# Patient Record
Sex: Male | Born: 1955 | Race: White | Hispanic: No | Marital: Married | Smoking: Current every day smoker
Health system: Southern US, Community
[De-identification: ages and names within clinical notes are randomized; demographics above are authoritative.]

## PROBLEM LIST (undated history)

## (undated) DIAGNOSIS — E7800 Pure hypercholesterolemia, unspecified: Secondary | ICD-10-CM

## (undated) DIAGNOSIS — G4700 Insomnia, unspecified: Secondary | ICD-10-CM

## (undated) DIAGNOSIS — I10 Essential (primary) hypertension: Secondary | ICD-10-CM

## (undated) DIAGNOSIS — M549 Dorsalgia, unspecified: Secondary | ICD-10-CM

## (undated) DIAGNOSIS — F32A Depression, unspecified: Secondary | ICD-10-CM

## (undated) DIAGNOSIS — F329 Major depressive disorder, single episode, unspecified: Secondary | ICD-10-CM

## (undated) DIAGNOSIS — T8859XA Other complications of anesthesia, initial encounter: Secondary | ICD-10-CM

## (undated) DIAGNOSIS — K219 Gastro-esophageal reflux disease without esophagitis: Secondary | ICD-10-CM

## (undated) DIAGNOSIS — C61 Malignant neoplasm of prostate: Secondary | ICD-10-CM

## (undated) DIAGNOSIS — J189 Pneumonia, unspecified organism: Secondary | ICD-10-CM

## (undated) DIAGNOSIS — R51 Headache: Secondary | ICD-10-CM

## (undated) DIAGNOSIS — J449 Chronic obstructive pulmonary disease, unspecified: Secondary | ICD-10-CM

## (undated) DIAGNOSIS — M1990 Unspecified osteoarthritis, unspecified site: Secondary | ICD-10-CM

## (undated) DIAGNOSIS — R519 Headache, unspecified: Secondary | ICD-10-CM

## (undated) DIAGNOSIS — K589 Irritable bowel syndrome without diarrhea: Secondary | ICD-10-CM

## (undated) HISTORY — PX: CYSTECTOMY: SUR359

## (undated) HISTORY — DX: Pure hypercholesterolemia, unspecified: E78.00

## (undated) HISTORY — DX: Essential (primary) hypertension: I10

## (undated) HISTORY — PX: CARPAL TUNNEL RELEASE: SHX101

---

## 1999-01-30 ENCOUNTER — Other Ambulatory Visit: Admission: RE | Admit: 1999-01-30 | Discharge: 1999-01-30 | Payer: Self-pay | Admitting: Otolaryngology

## 2002-07-05 ENCOUNTER — Ambulatory Visit (HOSPITAL_COMMUNITY): Admission: RE | Admit: 2002-07-05 | Discharge: 2002-07-05 | Payer: Self-pay | Admitting: Family Medicine

## 2002-07-05 ENCOUNTER — Encounter: Payer: Self-pay | Admitting: Family Medicine

## 2003-02-22 ENCOUNTER — Ambulatory Visit (HOSPITAL_BASED_OUTPATIENT_CLINIC_OR_DEPARTMENT_OTHER): Admission: RE | Admit: 2003-02-22 | Discharge: 2003-02-22 | Payer: Self-pay | Admitting: Orthopedic Surgery

## 2004-10-15 ENCOUNTER — Ambulatory Visit (HOSPITAL_COMMUNITY): Admission: RE | Admit: 2004-10-15 | Discharge: 2004-10-15 | Payer: Self-pay | Admitting: Family Medicine

## 2004-11-20 ENCOUNTER — Ambulatory Visit (HOSPITAL_COMMUNITY): Admission: RE | Admit: 2004-11-20 | Discharge: 2004-11-20 | Payer: Self-pay | Admitting: Family Medicine

## 2009-02-09 ENCOUNTER — Ambulatory Visit (HOSPITAL_COMMUNITY): Admission: RE | Admit: 2009-02-09 | Discharge: 2009-02-09 | Payer: Self-pay | Admitting: Family Medicine

## 2010-03-21 ENCOUNTER — Ambulatory Visit (HOSPITAL_COMMUNITY): Admission: RE | Admit: 2010-03-21 | Discharge: 2010-03-21 | Payer: Self-pay | Admitting: Family Medicine

## 2010-04-03 ENCOUNTER — Ambulatory Visit (HOSPITAL_COMMUNITY): Admission: RE | Admit: 2010-04-03 | Discharge: 2010-04-03 | Payer: Self-pay | Admitting: Family Medicine

## 2010-08-02 ENCOUNTER — Ambulatory Visit (HOSPITAL_COMMUNITY): Admission: RE | Admit: 2010-08-02 | Discharge: 2010-08-02 | Payer: Self-pay | Admitting: Family Medicine

## 2011-01-28 ENCOUNTER — Institutional Professional Consult (permissible substitution): Payer: Self-pay | Admitting: Pulmonary Disease

## 2011-02-21 NOTE — Op Note (Signed)
   NAME:  [name], Roger Phillips                         ACCOUNT NO.:  [id number]   MEDICAL RECORD NO.:  [id number]                   PATIENT TYPE:  AMB   LOCATION:  DSC                                  FACILITY:  [facility]   PHYSICIAN:  Artist Pais. Mina Marble, M.D.           DATE OF BIRTH:  07/28/1956   DATE OF PROCEDURE:  02/22/2003  DATE OF DISCHARGE:                                 OPERATIVE REPORT   PREOPERATIVE DIAGNOSIS:  Right carpal tunnel syndrome.   POSTOPERATIVE DIAGNOSIS:  Right carpal tunnel syndrome.   PROCEDURE:  Right carpal tunnel release.   SURGEON:  Artist Pais. Mina Marble, M.D.   ASSISTANT:  Aura Fey. Bobbe Medico.   ANESTHESIA:  General anesthesia.   TOURNIQUET TIME:  11 minutes.   COMPLICATIONS:  None.   DRAINS:  None.   DESCRIPTION OF PROCEDURE:  The patient was taken to the operating room after  the induction of adequate general anesthesia, the right upper extremity was  prepped and draped in the usual sterile fashion.  Esmarch was used to  exsanguinate the limb.  Tourniquet was then inflated to 250 mmHg.  At this  point in time, a 2 cm incision was made in the palmar aspect of the right  hand in line with the long finger metacarpal, starting at Captain's cardinal  line.  Incision was taken down through the skin and subcutaneous tissues.  The palmar fascia was identified and split.  The distal edge of the  transverse carpal ligament as well as superficial palmar arch was  identified.  The superficial palmar arch was retracted distally and the  distal edge of the transverse carpal ligament was divided with the 15 blade,  thus exposing the underlying the median nerve and __________ of the carpal  canal.  A freer elevator was then used to __________ dorsal and volar  through the remaining aspect of the transverse carpal ligament which was  divided under direct vision.  The canal was inspected.  There were no osseus  lesions or ganglions present.  The wound was then loosely  closed with a  running 3-0 Prolene subcuticular stitch.  Steri-Strips, 4x4s and fluffs and  compressive hand dressing were applied.  The patient tolerated the procedure  well and went to the recovery room in stable condition.                                               Artist Pais Mina Marble, M.D.    MAW/MEDQ  D:  02/22/2003  T:  02/22/2003  Job:  [id number]

## 2011-05-05 ENCOUNTER — Other Ambulatory Visit: Payer: Self-pay | Admitting: Family Medicine

## 2011-05-05 ENCOUNTER — Ambulatory Visit (HOSPITAL_COMMUNITY)
Admission: RE | Admit: 2011-05-05 | Discharge: 2011-05-05 | Disposition: A | Discharge status: Home / Self Care | Payer: PRIVATE HEALTH INSURANCE | Source: Ambulatory Visit | Attending: Family Medicine | Admitting: Family Medicine

## 2011-05-05 DIAGNOSIS — J4489 Other specified chronic obstructive pulmonary disease: Secondary | ICD-10-CM | POA: Insufficient documentation

## 2011-05-05 DIAGNOSIS — J449 Chronic obstructive pulmonary disease, unspecified: Secondary | ICD-10-CM

## 2011-05-05 DIAGNOSIS — R0602 Shortness of breath: Secondary | ICD-10-CM | POA: Insufficient documentation

## 2011-05-08 ENCOUNTER — Encounter: Payer: Self-pay | Admitting: Pulmonary Disease

## 2011-05-08 ENCOUNTER — Ambulatory Visit (INDEPENDENT_AMBULATORY_CARE_PROVIDER_SITE_OTHER): Payer: PRIVATE HEALTH INSURANCE | Admitting: Pulmonary Disease

## 2011-05-08 VITALS — BP 130/80 | HR 73 | Temp 98.5°F | Ht 72.0 in | Wt 232.2 lb

## 2011-05-08 DIAGNOSIS — R042 Hemoptysis: Secondary | ICD-10-CM

## 2011-05-08 DIAGNOSIS — Z720 Tobacco use: Secondary | ICD-10-CM

## 2011-05-08 DIAGNOSIS — J984 Other disorders of lung: Secondary | ICD-10-CM

## 2011-05-08 DIAGNOSIS — R911 Solitary pulmonary nodule: Secondary | ICD-10-CM

## 2011-05-08 DIAGNOSIS — R059 Cough, unspecified: Secondary | ICD-10-CM

## 2011-05-08 DIAGNOSIS — R05 Cough: Secondary | ICD-10-CM

## 2011-05-08 DIAGNOSIS — F17200 Nicotine dependence, unspecified, uncomplicated: Secondary | ICD-10-CM

## 2011-05-08 NOTE — Progress Notes (Signed)
  Subjective:    Patient ID: Roger Phillips, male    DOB: 01-25-1956, 55 y.o.   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 Accomapnied by wife, [name] who is CNA at [employer]. Pulm nodules & 1.2 cm subcarinal LN were first noted on a CT scan in 6/11. ON a FU CT in oct'11, the LN had decresaed in size & the LUL nodule had resolved, others were stable. He reports blood tinged sputum on occasion over the past few months, last such episode 2 wks ago. His brother had throat cancer, he denies change in voice, hoarseness or sensation of a lump. He has been on adviar & ventolin, spiriva was recetly added, completed course of cipro & prednisone. With chantix, he smokes 1 PPd, down from 1.5 ppD Reports breathing heavy, worse in the heat, walking in the parking lot. Coughs all the time , mostly yellow, occ brown He binge drinks on the weekends & hemoptysis is worse after this. Spirometry showed FEv1 53% (2.1) & FVC of 55%, ratio 75. CXR /30/12 was wnl CBC showed polycythemia with Hb 17   Review of Systems  Constitutional: Positive for unexpected weight change. Negative for fever.  HENT: Positive for congestion and dental problem. Negative for ear pain, nosebleeds, sore throat, rhinorrhea, sneezing, trouble swallowing, postnasal drip and sinus pressure.   Eyes: Negative for redness and itching.  Respiratory: Positive for cough and shortness of breath. Negative for chest tightness and wheezing.   Cardiovascular: Positive for chest pain and leg swelling. Negative for palpitations.  Gastrointestinal: Negative for nausea and vomiting.  Genitourinary: Negative for dysuria.  Musculoskeletal: Negative for joint swelling.  Skin: Negative for rash.  Neurological: Negative for headaches.  Hematological: Does not bruise/bleed easily.  Psychiatric/Behavioral: Negative for dysphoric mood. The patient is not nervous/anxious.        Objective:    Physical Exam Gen. Pleasant, ruddy face, in no distress, normal affect, deep voice ENT - no lesions, no post nasal drip, no oral cause of bleeding, edentulous Neck: No JVD, no thyromegaly, no carotid bruits Lungs: no use of accessory muscles, no dullness to percussion, decreased without rales or rhonchi  Cardiovascular: Rhythm regular, heart sounds  normal, no murmurs or gallops, no peripheral edema Abdomen: soft and non-tender, no hepatosplenomegaly, BS normal. Musculoskeletal: No deformities, no cyanosis or clubbing Neuro:  alert, non focal        Assessment & Plan:

## 2011-05-08 NOTE — Patient Instructions (Signed)
Breathing test Ct scan of chest at [facility] Bronchoscopy next week - nothing to eat after MN the night before

## 2011-05-09 ENCOUNTER — Ambulatory Visit (HOSPITAL_COMMUNITY)
Admission: RE | Admit: 2011-05-09 | Discharge: 2011-05-09 | Disposition: A | Discharge status: Home / Self Care | Payer: PRIVATE HEALTH INSURANCE | Source: Ambulatory Visit | Attending: Pulmonary Disease | Admitting: Pulmonary Disease

## 2011-05-09 DIAGNOSIS — Z720 Tobacco use: Secondary | ICD-10-CM | POA: Insufficient documentation

## 2011-05-09 DIAGNOSIS — R911 Solitary pulmonary nodule: Secondary | ICD-10-CM | POA: Insufficient documentation

## 2011-05-09 DIAGNOSIS — J984 Other disorders of lung: Secondary | ICD-10-CM | POA: Insufficient documentation

## 2011-05-09 DIAGNOSIS — R042 Hemoptysis: Secondary | ICD-10-CM | POA: Insufficient documentation

## 2011-05-09 DIAGNOSIS — R05 Cough: Secondary | ICD-10-CM

## 2011-05-09 NOTE — Assessment & Plan Note (Signed)
Needless to say, smoking cessation paramount intervention here.

## 2011-05-09 NOTE — Assessment & Plan Note (Signed)
Rpt CT chest for stability May need FU x 2 yrs before concluding benign. Sub carinal Ln from 6/11 decreased on FU in 10/11- reassuring.

## 2011-05-09 NOTE — Assessment & Plan Note (Signed)
No obvious upper airway source. Will proceed with inspection bscopy after CT, planned for next week.

## 2011-05-13 ENCOUNTER — Ambulatory Visit (HOSPITAL_COMMUNITY)
Admission: RE | Admit: 2011-05-13 | Discharge: 2011-05-13 | Disposition: A | Discharge status: Home / Self Care | Payer: PRIVATE HEALTH INSURANCE | Source: Ambulatory Visit | Attending: Pulmonary Disease | Admitting: Pulmonary Disease

## 2011-05-13 DIAGNOSIS — R042 Hemoptysis: Secondary | ICD-10-CM | POA: Insufficient documentation

## 2011-05-13 DIAGNOSIS — J984 Other disorders of lung: Secondary | ICD-10-CM | POA: Insufficient documentation

## 2011-05-13 DIAGNOSIS — F17200 Nicotine dependence, unspecified, uncomplicated: Secondary | ICD-10-CM | POA: Insufficient documentation

## 2011-05-18 NOTE — Op Note (Signed)
  NAMEBUCK, MCAFFEE NO.:  [id number]  MEDICAL RECORD NO.:  [id number]  LOCATION:  RESP                         FACILITY:  [facility]  PHYSICIAN:  Oretha Milch, MD      DATE OF BIRTH:  03/03/1956  DATE OF PROCEDURE:  05/13/2011 DATE OF DISCHARGE:                              OPERATIVE REPORT   PROCEDURE PERFORMED:  Bronchoscopy.  INDICATION FOR THE PROCEDURE:  Hemoptysis.  PROCEDURE NOTE:  Written informed consent was obtained from the patient prior to procedure.  Risks of procedure including coughing, bleeding, and a small chance of lung puncture requiring a chest tube were discussed with the patient in detail and he evidenced understanding.  Versed 5 mg and 125 mcg of fentanyl were used in divided doses.  During the procedure, bronchoscope was inserted from the right naris.  Upper airway appeared normal.  Vocal cords showed normal appearance and motion.  Tracheobronchial tree was examined to the subsegmental level. The mucosa appeared mildly inflamed.  No source of hemoptysis was identified.  No endobronchial lesions were noted.  The bronchoscope was then withdrawn and reinserted through the left naris.  No source of hemoptysis was noted.  Nasal bleeding was noted here to.  The patient tolerated procedure well.  10 cc of 2% lidocaine and 8 cc of 1% lidocaine were used during the procedure.     Oretha Milch, MD     RVA/MEDQ  D:  05/13/2011  T:  05/13/2011  Job:  [id number]  Electronically Signed by Cyril Mourning MD on 05/18/2011 09:33:22 PM

## 2011-05-23 ENCOUNTER — Ambulatory Visit: Payer: PRIVATE HEALTH INSURANCE | Admitting: Pulmonary Disease

## 2011-06-16 ENCOUNTER — Ambulatory Visit: Payer: PRIVATE HEALTH INSURANCE | Admitting: Pulmonary Disease

## 2011-07-04 ENCOUNTER — Ambulatory Visit (INDEPENDENT_AMBULATORY_CARE_PROVIDER_SITE_OTHER): Payer: PRIVATE HEALTH INSURANCE | Admitting: Pulmonary Disease

## 2011-07-04 ENCOUNTER — Encounter: Payer: Self-pay | Admitting: Pulmonary Disease

## 2011-07-04 VITALS — BP 132/90 | HR 65 | Temp 98.1°F | Ht 72.0 in | Wt 235.0 lb

## 2011-07-04 DIAGNOSIS — Z23 Encounter for immunization: Secondary | ICD-10-CM

## 2011-07-04 DIAGNOSIS — J984 Other disorders of lung: Secondary | ICD-10-CM

## 2011-07-04 DIAGNOSIS — J449 Chronic obstructive pulmonary disease, unspecified: Secondary | ICD-10-CM

## 2011-07-04 DIAGNOSIS — R911 Solitary pulmonary nodule: Secondary | ICD-10-CM

## 2011-07-04 DIAGNOSIS — R042 Hemoptysis: Secondary | ICD-10-CM

## 2011-07-04 NOTE — Progress Notes (Signed)
  Subjective:    Patient ID: Roger Phillips, male    DOB: March 06, 1956, 55 y.o.   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  Accompanied by wife, Roger Phillips who is CNA at [employer].    He reported blood tinged sputum on occasion over the past few months, last such episode 2 wks ago. His brother had throat cancer, he denies change in voice, hoarseness or sensation of a lump.  He has been on adviar & ventolin, spiriva was recetly added, completed course of cipro & prednisone.  With chantix, he smokes 1 PPd, down from 1.5 ppD  He binge drinks on the weekends & hemoptysis is worse after this.  Spirometry showed FEv1 53% (2.1) & FVC of 55%, ratio 75.  CXR /30/12 was wnl  CBC showed polycythemia with Hb 17 Bscopy - no endobronchial lesions Pulm nodules & 1.2 cm subcarinal LN were first noted on a CT scan in 6/11. ON a FU CT in oct'11, the LN had decresaed in size & the LUL nodule had resolved, others were stable. CT 8/12 >>Previously seen right upper lobe pulmonary nodule is slightly less conspicuous, 4 mm current versus 5 mm previous. Additional tiny questionable nodular foci in both lungs are also  unchanged, with two additional tiny questionable nodules at right upper right lower lobes not definitely seen on previous study  07/04/2011 Continues to smoke 1 PPD, now off chantix. Discussed nicotine supplements. Discussed bscopy  & CT scan results   Review of Systems Pt denies any significant  nasal congestion or excess secretions, fever, chills, sweats, unintended wt loss, pleuritic or exertional cp, orthopnea pnd or leg swelling.  Pt also denies any obvious fluctuation in symptoms with weather or environmental change or other alleviating or aggravating factors.    Pt denies any increase in rescue therapy over baseline, denies waking up needing it or having early am exacerbations or coughing/wheezing/ or dyspnea      Objective:   Physical  Exam  Gen. Pleasant, well-nourished, in no distress, normal affect ENT - no lesions, no post nasal drip Neck: No JVD, no thyromegaly, no carotid bruits Lungs: no use of accessory muscles, no dullness to percussion,decreased without rales or rhonchi  Cardiovascular: Rhythm regular, heart sounds  normal, no murmurs or gallops, no peripheral edema Abdomen: soft and non-tender, no hepatosplenomegaly, BS normal. Musculoskeletal: No deformities, no cyanosis or clubbing Neuro:  alert, non focal       Assessment & Plan:

## 2011-07-04 NOTE — Assessment & Plan Note (Signed)
Ct advair,spiriva Albuterol prn only

## 2011-07-04 NOTE — Patient Instructions (Signed)
You HAVE to quit smoking !! Try nicotine patches 21 mg daily CT scan in 6 months to follow up on spot in your lung Flu shot

## 2011-07-04 NOTE — Assessment & Plan Note (Signed)
FU CT in 6mnths 

## 2011-10-30 ENCOUNTER — Other Ambulatory Visit: Payer: Self-pay | Admitting: Family Medicine

## 2011-10-30 DIAGNOSIS — R29898 Other symptoms and signs involving the musculoskeletal system: Secondary | ICD-10-CM

## 2011-11-06 ENCOUNTER — Ambulatory Visit (HOSPITAL_COMMUNITY)
Admission: RE | Admit: 2011-11-06 | Discharge: 2011-11-06 | Disposition: A | Discharge status: Home / Self Care | Payer: PRIVATE HEALTH INSURANCE | Source: Ambulatory Visit | Attending: Family Medicine | Admitting: Family Medicine

## 2011-11-06 DIAGNOSIS — M542 Cervicalgia: Secondary | ICD-10-CM | POA: Insufficient documentation

## 2011-11-06 DIAGNOSIS — R29898 Other symptoms and signs involving the musculoskeletal system: Secondary | ICD-10-CM

## 2011-11-06 DIAGNOSIS — M79609 Pain in unspecified limb: Secondary | ICD-10-CM | POA: Insufficient documentation

## 2011-11-06 DIAGNOSIS — M5020 Other cervical disc displacement, unspecified cervical region: Secondary | ICD-10-CM | POA: Insufficient documentation

## 2011-11-06 DIAGNOSIS — M5380 Other specified dorsopathies, site unspecified: Secondary | ICD-10-CM | POA: Insufficient documentation

## 2011-11-27 ENCOUNTER — Telehealth: Payer: Self-pay | Admitting: Pulmonary Disease

## 2011-11-27 ENCOUNTER — Other Ambulatory Visit: Payer: Self-pay | Admitting: Neurosurgery

## 2011-11-27 NOTE — Telephone Encounter (Signed)
lmomtcb x1 

## 2011-11-27 NOTE — Telephone Encounter (Signed)
Called and spoke with Darl Pikes from with Dr. Antony Contras office.  She states pt is needing surgical clearance.  Pt is scheduled next week 2/28 for two level cervical fusion on general anesthesia.  Pt last saw RA 07/04/11.  RA, please advise if ok for surgery or does pt need to be seen in our office first prior to surgery?  Please advise.  Thanks!

## 2011-11-27 NOTE — Telephone Encounter (Signed)
Pl arrange OV with me next week for pre-op clearance - ok to double book

## 2011-11-28 NOTE — Telephone Encounter (Signed)
Dr. Vassie Loll has an opening on 12/02/11 at 2:30 at [facility] office -- Spoke with Darl Pikes with Dr. Antony Contras office.  She is ok with this appt date and time and will inform pt.  Nothing further needed.

## 2011-12-01 ENCOUNTER — Encounter (HOSPITAL_COMMUNITY): Payer: Self-pay | Admitting: Pharmacy Technician

## 2011-12-02 ENCOUNTER — Encounter: Payer: Self-pay | Admitting: Pulmonary Disease

## 2011-12-02 ENCOUNTER — Ambulatory Visit (INDEPENDENT_AMBULATORY_CARE_PROVIDER_SITE_OTHER): Payer: PRIVATE HEALTH INSURANCE | Admitting: Pulmonary Disease

## 2011-12-02 ENCOUNTER — Ambulatory Visit (INDEPENDENT_AMBULATORY_CARE_PROVIDER_SITE_OTHER)
Admission: RE | Admit: 2011-12-02 | Discharge: 2011-12-02 | Disposition: A | Discharge status: Home / Self Care | Payer: PRIVATE HEALTH INSURANCE | Source: Ambulatory Visit | Attending: Pulmonary Disease | Admitting: Pulmonary Disease

## 2011-12-02 VITALS — BP 142/90 | HR 81 | Temp 98.8°F | Ht 72.0 in | Wt 244.2 lb

## 2011-12-02 DIAGNOSIS — R911 Solitary pulmonary nodule: Secondary | ICD-10-CM

## 2011-12-02 DIAGNOSIS — R05 Cough: Secondary | ICD-10-CM

## 2011-12-02 DIAGNOSIS — J449 Chronic obstructive pulmonary disease, unspecified: Secondary | ICD-10-CM

## 2011-12-02 NOTE — Progress Notes (Signed)
  Subjective:    Patient ID: Roger Phillips, male    DOB: [birth year], 56 y.o.   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  Accompanied by wife, Roger Phillips who is CNA at [employer].  He reported blood tinged sputum on occasion over the past few months, last such episode 2 wks ago. His brother had throat cancer, he denies change in voice, hoarseness or sensation of a lump.  He has been on adviar & ventolin, spiriva was recetly added, completed course of cipro & prednisone.  With chantix, he smokes 1 PPd, down from 1.5 ppD  He binge drinks on the weekends & hemoptysis is worse after this.  Spirometry showed FEv1 53% (2.1) & FVC of 55%, ratio 75.  CXR /30/12 was wnl  CBC showed polycythemia with Hb 17  Bscopy - no endobronchial lesions  Pulm nodules & 1.2 cm subcarinal LN were first noted on a CT scan in 6/11. ON a FU CT in oct'11, the LN had decresaed in size & the LUL nodule had resolved, others were stable.  CT 8/12 >>Previously seen right upper lobe pulmonary nodule is slightly less conspicuous, 4 mm current versus 5 mm previous. Additional tiny questionable nodular foci in both lungs are also  unchanged, with two additional tiny questionable nodules at right upper right lower lobes not definitely seen on previous study   12/02/2011 Pre-op clearance for cervical spine decompression surgery 2/28 - Dr Newell Coral He repors sinus drainage - was given z-pak & hydromet cough syrup by PCP. He has self dc'd spiriva Continues to smoke 1/2 PPD, has cut down on alcohol - no h/s/o withdrawal No nocturnal wheeze, chest pain, orthopnea or PND Reports clear phlegm CXR - no new changes    Review of Systems Patient denies significant dyspnea,cough, hemoptysis,  chest pain, palpitations, pedal edema, orthopnea, paroxysmal nocturnal dyspnea, lightheadedness, nausea, vomiting, abdominal or  leg pains      Objective:   Physical Exam  Gen. Pleasant,  well-nourished, in no distress, normal affect ENT - no lesions, no post nasal drip, clear nasal discharge Neck: No JVD, no thyromegaly, no carotid bruits Lungs: no use of accessory muscles, no dullness to percussion, decreased without rales or rhonchi  Cardiovascular: Rhythm regular, heart sounds  normal, no murmurs or gallops, no peripheral edema Abdomen: soft and non-tender, no hepatosplenomegaly, BS normal. Musculoskeletal: No deformities, no cyanosis or clubbing Neuro:  alert, non focal       Assessment & Plan:

## 2011-12-02 NOTE — Assessment & Plan Note (Signed)
Rpt CT planned for 3/13

## 2011-12-02 NOTE — Assessment & Plan Note (Signed)
He is cleared for surgery - remains at moderate risk for post op pulmonary complications such as atelectasis, hypoxia & pneumonia Nasonex sample - 1 spray each nare  At bedtime Stay on advair until day of surgery Hold lisinopril 1 day prior & until after surgery Place on albuterol/ atrovent nebs q 6h & q 4h prn post -op Early mobilisation & DVT prophylaxis as would be routine for such surgery. He should be watched for alcohol withdrawal post op although he denies ever having such an episode.

## 2011-12-02 NOTE — Patient Instructions (Signed)
Pre-op CXR today Nasonex sample - 1 spray each nare  At bedtime Stay on advair until day of surgery Will place on nebuliser medications post -op

## 2011-12-03 ENCOUNTER — Telehealth: Payer: Self-pay | Admitting: Pulmonary Disease

## 2011-12-03 ENCOUNTER — Encounter (HOSPITAL_COMMUNITY)
Admission: RE | Admit: 2011-12-03 | Discharge: 2011-12-03 | Disposition: A | Discharge status: Home / Self Care | Payer: PRIVATE HEALTH INSURANCE | Source: Ambulatory Visit | Attending: Neurosurgery | Admitting: Neurosurgery

## 2011-12-03 ENCOUNTER — Other Ambulatory Visit: Payer: Self-pay | Admitting: *Deleted

## 2011-12-03 ENCOUNTER — Other Ambulatory Visit: Payer: Self-pay

## 2011-12-03 ENCOUNTER — Encounter (HOSPITAL_COMMUNITY): Payer: Self-pay

## 2011-12-03 DIAGNOSIS — J449 Chronic obstructive pulmonary disease, unspecified: Secondary | ICD-10-CM

## 2011-12-03 HISTORY — DX: Pneumonia, unspecified organism: J18.9

## 2011-12-03 HISTORY — DX: Insomnia, unspecified: G47.00

## 2011-12-03 HISTORY — DX: Irritable bowel syndrome, unspecified: K58.9

## 2011-12-03 HISTORY — DX: Gastro-esophageal reflux disease without esophagitis: K21.9

## 2011-12-03 HISTORY — DX: Unspecified osteoarthritis, unspecified site: M19.90

## 2011-12-03 HISTORY — DX: Depression, unspecified: F32.A

## 2011-12-03 HISTORY — DX: Major depressive disorder, single episode, unspecified: F32.9

## 2011-12-03 HISTORY — DX: Dorsalgia, unspecified: M54.9

## 2011-12-03 HISTORY — DX: Headache: R51

## 2011-12-03 HISTORY — DX: Headache, unspecified: R51.9

## 2011-12-03 HISTORY — DX: Chronic obstructive pulmonary disease, unspecified: J44.9

## 2011-12-03 LAB — CBC
Hemoglobin: 16.7 g/dL (ref 13.0–17.0)
MCH: 31 pg (ref 26.0–34.0)
MCV: 86.6 fL (ref 78.0–100.0)
RBC: 5.38 MIL/uL (ref 4.22–5.81)

## 2011-12-03 LAB — BASIC METABOLIC PANEL
CO2: 28 mEq/L (ref 19–32)
Calcium: 9.7 mg/dL (ref 8.4–10.5)
Glucose, Bld: 133 mg/dL — ABNORMAL HIGH (ref 70–99)
Potassium: 3.8 mEq/L (ref 3.5–5.1)
Sodium: 136 mEq/L (ref 135–145)

## 2011-12-03 LAB — SURGICAL PCR SCREEN
MRSA, PCR: NEGATIVE
Staphylococcus aureus: NEGATIVE

## 2011-12-03 MED ORDER — ALBUTEROL SULFATE (2.5 MG/3ML) 0.083% IN NEBU: 2.5000 mg | INHALATION_SOLUTION | RESPIRATORY_TRACT | Status: DC | PRN

## 2011-12-03 MED ORDER — CEFAZOLIN SODIUM-DEXTROSE 2-3 GM-% IV SOLR
2.0000 g | INTRAVENOUS | Status: AC
  Administered 2011-12-04: 2 g via INTRAVENOUS
  Filled 2011-12-03: qty 50

## 2011-12-03 MED ORDER — IPRATROPIUM-ALBUTEROL 0.5-2.5 (3) MG/3ML IN SOLN: 3.0000 mL | Freq: Four times a day (QID) | RESPIRATORY_TRACT | Status: DC

## 2011-12-03 NOTE — Telephone Encounter (Signed)
Asked MR to clarify RA's neb medication from 12/02/11 note and he advised neb medication is for DuoNeb every 6 hours on schedule and then albuterol every 4 hour prn. Rx sent to pharmacy.  Pt aware, pharmacist aware. Pt has neb machine and note faxed to Dr. Mearl Latin office attn Darl Pikes [phone], [phone].

## 2011-12-03 NOTE — Telephone Encounter (Signed)
Medication has been sent in, pt and pharmacist aware.

## 2011-12-03 NOTE — Pre-Procedure Instructions (Signed)
20 Roger Phillips  12/03/2011   Your procedure is scheduled on:  Thurs, Feb 28 @ 0730  Report to [facility] at 0530 AM.  Call this number if you have problems the morning of surgery: [phone]   Remember:   Do not eat food:After Midnight.  May have clear liquids: up to 4 Hours before arrival.(until 1:30 am)  Clear liquids include soda, tea, black coffee, apple or grape juice, broth.  Take these medicines the morning of surgery with A SIP OF WATER: Amlodipine,Wellbutrin,Prilosec,Pain Pill(if needed),Albuterol and Spiriva<Bring your inhalers with you>   Do not wear jewelry, make-up or nail polish.  Do not wear lotions, powders, or perfumes. You may wear deodorant.  Do not shave 48 hours prior to surgery.  Do not bring valuables to the hospital.  Contacts, dentures or bridgework may not be worn into surgery.  Leave suitcase in the car. After surgery it may be brought to your room.  For patients admitted to the hospital, checkout time is 11:00 AM the day of discharge.   Patients discharged the day of surgery will not be allowed to drive home.  Name and phone number of your driver:   Special Instructions: CHG Shower Use Special Wash: 1/2 bottle night before surgery and 1/2 bottle morning of surgery.   Please read over the following fact sheets that you were given: Pain Booklet, Coughing and Deep Breathing, MRSA Information and Surgical Site Infection Prevention

## 2011-12-03 NOTE — Progress Notes (Signed)
Pt doesn't have a cardiologist;medical MD-Dr.Scott Luking manages HTN and HYperlipidemia  Denies having a stress test/echo/heart cath

## 2011-12-04 ENCOUNTER — Ambulatory Visit (HOSPITAL_COMMUNITY)
Admission: RE | Admit: 2011-12-04 | Discharge: 2011-12-05 | Disposition: A | Discharge status: Home / Self Care | Payer: PRIVATE HEALTH INSURANCE | Source: Ambulatory Visit | Attending: Neurosurgery | Admitting: Neurosurgery

## 2011-12-04 ENCOUNTER — Ambulatory Visit (HOSPITAL_COMMUNITY): Payer: PRIVATE HEALTH INSURANCE | Admitting: Anesthesiology

## 2011-12-04 ENCOUNTER — Encounter (HOSPITAL_COMMUNITY): Payer: Self-pay | Admitting: Anesthesiology

## 2011-12-04 ENCOUNTER — Encounter (HOSPITAL_COMMUNITY)
Admission: RE | Disposition: A | Discharge status: Home / Self Care | Payer: Self-pay | Source: Ambulatory Visit | Attending: Neurosurgery

## 2011-12-04 ENCOUNTER — Ambulatory Visit (HOSPITAL_COMMUNITY): Payer: PRIVATE HEALTH INSURANCE

## 2011-12-04 DIAGNOSIS — K219 Gastro-esophageal reflux disease without esophagitis: Secondary | ICD-10-CM | POA: Insufficient documentation

## 2011-12-04 DIAGNOSIS — Z01810 Encounter for preprocedural cardiovascular examination: Secondary | ICD-10-CM | POA: Insufficient documentation

## 2011-12-04 DIAGNOSIS — M4712 Other spondylosis with myelopathy, cervical region: Secondary | ICD-10-CM | POA: Insufficient documentation

## 2011-12-04 DIAGNOSIS — F17200 Nicotine dependence, unspecified, uncomplicated: Secondary | ICD-10-CM | POA: Insufficient documentation

## 2011-12-04 DIAGNOSIS — Z79899 Other long term (current) drug therapy: Secondary | ICD-10-CM | POA: Insufficient documentation

## 2011-12-04 DIAGNOSIS — Z01812 Encounter for preprocedural laboratory examination: Secondary | ICD-10-CM | POA: Insufficient documentation

## 2011-12-04 DIAGNOSIS — M5000 Cervical disc disorder with myelopathy, unspecified cervical region: Secondary | ICD-10-CM | POA: Insufficient documentation

## 2011-12-04 DIAGNOSIS — E7800 Pure hypercholesterolemia, unspecified: Secondary | ICD-10-CM | POA: Insufficient documentation

## 2011-12-04 DIAGNOSIS — J4489 Other specified chronic obstructive pulmonary disease: Secondary | ICD-10-CM | POA: Insufficient documentation

## 2011-12-04 DIAGNOSIS — I10 Essential (primary) hypertension: Secondary | ICD-10-CM | POA: Insufficient documentation

## 2011-12-04 DIAGNOSIS — J449 Chronic obstructive pulmonary disease, unspecified: Secondary | ICD-10-CM | POA: Insufficient documentation

## 2011-12-04 DIAGNOSIS — E119 Type 2 diabetes mellitus without complications: Secondary | ICD-10-CM | POA: Insufficient documentation

## 2011-12-04 HISTORY — PX: ANTERIOR CERVICAL DECOMP/DISCECTOMY FUSION: SHX1161

## 2011-12-04 LAB — GLUCOSE, CAPILLARY
Glucose-Capillary: 140 mg/dL — ABNORMAL HIGH (ref 70–99)
Glucose-Capillary: 171 mg/dL — ABNORMAL HIGH (ref 70–99)
Glucose-Capillary: 182 mg/dL — ABNORMAL HIGH (ref 70–99)
Glucose-Capillary: 189 mg/dL — ABNORMAL HIGH (ref 70–99)
Glucose-Capillary: 202 mg/dL — ABNORMAL HIGH (ref 70–99)

## 2011-12-04 SURGERY — ANTERIOR CERVICAL DECOMPRESSION/DISCECTOMY FUSION 2 LEVELS
Anesthesia: General | Site: Neck | Wound class: Clean

## 2011-12-04 MED ORDER — ZOLPIDEM TARTRATE 5 MG PO TABS: 10.0000 mg | ORAL_TABLET | Freq: Every evening | ORAL | Status: DC | PRN

## 2011-12-04 MED ORDER — OXYCODONE-ACETAMINOPHEN 5-325 MG PO TABS: 1.0000 | ORAL_TABLET | ORAL | Status: DC | PRN

## 2011-12-04 MED ORDER — DIPHENHYDRAMINE HCL 25 MG PO TABS
25.0000 mg | ORAL_TABLET | Freq: Four times a day (QID) | ORAL | Status: DC
  Filled 2011-12-04 (×8): qty 1

## 2011-12-04 MED ORDER — INSULIN ASPART 100 UNIT/ML ~~LOC~~ SOLN
0.0000 [IU] | SUBCUTANEOUS | Status: DC
  Administered 2011-12-04 – 2011-12-05 (×5): 3 [IU] via SUBCUTANEOUS
  Administered 2011-12-05: 5 [IU] via SUBCUTANEOUS
  Filled 2011-12-04: qty 3

## 2011-12-04 MED ORDER — ALBUTEROL SULFATE HFA 108 (90 BASE) MCG/ACT IN AERS
2.0000 | INHALATION_SPRAY | RESPIRATORY_TRACT | Status: DC | PRN
  Administered 2011-12-04: 5 via RESPIRATORY_TRACT
  Filled 2011-12-04: qty 6.7

## 2011-12-04 MED ORDER — HYDROXYZINE HCL 50 MG/ML IM SOLN: 50.0000 mg | INTRAMUSCULAR | Status: DC | PRN

## 2011-12-04 MED ORDER — SODIUM CHLORIDE 0.9 % IJ SOLN: 3.0000 mL | INTRAMUSCULAR | Status: DC | PRN

## 2011-12-04 MED ORDER — MORPHINE SULFATE 4 MG/ML IJ SOLN: 0.0500 mg/kg | INTRAMUSCULAR | Status: DC | PRN

## 2011-12-04 MED ORDER — ALBUTEROL SULFATE (5 MG/ML) 0.5% IN NEBU
2.5000 mg | INHALATION_SOLUTION | Freq: Four times a day (QID) | RESPIRATORY_TRACT | Status: DC
  Administered 2011-12-05 (×2): 2.5 mg via RESPIRATORY_TRACT
  Filled 2011-12-04 (×2): qty 0.5

## 2011-12-04 MED ORDER — SODIUM CHLORIDE 0.9 % IJ SOLN
3.0000 mL | Freq: Two times a day (BID) | INTRAMUSCULAR | Status: DC
  Administered 2011-12-04: 3 mL via INTRAVENOUS

## 2011-12-04 MED ORDER — SIMVASTATIN 10 MG PO TABS
10.0000 mg | ORAL_TABLET | Freq: Every day | ORAL | Status: DC
  Filled 2011-12-04 (×3): qty 1

## 2011-12-04 MED ORDER — AMLODIPINE BESYLATE 5 MG PO TABS
5.0000 mg | ORAL_TABLET | Freq: Every day | ORAL | Status: DC
  Filled 2011-12-04 (×2): qty 1

## 2011-12-04 MED ORDER — TIOTROPIUM BROMIDE MONOHYDRATE 18 MCG IN CAPS
18.0000 ug | ORAL_CAPSULE | Freq: Every day | RESPIRATORY_TRACT | Status: DC
  Filled 2011-12-04: qty 5

## 2011-12-04 MED ORDER — POTASSIUM CHLORIDE IN NACL 20-0.9 MEQ/L-% IV SOLN
INTRAVENOUS | Status: DC
  Administered 2011-12-04 – 2011-12-05 (×2): via INTRAVENOUS
  Filled 2011-12-04 (×4): qty 1000

## 2011-12-04 MED ORDER — 0.9 % SODIUM CHLORIDE (POUR BTL) OPTIME
TOPICAL | Status: DC | PRN
  Administered 2011-12-04: 1000 mL

## 2011-12-04 MED ORDER — FAMOTIDINE IN NACL 20-0.9 MG/50ML-% IV SOLN
20.0000 mg | Freq: Two times a day (BID) | INTRAVENOUS | Status: DC
  Administered 2011-12-04 – 2011-12-05 (×3): 20 mg via INTRAVENOUS
  Filled 2011-12-04 (×4): qty 50

## 2011-12-04 MED ORDER — NEOSTIGMINE METHYLSULFATE 1 MG/ML IJ SOLN
INTRAMUSCULAR | Status: DC | PRN
  Administered 2011-12-04: 4 mg via INTRAVENOUS

## 2011-12-04 MED ORDER — HYDROMORPHONE HCL PF 1 MG/ML IJ SOLN: 0.2500 mg | INTRAMUSCULAR | Status: DC | PRN

## 2011-12-04 MED ORDER — ONDANSETRON HCL 4 MG/2ML IJ SOLN
INTRAMUSCULAR | Status: DC | PRN
  Administered 2011-12-04: 4 mg via INTRAVENOUS

## 2011-12-04 MED ORDER — ACETAMINOPHEN 650 MG RE SUPP: 650.0000 mg | RECTAL | Status: DC | PRN

## 2011-12-04 MED ORDER — MAGNESIUM HYDROXIDE 400 MG/5ML PO SUSP: 30.0000 mL | Freq: Every day | ORAL | Status: DC | PRN

## 2011-12-04 MED ORDER — MORPHINE SULFATE 4 MG/ML IJ SOLN
4.0000 mg | INTRAMUSCULAR | Status: DC | PRN
  Administered 2011-12-04: 4 mg via INTRAMUSCULAR
  Filled 2011-12-04: qty 1

## 2011-12-04 MED ORDER — HYDROXYZINE HCL 25 MG PO TABS: 50.0000 mg | ORAL_TABLET | ORAL | Status: DC | PRN

## 2011-12-04 MED ORDER — HYDROCODONE-ACETAMINOPHEN 5-325 MG PO TABS: 1.0000 | ORAL_TABLET | ORAL | Status: DC | PRN

## 2011-12-04 MED ORDER — ALUM & MAG HYDROXIDE-SIMETH 200-200-20 MG/5ML PO SUSP: 30.0000 mL | Freq: Four times a day (QID) | ORAL | Status: DC | PRN

## 2011-12-04 MED ORDER — PANTOPRAZOLE SODIUM 40 MG PO TBEC: 40.0000 mg | DELAYED_RELEASE_TABLET | Freq: Every day | ORAL | Status: DC

## 2011-12-04 MED ORDER — BUPIVACAINE HCL (PF) 0.5 % IJ SOLN
INTRAMUSCULAR | Status: DC | PRN
  Administered 2011-12-04: 30 mL

## 2011-12-04 MED ORDER — LIDOCAINE-EPINEPHRINE 1 %-1:100000 IJ SOLN
INTRAMUSCULAR | Status: DC | PRN
  Administered 2011-12-04: 20 mL

## 2011-12-04 MED ORDER — HYDROCHLOROTHIAZIDE 25 MG PO TABS
25.0000 mg | ORAL_TABLET | Freq: Every day | ORAL | Status: DC
  Administered 2011-12-05: 25 mg via ORAL
  Filled 2011-12-04 (×3): qty 1

## 2011-12-04 MED ORDER — DEXAMETHASONE SODIUM PHOSPHATE 4 MG/ML IJ SOLN
INTRAMUSCULAR | Status: DC | PRN
  Administered 2011-12-04: 8 mg via INTRAVENOUS

## 2011-12-04 MED ORDER — ALBUTEROL SULFATE (5 MG/ML) 0.5% IN NEBU
INHALATION_SOLUTION | RESPIRATORY_TRACT | Status: AC
  Filled 2011-12-04: qty 0.5

## 2011-12-04 MED ORDER — BACITRACIN 50000 UNITS IM SOLR
INTRAMUSCULAR | Status: AC
  Filled 2011-12-04: qty 1

## 2011-12-04 MED ORDER — PANTOPRAZOLE SODIUM 40 MG IV SOLR: 40.0000 mg | INTRAVENOUS | Status: DC

## 2011-12-04 MED ORDER — MENTHOL 3 MG MT LOZG: 1.0000 | LOZENGE | OROMUCOSAL | Status: DC | PRN

## 2011-12-04 MED ORDER — IPRATROPIUM BROMIDE 0.02 % IN SOLN
0.5000 mg | Freq: Four times a day (QID) | RESPIRATORY_TRACT | Status: DC
  Administered 2011-12-05 (×2): 0.5 mg via RESPIRATORY_TRACT
  Filled 2011-12-04 (×3): qty 2.5

## 2011-12-04 MED ORDER — MIDAZOLAM HCL 5 MG/5ML IJ SOLN
INTRAMUSCULAR | Status: DC | PRN
  Administered 2011-12-04: 2 mg via INTRAVENOUS

## 2011-12-04 MED ORDER — KETOROLAC TROMETHAMINE 30 MG/ML IJ SOLN: 30.0000 mg | Freq: Once | INTRAMUSCULAR | Status: DC

## 2011-12-04 MED ORDER — LISINOPRIL 5 MG PO TABS
5.0000 mg | ORAL_TABLET | Freq: Every day | ORAL | Status: DC
  Filled 2011-12-04 (×3): qty 1

## 2011-12-04 MED ORDER — ONDANSETRON HCL 4 MG/2ML IJ SOLN: 4.0000 mg | Freq: Once | INTRAMUSCULAR | Status: DC | PRN

## 2011-12-04 MED ORDER — FENTANYL CITRATE 0.05 MG/ML IJ SOLN
INTRAMUSCULAR | Status: DC | PRN
  Administered 2011-12-04 (×2): 100 ug via INTRAVENOUS
  Administered 2011-12-04 (×5): 50 ug via INTRAVENOUS
  Administered 2011-12-04: 100 ug via INTRAVENOUS

## 2011-12-04 MED ORDER — ROCURONIUM BROMIDE 100 MG/10ML IV SOLN
INTRAVENOUS | Status: DC | PRN
  Administered 2011-12-04: 20 mg via INTRAVENOUS
  Administered 2011-12-04 (×2): 10 mg via INTRAVENOUS
  Administered 2011-12-04: 50 mg via INTRAVENOUS
  Administered 2011-12-04 (×2): 10 mg via INTRAVENOUS

## 2011-12-04 MED ORDER — THROMBIN 20000 UNITS EX KIT
PACK | CUTANEOUS | Status: DC | PRN
  Administered 2011-12-04: 10:00:00 via TOPICAL

## 2011-12-04 MED ORDER — FLUTICASONE-SALMETEROL 250-50 MCG/DOSE IN AEPB
1.0000 | INHALATION_SPRAY | Freq: Two times a day (BID) | RESPIRATORY_TRACT | Status: DC
  Administered 2011-12-05: 1 via RESPIRATORY_TRACT
  Filled 2011-12-04: qty 14

## 2011-12-04 MED ORDER — MORPHINE SULFATE 2 MG/ML IJ SOLN: 0.0500 mg/kg | INTRAMUSCULAR | Status: DC | PRN

## 2011-12-04 MED ORDER — ACETAMINOPHEN 325 MG PO TABS: 650.0000 mg | ORAL_TABLET | ORAL | Status: DC | PRN

## 2011-12-04 MED ORDER — SODIUM CHLORIDE 0.9 % IR SOLN
Status: DC | PRN
  Administered 2011-12-04: 09:00:00

## 2011-12-04 MED ORDER — KETOROLAC TROMETHAMINE 30 MG/ML IJ SOLN
30.0000 mg | Freq: Four times a day (QID) | INTRAMUSCULAR | Status: DC
  Administered 2011-12-04 – 2011-12-05 (×5): 30 mg via INTRAVENOUS
  Filled 2011-12-04 (×7): qty 1

## 2011-12-04 MED ORDER — GLYCOPYRROLATE 0.2 MG/ML IJ SOLN
INTRAMUSCULAR | Status: DC | PRN
  Administered 2011-12-04: 6 mg via INTRAVENOUS

## 2011-12-04 MED ORDER — CYCLOBENZAPRINE HCL 10 MG PO TABS: 10.0000 mg | ORAL_TABLET | Freq: Three times a day (TID) | ORAL | Status: DC | PRN

## 2011-12-04 MED ORDER — SODIUM CHLORIDE 0.9 % IV SOLN
INTRAVENOUS | Status: AC
  Filled 2011-12-04: qty 500

## 2011-12-04 MED ORDER — HEMOSTATIC AGENTS (NO CHARGE) OPTIME
TOPICAL | Status: DC | PRN
  Administered 2011-12-04: 1 via TOPICAL

## 2011-12-04 MED ORDER — BISACODYL 10 MG RE SUPP: 10.0000 mg | Freq: Every day | RECTAL | Status: DC | PRN

## 2011-12-04 MED ORDER — LACTATED RINGERS IV SOLN
INTRAVENOUS | Status: DC | PRN
  Administered 2011-12-04 (×3): via INTRAVENOUS

## 2011-12-04 MED ORDER — DIPHENOXYLATE-ATROPINE 2.5-0.025 MG PO TABS: 1.0000 | ORAL_TABLET | Freq: Every day | ORAL | Status: DC

## 2011-12-04 MED ORDER — ALBUTEROL SULFATE (5 MG/ML) 0.5% IN NEBU
2.5000 mg | INHALATION_SOLUTION | RESPIRATORY_TRACT | Status: DC | PRN
  Filled 2011-12-04: qty 0.5

## 2011-12-04 MED ORDER — THROMBIN 5000 UNITS EX KIT
PACK | CUTANEOUS | Status: DC | PRN
  Administered 2011-12-04 (×2): 5000 [IU] via TOPICAL

## 2011-12-04 MED ORDER — PHENOL 1.4 % MT LIQD
1.0000 | OROMUCOSAL | Status: DC | PRN
  Filled 2011-12-04: qty 177

## 2011-12-04 MED ORDER — MEPERIDINE HCL 25 MG/ML IJ SOLN: 6.2500 mg | INTRAMUSCULAR | Status: DC | PRN

## 2011-12-04 MED ORDER — FLUTICASONE PROPIONATE 50 MCG/ACT NA SUSP
1.0000 | Freq: Every day | NASAL | Status: DC
  Administered 2011-12-04: 1 via NASAL
  Filled 2011-12-04: qty 16

## 2011-12-04 MED ORDER — METFORMIN HCL 500 MG PO TABS
500.0000 mg | ORAL_TABLET | Freq: Two times a day (BID) | ORAL | Status: DC
  Filled 2011-12-04 (×6): qty 1

## 2011-12-04 MED ORDER — PROPOFOL 10 MG/ML IV EMUL
INTRAVENOUS | Status: DC | PRN
  Administered 2011-12-04: 20 mg via INTRAVENOUS
  Administered 2011-12-04: 130 mg via INTRAVENOUS
  Administered 2011-12-04: 50 mg via INTRAVENOUS

## 2011-12-04 MED ORDER — HYDROCODONE-HOMATROPINE 5-1.5 MG/5ML PO SYRP: 5.0000 mL | ORAL_SOLUTION | ORAL | Status: DC | PRN

## 2011-12-04 MED ORDER — DEXAMETHASONE SODIUM PHOSPHATE 4 MG/ML IJ SOLN
4.0000 mg | Freq: Four times a day (QID) | INTRAMUSCULAR | Status: AC
  Administered 2011-12-04 – 2011-12-05 (×4): 4 mg via INTRAVENOUS
  Filled 2011-12-04 (×4): qty 1

## 2011-12-04 MED ORDER — BUPROPION HCL ER (SR) 150 MG PO TB12
150.0000 mg | ORAL_TABLET | Freq: Two times a day (BID) | ORAL | Status: DC
  Administered 2011-12-05: 150 mg via ORAL
  Filled 2011-12-04 (×6): qty 1

## 2011-12-04 MED ORDER — KCL IN DEXTROSE-NACL 20-5-0.45 MEQ/L-%-% IV SOLN
INTRAVENOUS | Status: DC
  Administered 2011-12-04: 16:00:00 via INTRAVENOUS
  Filled 2011-12-04 (×3): qty 1000

## 2011-12-04 MED ORDER — IPRATROPIUM-ALBUTEROL 0.5-2.5 (3) MG/3ML IN SOLN: 3.0000 mL | Freq: Four times a day (QID) | RESPIRATORY_TRACT | Status: DC

## 2011-12-04 SURGICAL SUPPLY — 66 items
1.3MM WIRE PASS DRILL ×2 IMPLANT
ALLOGRAFT CA 6X14X11 (Bone Implant) ×4 IMPLANT
BAG DECANTER FOR FLEXI CONT (MISCELLANEOUS) ×2 IMPLANT
BIT DRILL NEURO 2X3.1 SFT TUCH (MISCELLANEOUS) ×1 IMPLANT
BIT DRILL WIRE PASS 1.3MM (BIT) ×1 IMPLANT
BLADE ULTRA TIP 2M (BLADE) ×2 IMPLANT
BRUSH SCRUB EZ PLAIN DRY (MISCELLANEOUS) ×2 IMPLANT
BUR MATCHSTICK NEURO 3.0X3.8 (BURR) ×4 IMPLANT
CANISTER SUCTION 2500CC (MISCELLANEOUS) ×2 IMPLANT
CLAW UNIV SL SPETZLER MIC (INSTRUMENTS) IMPLANT
CLOTH BEACON ORANGE TIMEOUT ST (SAFETY) ×2 IMPLANT
CONT SPEC 4OZ CLIKSEAL STRL BL (MISCELLANEOUS) ×2 IMPLANT
COVER MAYO STAND STRL (DRAPES) ×2 IMPLANT
DECANTER SPIKE VIAL GLASS SM (MISCELLANEOUS) ×2 IMPLANT
DERMABOND ADHESIVE PROPEN (GAUZE/BANDAGES/DRESSINGS) ×1
DERMABOND ADVANCED (GAUZE/BANDAGES/DRESSINGS) ×1
DERMABOND ADVANCED .7 DNX12 (GAUZE/BANDAGES/DRESSINGS) ×1 IMPLANT
DERMABOND ADVANCED .7 DNX6 (GAUZE/BANDAGES/DRESSINGS) ×1 IMPLANT
DRAPE C-ARM 42X72 X-RAY (DRAPES) ×6 IMPLANT
DRAPE LAPAROTOMY 100X72 PEDS (DRAPES) ×2 IMPLANT
DRAPE MICROSCOPE LEICA (MISCELLANEOUS) ×2 IMPLANT
DRAPE POUCH INSTRU U-SHP 10X18 (DRAPES) ×2 IMPLANT
DRAPE PROXIMA HALF (DRAPES) IMPLANT
DRILL NEURO 2X3.1 SOFT TOUCH (MISCELLANEOUS) ×2
DRILL WIRE PASS 1.3MM (BIT) ×2
ELECT COATED BLADE 2.86 ST (ELECTRODE) IMPLANT
ELECT REM PT RETURN 9FT ADLT (ELECTROSURGICAL) ×2
ELECTRODE REM PT RTRN 9FT ADLT (ELECTROSURGICAL) ×1 IMPLANT
GAUZE SPONGE 4X4 16PLY XRAY LF (GAUZE/BANDAGES/DRESSINGS) ×2 IMPLANT
GLOVE BIOGEL PI IND STRL 7.5 (GLOVE) ×1 IMPLANT
GLOVE BIOGEL PI IND STRL 8 (GLOVE) ×1 IMPLANT
GLOVE BIOGEL PI INDICATOR 7.5 (GLOVE) ×1
GLOVE BIOGEL PI INDICATOR 8 (GLOVE) ×1
GLOVE ECLIPSE 7.5 STRL STRAW (GLOVE) ×12 IMPLANT
GLOVE EXAM NITRILE LRG STRL (GLOVE) IMPLANT
GLOVE EXAM NITRILE MD LF STRL (GLOVE) ×4 IMPLANT
GLOVE EXAM NITRILE XL STR (GLOVE) IMPLANT
GLOVE EXAM NITRILE XS STR PU (GLOVE) IMPLANT
GLOVE SS BIOGEL STRL SZ 7 (GLOVE) ×1 IMPLANT
GLOVE SUPERSENSE BIOGEL SZ 7 (GLOVE) ×1
GOWN BRE IMP SLV AUR LG STRL (GOWN DISPOSABLE) ×4 IMPLANT
GOWN BRE IMP SLV AUR XL STRL (GOWN DISPOSABLE) ×2 IMPLANT
GOWN STRL REIN 2XL LVL4 (GOWN DISPOSABLE) ×4 IMPLANT
HEAD HALTER (SOFTGOODS) ×2 IMPLANT
KIT BASIN OR (CUSTOM PROCEDURE TRAY) ×2 IMPLANT
KIT ROOM TURNOVER OR (KITS) ×2 IMPLANT
NEEDLE HYPO 25X1 1.5 SAFETY (NEEDLE) ×2 IMPLANT
NEEDLE SPNL 22GX3.5 QUINCKE BK (NEEDLE) ×2 IMPLANT
NS IRRIG 1000ML POUR BTL (IV SOLUTION) ×2 IMPLANT
PACK LAMINECTOMY NEURO (CUSTOM PROCEDURE TRAY) ×2 IMPLANT
PAD ARMBOARD 7.5X6 YLW CONV (MISCELLANEOUS) ×6 IMPLANT
RUBBERBAND STERILE (MISCELLANEOUS) ×4 IMPLANT
SCREW VAR 4.0X14MM (Screw) ×2 IMPLANT
SET TUBING W/EXT DISP (INSTRUMENTS) ×2 IMPLANT
SPONGE INTESTINAL PEANUT (DISPOSABLE) ×4 IMPLANT
SPONGE SURGIFOAM ABS GEL SZ50 (HEMOSTASIS) ×2 IMPLANT
STAPLER SKIN PROX WIDE 3.9 (STAPLE) ×2 IMPLANT
STRIP VITOSS 25X50X4MM (Neuro Prosthesis/Implant) ×2 IMPLANT
SUT VIC AB 2-0 CP2 18 (SUTURE) ×2 IMPLANT
SUT VIC AB 3-0 SH 8-18 (SUTURE) ×2 IMPLANT
SYR 20ML ECCENTRIC (SYRINGE) ×2 IMPLANT
TIP BSPETZLER BARACUDA 25KHZ (INSTRUMENTS) IMPLANT
TOWEL OR 17X24 6PK STRL BLUE (TOWEL DISPOSABLE) ×2 IMPLANT
TOWEL OR 17X26 10 PK STRL BLUE (TOWEL DISPOSABLE) ×2 IMPLANT
TUBE CONNECTING 12X1/4 (SUCTIONS) ×2 IMPLANT
WATER STERILE IRR 1000ML POUR (IV SOLUTION) ×2 IMPLANT

## 2011-12-04 NOTE — Progress Notes (Signed)
Filed Vitals:   12/04/11 1330 12/04/11 1345 12/04/11 1420 12/04/11 1425  BP:   134/81 147/95  Pulse:   81 75  Temp: 98 F (36.7 C) 98.1 F (36.7 C) 98.3 F (36.8 C) 98.4 F (36.9 C)  TempSrc:   Oral Oral  Resp:   18 18  Weight:      SpO2:    97%    Patient sitting up in chair, complaining of dysphagia, he has had moderate cough, and is bringing up some sputum. Wound is clean dry but there is mild swelling in the neck and ecchymosis below the incision. Moving all extremities well. Patient is voiding.  Plan: We'll continue IV fluids, will start Decadron 4 mg IV every 6 hours and Pepcid 20 mg IV every 24 hours. To ambulate as feasible this evening.  Hewitt Shorts, MD 12/04/2011, 6:16 PM

## 2011-12-04 NOTE — Op Note (Signed)
12/04/2011  1:20 PM  PATIENT:  Roger Phillips  56 y.o. 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  ANESTHESIA:   general  EBL:  Total I/O In: 3000 [I.V.:3000] Out: 650 [Urine:525; Blood:125]  BLOOD ADMINISTERED:none  COUNT: Correct per nursing staff  DICTATION: Patient was brought to the operating room placed under general endotracheal anesthesia. Patient was placed in 10 pounds of halter traction. The neck was prepped with Betadine soap and solution and draped in a sterile fashion. A horizontal incision was made on the left side of the neck. The line of the incision was infiltrated with local anesthetic with epinephrine. Dissection was carried down thru the subcutaneous tissue and platysma, bipolar cautery was used to maintain hemostasis. Dissection was then carried out thru an avascular plane leaving the sternocleidomastoid carotid artery and jugular vein laterally and the trachea and esophagus medially. The ventral aspect of the vertebral column was identified and a localizing was done with the C-arm fluoroscope. The patient had a sheet of bridging osteophytes across the ventral aspect of the vertebral column, but with the C-arm we were able to identify the C3-4 and C4-5 levels. To enter the disc space at each level, we had to use the high-speed drill and Kerrison punches to remove the anterior bridging osteophytes. Discectomy was performed with micro-curettes and pituitary rongeurs. The operating microscope was draped and  brought into the field provided additional magnification illumination and visualization. Discectomy was continued posteriorly thru the disc space and then the cartilaginous endplate was removed using micro-curettes along with the high-speed drill. Posterior osteophytic overgrowth was removed each level using the high-speed drill along with a 2 mm thin footplated Kerrison punch. Posterior longitudinal ligament along with disc herniation was carefully removed, decompressing the spinal canal and thecal sac. We then continued to remove osteophytic overgrowth and disc material decompressing the neural foramina and exiting nerve roots bilaterally. Once the decompression was completed hemostasis was established at each level with the use of Gelfoam with thrombin and bipolar cautery. The Gelfoam was removed the wound irrigated and hemostasis confirmed. We then measured the height of the intravertebral disc space level and selected a 6 millimeter in height structural allograft for the C3-4 level and a 6 millimeter in height structural allograft for the C4-5 level . Each was hydrated and saline solution and then gently positioned in the intravertebral disc space and countersunk. We then selected a 32 millimeter in height Tether cervical plate. It was positioned over the fusion construct and secured to the vertebra with a variety of screws. Each screw hole was started with the high-speed drill and then the screws placed, once all the screws were placed final tightening was performed. We used a 4 x 16 mm variable screw at C3 in the left, a 4.5 x 13 mm variable screw at C3 on the right, a 4 x 16 mm fixed screw at C4, and a pair of 4 x 16 mm variable screws at C5. The wound was irrigated with bacitracin solution checked for hemostasis which was established and confirmed. C-arm fluoroscopy was used throughout the procedure for localization to assist with our dissection and  to confirm good placement of the interbody implants (bone  graft) and plate and screws. All this was confirmed with the C-arm. We then proceeded with closure. The platysma was closed with interrupted inverted 2-0 undyed Vicryl suture, the subcutaneous and subcuticular closed with interrupted inverted 3-0 undyed Vicryl suture. The skin edges were approximated with Dermabond. Following surgery the patient was taken out of cervical traction. To be reversed and the anesthetic and taken to the recovery room for further care.   PLAN OF CARE: Admit for overnight observation  PATIENT DISPOSITION:  PACU - hemodynamically stable.   Delay start of Pharmacological VTE agent (>24hrs) due to surgical blood loss or risk of bleeding:  yes

## 2011-12-04 NOTE — Anesthesia Preprocedure Evaluation (Addendum)
Anesthesia Evaluation  Patient identified by MRN, date of birth, ID band Patient awake    Reviewed: Allergy & Precautions, H&P , NPO status , Patient's Chart, lab work & pertinent test results  Airway Mallampati: I TM Distance: >3 FB Neck ROM: Full    Dental  (+) Edentulous Upper and Edentulous Lower   Pulmonary shortness of breath and with exertion, asthma , pneumonia , COPD COPD inhaler,          Cardiovascular Exercise Tolerance: Good hypertension, Pt. on medications Regular     Neuro/Psych  Headaches, Anxiety Depression    GI/Hepatic GERD-  ,  Endo/Other  Diabetes mellitus-, Type 2, Oral Hypoglycemic Agents  Renal/GU      Musculoskeletal   Abdominal   Peds  Hematology   Anesthesia Other Findings   Reproductive/Obstetrics                          Anesthesia Physical Anesthesia Plan  ASA: II  Anesthesia Plan: General   Post-op Pain Management:    Induction: Intravenous  Airway Management Planned: Oral ETT  Additional Equipment:   Intra-op Plan:   Post-operative Plan: Extubation in OR  Informed Consent: I have reviewed the patients History and Physical, chart, labs and discussed the procedure including the risks, benefits and alternatives for the proposed anesthesia with the patient or authorized representative who has indicated his/her understanding and acceptance.     Plan Discussed with: CRNA and Surgeon  Anesthesia Plan Comments:         Anesthesia Quick Evaluation

## 2011-12-04 NOTE — Transfer of Care (Signed)
Immediate Anesthesia Transfer of Care Note  Patient: Roger Phillips  Procedure(s) Performed: Procedure(s) (LRB): ANTERIOR CERVICAL DECOMPRESSION/DISCECTOMY FUSION 2 LEVELS (N/A)  Patient Location: PACU  Anesthesia Type: General  Level of Consciousness: awake, alert , oriented and sedated  Airway & Oxygen Therapy: Patient Spontanous Breathing and Patient connected to nasal cannula oxygen  Post-op Assessment: Report given to PACU RN, Post -op Vital signs reviewed and stable and Patient moving all extremities  Post vital signs: Reviewed and stable  Complications: No apparent anesthesia complications

## 2011-12-04 NOTE — H&P (Signed)
Subjective: Patient is a 56 y.o. male who is admitted for treatment of cervical myelopathy. Patient developed weakness in his left hand over the past year and for the past 8 months has had shaking in the left hand. He does describe some discomfort in the bases neck pain into the left arm but no numbness or paresthesias.  Examination shows weakness in the distal left upper extremity and proximal left lower extremity as well as asymmetric hyperreflexia worse and left than the right.  X-rays and MRI scan show advanced severe changes at C3-4 and C4-5. At C3-4 there is a large calcified spur central to left with significant spinal cord compression and increased signal within the spinal cord. At C4-5 is a large central spondylitic disc herniation with spinal cord compression and increased signal within the spinal cord.  Patient is admitted now for 2 level CIII-4 and C4-5 ACDF.   Patient Active Problem List  Diagnoses Date Noted  . COPD (chronic obstructive pulmonary disease) 07/04/2011  . Pulmonary nodule 05/09/2011  . Hemoptysis 05/09/2011  . Tobacco abuse 05/09/2011   Past Medical History  Diagnosis Date  . High cholesterol     takes Pravastatin daily  . COPD (chronic obstructive pulmonary disease)   . HTN (hypertension)     takes Lisinopril,Norvasc,and HCTZ daily  . Asthma   . Emphysema   . Shortness of breath     with exertion  . Pneumonia     hx of in 80's and 90's  . Sinus headache     sinus drainage  . Arthritis     neck and left shoulder  . Back pain     buldging disc  . Bruises easily   . GERD (gastroesophageal reflux disease)     takes Omeprazole daily  . Hemorrhoids   . IBS (irritable bowel syndrome)   . Diabetes mellitus     takes Metformin bid  . Depression     takes Wellbutrin  . Insomnia     doesn't take any meds     Past Surgical History  Procedure Date  . Carpal tunnel release 10+yrs    right side  . Cystectomy 12-35yrs ago    from left side of neck    Prescriptions prior to admission  Medication Sig Dispense Refill  . albuterol (PROVENTIL) (2.5 MG/3ML) 0.083% nebulizer solution Take 3 mLs (2.5 mg total) by nebulization every 4 (four) hours as needed.  75 mL  0  . albuterol (VENTOLIN HFA) 108 (90 BASE) MCG/ACT inhaler Inhale 2 puffs into the lungs every 4 (four) hours as needed. For shortness of breath      . amLODipine (NORVASC) 5 MG tablet Take 5 mg by mouth daily.        Marland Kitchen buPROPion (WELLBUTRIN SR) 150 MG 12 hr tablet Take 150 mg by mouth 2 (two) times daily.      . chlorpheniramine (CHLOR-TRIMETON) 4 MG tablet Take 4 mg by mouth as needed.      . diphenoxylate-atropine (LOMOTIL) 2.5-0.025 MG per tablet Take 1 tablet by mouth daily.       . Fluticasone-Salmeterol (ADVAIR DISKUS) 250-50 MCG/DOSE AEPB Inhale 1 puff into the lungs every 12 (twelve) hours.        . hydrochlorothiazide 25 MG tablet Take 25 mg by mouth daily.        Marland Kitchen HYDROcodone-homatropine (HYDROMET) 5-1.5 MG/5ML syrup Take 5 mLs by mouth every 4 (four) hours as needed. For cough      . lisinopril (PRINIVIL,ZESTRIL) 5  MG tablet Take 5 mg by mouth daily.      . metFORMIN (GLUCOPHAGE) 500 MG tablet Take 500 mg by mouth 2 (two) times daily with a meal.      . mometasone (NASONEX) 50 MCG/ACT nasal spray Place 2 sprays into the nose daily.      Marland Kitchen omeprazole (PRILOSEC) 20 MG capsule Take 20 mg by mouth daily.      . pravastatin (PRAVACHOL) 20 MG tablet Take 20 mg by mouth daily.        . sildenafil (VIAGRA) 100 MG tablet Take 100 mg by mouth daily as needed. For erectile dysfunction      . tiotropium (SPIRIVA) 18 MCG inhalation capsule Place 18 mcg into inhaler and inhale daily as needed. For shortness of breath      . ipratropium-albuterol (DUONEB) 0.5-2.5 (3) MG/3ML SOLN Take 3 mLs by nebulization every 6 (six) hours.  360 mL  0   Allergies  Allergen Reactions  . Sulfa Antibiotics Rash    History  Substance Use Topics  . Smoking status: Current Everyday Smoker -- 1.0  packs/day for 40 years    Types: Cigarettes  . Smokeless tobacco: Not on file  . Alcohol Use: 0.0 oz/week    Family History  Problem Relation Age of Onset  . Heart disease Father   . Heart disease Brother   . Anesthesia problems Neg Hx   . Hypotension Neg Hx   . Malignant hyperthermia Neg Hx   . Pseudochol deficiency Neg Hx      Review of Systems A comprehensive review of systems was negative.  Objective: Vital signs in last 24 hours: Temp:  [97.1 F (36.2 C)-98 F (36.7 C)] 98 F (36.7 C) (02/28 0620) Pulse Rate:  [65-71] 71  (02/28 0620) Resp:  [18-20] 18  (02/28 0620) BP: (121-147)/(76-87) 121/76 mmHg (02/28 0620) SpO2:  [96 %-99 %] 99 % (02/28 0620) Weight:  [108.5 kg (239 lb 3.2 oz)] 108.5 kg (239 lb 3.2 oz) (02/28 0700)  EXAM: Patient is a well-developed well-nourished white male in no acute distress. Lungs are clear to auscultation , the patient has symmetrical respiratory excursion. Heart has a regular rate and rhythm normal S1 and S2 no murmur.   Abdomen is soft nontender nondistended bowel sounds are present. Extremity examination shows no clubbing cyanosis or edema. Musculoskeletal examination shows no tenderness to palpation over the cervical spinous processes or paracervical musculature. He has a good range of motion neck flexion, extension, and lateral flexion to either side. Motor exam shows 5 over 5 strength in the deltoid biceps and triceps bilaterally. The right grip and intrinsics are 5, however the left grip and intrinsics are 4 minus over 5 and there is notable atrophy in the intrinsic musculature of the left hand. In the lower extremities left iliopsoas is 4+ to 5 and the right iliopsoas is 5. Quadriceps are 5 by sensation is intact to pinprick in the digits the upper extremity. Reflexes left biceps brachii also 2-3, right biceps and brachialis are 1. Left triceps is 3-4, right triceps is to. Left quadriceps is 4+ with clonus right quadriceps is 1-2. Left  yesterday was 4 with sustained clonus and the right gastrocnemius is 3-4 with 23 beats of clonus. Left toe is upgoing and the right toe is downgoing. He has a normal gait and stance.  Data Review:CBC    Component Value Date/Time   WBC 7.7 12/03/2011 0918   RBC 5.38 12/03/2011 0918   HGB 16.7 12/03/2011  0918   HCT 46.6 12/03/2011 0918   PLT 247 12/03/2011 0918   MCV 86.6 12/03/2011 0918   MCH 31.0 12/03/2011 0918   MCHC 35.8 12/03/2011 0918   RDW 13.3 12/03/2011 0918                          BMET    Component Value Date/Time   NA 136 12/03/2011 0918   K 3.8 12/03/2011 0918   CL 98 12/03/2011 0918   CO2 28 12/03/2011 0918   GLUCOSE 133* 12/03/2011 0918   BUN 18 12/03/2011 0918   CREATININE 1.00 12/03/2011 0918   CALCIUM 9.7 12/03/2011 0918   GFRNONAA 82* 12/03/2011 0918   GFRAA >90 12/03/2011 1610     Assessment/Plan: Patient with cervical myelopathy worse on the left than the right with weakness of the left intrinsics and grip and iliopsoas. He has significant spinal cord compression at both the C3-4 and C4-5 levels with increased signal within the spinal cord at each level. He is admitted now for 2 level C3-4 and C4-5 ACDF. I've discussed with the patient the nature of his condition, the nature the surgical procedure, the typical length of surgery, hospital stay, and overall recuperation. We discussed limitations postoperatively. I discussed risks of surgery including risks of infection, bleeding, possibly need for transfusion, the risk of nerve root dysfunction with pain, weakness, numbness, or paresthesias, the risk of spinal cord dysfunction with paralysis of all 4 limbs and quadriplegia, and the risk of dural tear and CSF leakage and possible need for further surgery, the risk of esophageal dysfunction causing dysphagia and the risk of laryngeal dysfunction causing hoarseness of the voice, the risk of failure of the arthrodesis and the possible need for further surgery, and the risk of anesthetic  complications including myocardial infarction, stroke, pneumonia, and death. We discussed the nature of the patient's neurologic condition and the uncertainty of neurologic recovery with decompression of the hope to reduce the progression of neurologic deficit or dysfunction. We also discussed the need for postoperative immobilization in a cervical collar. Understanding all this the patient does wish to proceed with surgery and is admitted for such.    Hewitt Shorts, MD 12/04/2011 7:34 AM

## 2011-12-04 NOTE — Preoperative (Signed)
Beta Blockers   Reason not to administer Beta Blockers:Not Applicable 

## 2011-12-04 NOTE — Anesthesia Postprocedure Evaluation (Signed)
Anesthesia Post Note  Patient: Roger Phillips  Procedure(s) Performed: Procedure(s) (LRB): ANTERIOR CERVICAL DECOMPRESSION/DISCECTOMY FUSION 2 LEVELS (N/A)  Anesthesia type: general  Patient location: PACU  Post pain: Pain level controlled  Post assessment: Patient's Cardiovascular Status Stable  Last Vitals:  Filed Vitals:   12/04/11 1330  BP:   Pulse:   Temp: 36.7 C  Resp:     Post vital signs: Reviewed and stable  Level of consciousness: sedated  Complications: No apparent anesthesia complications

## 2011-12-05 LAB — GLUCOSE, CAPILLARY
Glucose-Capillary: 186 mg/dL — ABNORMAL HIGH (ref 70–99)
Glucose-Capillary: 173 mg/dL — ABNORMAL HIGH (ref 70–99)
Glucose-Capillary: 174 mg/dL — ABNORMAL HIGH (ref 70–99)
Glucose-Capillary: 222 mg/dL — ABNORMAL HIGH (ref 70–99)

## 2011-12-05 NOTE — Progress Notes (Signed)
Filed Vitals:   12/04/11 1425 12/04/11 2000 12/04/11 2333 12/05/11 0400  BP: 147/95 135/78 130/86 120/67  Pulse: 75 72 70 64  Temp: 98.4 F (36.9 C) 97.3 F (36.3 C) 98 F (36.7 C) 97.9 F (36.6 C)  TempSrc: Oral     Resp: 18 18 18 18   Weight:      SpO2: 97% 97% 96% 96%   Patient sitting up in chair, spitting into kidney basin because of dysphagia. Patient notes that he has mild difficulty with ice chips, but just given some note by the nurse and he actually drank that more easily. However his by mouth intake has been very limited and he continues an IVF. We see moderate swelling beneath his incision, and he continues on Decadron IV and Pepcid IV.  Patient is asking to be discharged, however his by mouth intake is insufficient and I emphasized to him the safety of continue to monitor his swallowing, breathing, and swelling in his neck.  Plan: Continue IVF, continue Decadron IV, continue Pepcid IV. Encouraged to ambulate.  Hewitt Shorts, MD 12/05/2011, 8:12 AM

## 2011-12-05 NOTE — Discharge Summary (Signed)
Physician Discharge Summary  Patient ID: Roger Phillips MRN: [id number] DOB/AGE: 1956/06/17 56 y.o.  Admit date: 12/04/2011 Discharge date: 12/05/2011  Admission Diagnoses: Cervical HNP with myelopathy, cervical spondylosis with myelopathy, cervical degenerative disc disease, quadriparesis.  Discharge Diagnoses:Cervical HNP with myelopathy, cervical spondylosis with myelopathy, cervical degenerative disc disease, quadriparesis.   Discharged Condition: fair  Hospital Course: Patient was admitted and underwent a 2 level C3-4 and C4-5 anterior cervical decompression and arthrodesis with allograft and tether cervical plating. Postoperatively he has done well from a neurologic perspective. He notes that following surgery the pain that he had in his left arm is gone and he feels the strength is better in his hands particularly the left hand. However he's had a moderate hematoma form in the soft tissues of his neck, there is ecchymosis below the level of the incision extending down into the upper anterior chest. He denies dyspnea, but does have moderate dysphagia. However he has been able to drink milk and has had several cups of ice cream. On the night of surgery he was started on Decadron IV and Pepcid IV. I have recommended the patient that he remain in the hospital for continued observation and care, however he has insisted on being discharged to home. I. therefore have given him prescriptions for Decadron, Pepcid, and OxyIR. Specifically Decadron 4 mg tablets, he is to take 1 tablet twice a day for 2 days, then half a tablet twice a day for 2 days, then half a tablet daily for 2 days, then half a tablet q. oh day for 2 doses, then discontinue, of procedures for 8 tablets no refills was given. Also prescription for Pepcid 20 mg twice a day 30 tablets no refills, and OxyIR 5 mg tablets, one or 2 tablets by mouth 4 times a day when necessary for pain 40 tablets prescribed no refills. Patient has been given  instructions regarding wound care and activities following discharge I've asked him to return for followup with me in the middle of next week. He is also along with his wife given instructions that he if he should develop difficulties with dyspnea or increased difficulties with dysphagia patient return to the emergency room for evaluation, on an emergent basis to [facility] or on an urgent basis to [facility].  Discharge Exam: Blood pressure 154/82, pulse 73, temperature 97.5 F (36.4 C), temperature source Oral, resp. rate 20, weight 108.5 kg (239 lb 3.2 oz), SpO2 98.00%.   Disposition: Home  Discharge Orders    Future Appointments: Provider: Department: Dept Phone: Center:   01/01/2012 8:30 AM [facility] [phone] [facility]   01/12/2012 2:00 PM Comer Locket. Vassie Loll, MD [facility] [phone] None     Medication List  As of 12/05/2011  6:31 PM   TAKE these medications         ADVAIR DISKUS 250-50 MCG/DOSE Aepb   Generic drug: Fluticasone-Salmeterol   Inhale 1 puff into the lungs every 12 (twelve) hours.      amLODipine 5 MG tablet   Commonly known as: NORVASC   Take 5 mg by mouth daily.      buPROPion 150 MG 12 hr tablet   Commonly known as: WELLBUTRIN SR   Take 150 mg by mouth 2 (two) times daily.      chlorpheniramine 4 MG tablet   Commonly known as: CHLOR-TRIMETON   Take 4 mg by mouth as needed.      diphenoxylate-atropine 2.5-0.025 MG per tablet   Commonly  known as: LOMOTIL   Take 1 tablet by mouth daily.      hydrochlorothiazide 25 MG tablet   Commonly known as: HYDRODIURIL   Take 25 mg by mouth daily.      HYDROMET 5-1.5 MG/5ML syrup   Generic drug: HYDROcodone-homatropine   Take 5 mLs by mouth every 4 (four) hours as needed. For cough      ipratropium-albuterol 0.5-2.5 (3) MG/3ML Soln   Commonly known as: DUONEB   Take 3 mLs by nebulization every 6 (six) hours.      lisinopril 5 MG tablet   Commonly known as: PRINIVIL,ZESTRIL   Take 5 mg by  mouth daily.      metFORMIN 500 MG tablet   Commonly known as: GLUCOPHAGE   Take 500 mg by mouth 2 (two) times daily with a meal.      mometasone 50 MCG/ACT nasal spray   Commonly known as: NASONEX   Place 2 sprays into the nose daily.      omeprazole 20 MG capsule   Commonly known as: PRILOSEC   Take 20 mg by mouth daily.      pravastatin 20 MG tablet   Commonly known as: PRAVACHOL   Take 20 mg by mouth daily.      sildenafil 100 MG tablet   Commonly known as: VIAGRA   Take 100 mg by mouth daily as needed. For erectile dysfunction      tiotropium 18 MCG inhalation capsule   Commonly known as: SPIRIVA   Place 18 mcg into inhaler and inhale daily as needed. For shortness of breath      VENTOLIN HFA 108 (90 BASE) MCG/ACT inhaler   Generic drug: albuterol   Inhale 2 puffs into the lungs every 4 (four) hours as needed. For shortness of breath      albuterol (2.5 MG/3ML) 0.083% nebulizer solution   Commonly known as: PROVENTIL   Take 3 mLs (2.5 mg total) by nebulization every 4 (four) hours as needed.             Signed: Hewitt Shorts, MD 12/05/2011, 6:31 PM

## 2011-12-05 NOTE — Discharge Instructions (Signed)

## 2011-12-10 ENCOUNTER — Encounter (HOSPITAL_COMMUNITY): Payer: Self-pay | Admitting: Neurosurgery

## 2012-01-01 ENCOUNTER — Ambulatory Visit (INDEPENDENT_AMBULATORY_CARE_PROVIDER_SITE_OTHER)
Admission: RE | Admit: 2012-01-01 | Discharge: 2012-01-01 | Disposition: A | Discharge status: Home / Self Care | Payer: PRIVATE HEALTH INSURANCE | Source: Ambulatory Visit | Attending: Pulmonary Disease | Admitting: Pulmonary Disease

## 2012-01-01 DIAGNOSIS — R911 Solitary pulmonary nodule: Secondary | ICD-10-CM

## 2012-01-07 ENCOUNTER — Telehealth: Payer: Self-pay | Admitting: Pulmonary Disease

## 2012-01-07 NOTE — Telephone Encounter (Signed)
Dup message °

## 2012-01-07 NOTE — Telephone Encounter (Signed)
LM for Rose at [facility] to call back.  I see encounter listed on 01-01-12 for Ct but no results are in EMR.

## 2012-01-07 NOTE — Telephone Encounter (Signed)
Spoke with pt's wife and given results of Ct per Dr Vassie Loll.  Pt has appt to discuss Ct on 01-12-12 with Dr Vassie Loll.

## 2012-01-07 NOTE — Telephone Encounter (Signed)
Lm for pt tcb. 

## 2012-01-07 NOTE — Telephone Encounter (Signed)
Patient requesting Ct results.

## 2012-01-12 ENCOUNTER — Ambulatory Visit (INDEPENDENT_AMBULATORY_CARE_PROVIDER_SITE_OTHER): Payer: PRIVATE HEALTH INSURANCE | Admitting: Pulmonary Disease

## 2012-01-12 ENCOUNTER — Encounter: Payer: Self-pay | Admitting: Pulmonary Disease

## 2012-01-12 VITALS — BP 124/76 | HR 83 | Temp 99.0°F | Ht 72.0 in | Wt 238.4 lb

## 2012-01-12 DIAGNOSIS — R911 Solitary pulmonary nodule: Secondary | ICD-10-CM

## 2012-01-12 DIAGNOSIS — J449 Chronic obstructive pulmonary disease, unspecified: Secondary | ICD-10-CM

## 2012-01-12 NOTE — Patient Instructions (Signed)
OK to take ZYRTEC-D for allergies - this may raise your BP by a few points If coughing persists, may have to stop or change  lisinopril to other medication Discuss calcium in coronary arteries with Dr Gerda Diss You have to stop smoking Repeat CT in 1 year

## 2012-01-12 NOTE — Progress Notes (Signed)
  Subjective:    Patient ID: Roger Phillips, male    DOB: [birth year], 56 y.o.   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  Accompanied by wife, [name] who is CNA at [employer].  He reported blood tinged sputum on occasion over the past few months, last such episode 2 wks ago. His brother had throat cancer, he denies change in voice, hoarseness or sensation of a lump.  He has been on adviar & ventolin, spiriva was recetly added, completed course of cipro & prednisone.  With chantix, he smokes 1 PPd, down from 1.5 ppD  He binge drinks on the weekends & hemoptysis is worse after this.  Spirometry showed FEv1 53% (2.1) & FVC of 55%, ratio 75.  CXR /30/12 was wnl  CBC showed polycythemia with Hb 17  Bscopy - no endobronchial lesions  Pulm nodules & 1.2 cm subcarinal LN were first noted on a CT scan in 6/11. ON a FU CT in oct'11, the LN had decresaed in size & the LUL nodule had resolved, others were stable.  CT 8/12 >>Previously seen right upper lobe pulmonary nodule is slightly less conspicuous, 4 mm current versus 5 mm previous. Additional tiny questionable nodular foci in both lungs are also  unchanged, with two additional tiny questionable nodules at right upper right lower lobes not definitely seen on previous study      01/12/2012 2 mth FU Underwent  cervical spine decompression surgery  - Dr Newell Coral  He reports sinus drainage Continues to smoke 1/2 PPD, has cut down on alcohol - no h/s/o withdrawal  No nocturnal wheeze, chest pain, orthopnea or PND  Pt states his breathing has been doing good. C/o cough w/ clear phlem, PND, very little wheezing Ct chest - unchanged nodules, coronary calcifications  Review of Systems Patient denies significant dyspnea,cough, hemoptysis,  chest pain, palpitations, pedal edema, orthopnea, paroxysmal nocturnal dyspnea, lightheadedness, nausea, vomiting, abdominal or  leg pains     Objective:   Physical Exam  Gen. Pleasant, well-nourished, in no distress ENT - no lesions, no post nasal drip Neck: No JVD, no thyromegaly, no carotid bruits Lungs: no use of accessory muscles, no dullness to percussion, clear without rales or rhonchi  Cardiovascular: Rhythm regular, heart sounds  normal, no murmurs or gallops, no peripheral edema Musculoskeletal: No deformities, no cyanosis or clubbing        Assessment & Plan:

## 2012-01-13 NOTE — Assessment & Plan Note (Signed)
Repeat CT in 1 year, likely benign nodules

## 2012-01-13 NOTE — Assessment & Plan Note (Signed)
OK to take ZYRTEC-D for allergies - this may raise your BP by a few points If coughing persists, may have to stop or change  lisinopril to other medication Discuss calcium in coronary arteries with Dr Gerda Diss You have to stop smoking

## 2012-04-12 ENCOUNTER — Ambulatory Visit (INDEPENDENT_AMBULATORY_CARE_PROVIDER_SITE_OTHER): Payer: PRIVATE HEALTH INSURANCE | Admitting: Cardiology

## 2012-04-12 ENCOUNTER — Encounter: Payer: Self-pay | Admitting: Cardiology

## 2012-04-12 ENCOUNTER — Encounter (INDEPENDENT_AMBULATORY_CARE_PROVIDER_SITE_OTHER): Payer: PRIVATE HEALTH INSURANCE

## 2012-04-12 VITALS — BP 130/86 | HR 72 | Wt 232.0 lb

## 2012-04-12 DIAGNOSIS — E785 Hyperlipidemia, unspecified: Secondary | ICD-10-CM | POA: Insufficient documentation

## 2012-04-12 DIAGNOSIS — M7989 Other specified soft tissue disorders: Secondary | ICD-10-CM

## 2012-04-12 DIAGNOSIS — F17200 Nicotine dependence, unspecified, uncomplicated: Secondary | ICD-10-CM

## 2012-04-12 DIAGNOSIS — I10 Essential (primary) hypertension: Secondary | ICD-10-CM

## 2012-04-12 DIAGNOSIS — I2510 Atherosclerotic heart disease of native coronary artery without angina pectoris: Secondary | ICD-10-CM

## 2012-04-12 DIAGNOSIS — M79609 Pain in unspecified limb: Secondary | ICD-10-CM

## 2012-04-12 DIAGNOSIS — R609 Edema, unspecified: Secondary | ICD-10-CM

## 2012-04-12 DIAGNOSIS — Z720 Tobacco use: Secondary | ICD-10-CM

## 2012-04-12 NOTE — Assessment & Plan Note (Signed)
Continue statin. 

## 2012-04-12 NOTE — Assessment & Plan Note (Signed)
Blood pressure controlled. Continue present medications. 

## 2012-04-12 NOTE — Patient Instructions (Addendum)
Your physician recommends that you schedule a follow-up appointment in: AS NEEDED PENDING TEST RESULTS  Your physician has requested that you have a lexiscan myoview. For further information please visit [email or web address] Please follow instruction sheet, as given.   Your physician has requested that you have a lower LEFT extremity venous duplex. This test is an ultrasound of the veins in the legs or arms. It looks at venous blood flow that carries blood from the heart to the legs or arms. Allow one hour for a Lower Venous exam. Allow thirty minutes for an Upper Venous exam. There are no restrictions or special instructions. R/O DVT

## 2012-04-12 NOTE — Assessment & Plan Note (Signed)
Patient counseled on discontinuing. 

## 2012-04-12 NOTE — Progress Notes (Signed)
HPI: 56 year old male with no prior cardiac history for evaluation of coronary artery disease noted on CT scan. Chest CT in September of 2012 showed pulmonary nodules which are being followed by pulmonary. There was emphysema. There was also atherosclerosis of the thoracic aorta, great vessels and coronary arteries including the LAD. Cardiology is therefore asked to evaluate. The patient has dyspnea with moderate activities but not routine activities. No orthopnea, PND, syncope or chest pain. He has had occasional syncopal episodes in the remote past with coughing. In the last 2 weeks he has noticed some edema in his left lower extremity that began in his knee.  Current Outpatient Prescriptions  Medication Sig Dispense Refill  . albuterol (PROVENTIL) (2.5 MG/3ML) 0.083% nebulizer solution Take 3 mLs (2.5 mg total) by nebulization every 4 (four) hours as needed.  75 mL  0  . albuterol (VENTOLIN HFA) 108 (90 BASE) MCG/ACT inhaler Inhale 2 puffs into the lungs every 4 (four) hours as needed. For shortness of breath      . amLODipine (NORVASC) 5 MG tablet Take 5 mg by mouth daily.        . cetirizine (ZYRTEC) 10 MG tablet Take 10 mg by mouth daily.      . diphenoxylate-atropine (LOMOTIL) 2.5-0.025 MG per tablet Take 1 tablet by mouth 4 (four) times daily as needed.      . Fluticasone-Salmeterol (ADVAIR DISKUS) 250-50 MCG/DOSE AEPB Inhale 1 puff into the lungs every 12 (twelve) hours.        Marland Kitchen glipiZIDE (GLUCOTROL) 5 MG tablet Take 2.5 mg by mouth daily.      . hydrochlorothiazide 25 MG tablet Take 25 mg by mouth daily.        Marland Kitchen ipratropium-albuterol (DUONEB) 0.5-2.5 (3) MG/3ML SOLN Take 3 mLs by nebulization every 6 (six) hours.  360 mL  0  . mometasone (NASONEX) 50 MCG/ACT nasal spray Place 2 sprays into the nose daily.      Marland Kitchen omeprazole (PRILOSEC) 20 MG capsule Take 20 mg by mouth daily.      . pravastatin (PRAVACHOL) 20 MG tablet Take 20 mg by mouth daily.        . sildenafil (VIAGRA) 100 MG tablet  Take 100 mg by mouth daily as needed. For erectile dysfunction        Allergies  Allergen Reactions  . Sulfa Antibiotics Rash    Past Medical History  Diagnosis Date  . High cholesterol     takes Pravastatin daily  . COPD (chronic obstructive pulmonary disease)   . HTN (hypertension)     takes Lisinopril,Norvasc,and HCTZ daily  . Asthma   . Emphysema   . Pneumonia     hx of in 80's and 90's  . Sinus headache     sinus drainage  . Arthritis     neck and left shoulder  . Back pain     buldging disc  . GERD (gastroesophageal reflux disease)     takes Omeprazole daily  . Hemorrhoids   . IBS (irritable bowel syndrome)   . Diabetes mellitus     takes Metformin bid  . Depression     takes Wellbutrin  . Insomnia     doesn't take any meds     Past Surgical History  Procedure Date  . Carpal tunnel release 10+yrs    right side  . Cystectomy 12-87yrs ago    from left side of neck  . Anterior cervical decomp/discectomy fusion 12/04/2011    Procedure: ANTERIOR CERVICAL DECOMPRESSION/DISCECTOMY  FUSION 2 LEVELS;  Surgeon: Hewitt Shorts, MD;  Location: [facility];  Service: Neurosurgery;  Laterality: N/A;  Cervical Three-Four, Cervical Four-Five anterior cervial decompression with fusion plating and bonegraft    History   Social History  . Marital Status: Married    Spouse Name: N/A    Number of Children: 2  . Years of Education: N/A   Occupational History  . retired    Social History Main Topics  . Smoking status: Current Everyday Smoker -- 1.0 packs/day for 40 years    Types: Cigarettes  . Smokeless tobacco: Not on file  . Alcohol Use: 0.0 oz/week  . Drug Use: No  . Sexually Active: Yes   Other Topics Concern  . Not on file   Social History Narrative  . No narrative on file    Family History  Problem Relation Age of Onset  . Heart disease Father   . Heart disease Brother   . Anesthesia problems Neg Hx   . Hypotension Neg Hx   . Malignant  hyperthermia Neg Hx   . Pseudochol deficiency Neg Hx     ROS: left knee arthralgias butno fevers or chills, productive cough, hemoptysis, dysphasia, odynophagia, melena, hematochezia, dysuria, hematuria, rash, seizure activity, orthopnea, PND,  claudication. Remaining systems are negative.  Physical Exam:   Blood pressure 130/86, pulse 72, weight 105.235 kg (232 lb).  General:  Well developed/well nourished in NAD Skin warm/dry Patient not depressed No peripheral clubbing Back-normal HEENT-normal/normal eyelids Neck supple/normal carotid upstroke bilaterally; no bruits; no JVD; no thyromegaly chest - CTA/ normal expansion CV - RRR/normal S1 and S2; no murmurs, rubs or gallops;  PMI nondisplaced Abdomen -NT/ND, no HSM, no mass, + bowel sounds, no bruit 2+ femoral pulses, no bruits Ext-no chords, 2+ DP, 1+ edema in the calf of the left lower extremity. Neuro-grossly nonfocal  ECG sinus rhythm at a rate of 72. No ST changes noted.

## 2012-04-12 NOTE — Assessment & Plan Note (Signed)
Patient with isolated left lower extremity edema. Schedule ultrasound to exclude DVT.

## 2012-04-12 NOTE — Assessment & Plan Note (Signed)
Patient is noted to have calcium in his coronary arteries on CT scan. His electrocardiogram is normal. He does have dyspnea but also has significant pulmonary disease. I will plan a Myoview for risk stratification. I will add enteric-coated aspirin 81 mg daily. Continue statin.

## 2012-04-13 ENCOUNTER — Ambulatory Visit (HOSPITAL_COMMUNITY): Payer: PRIVATE HEALTH INSURANCE | Attending: Cardiology | Admitting: Radiology

## 2012-04-13 VITALS — BP 115/78 | Ht 72.0 in | Wt 233.0 lb

## 2012-04-13 DIAGNOSIS — R55 Syncope and collapse: Secondary | ICD-10-CM | POA: Insufficient documentation

## 2012-04-13 DIAGNOSIS — Z8249 Family history of ischemic heart disease and other diseases of the circulatory system: Secondary | ICD-10-CM | POA: Insufficient documentation

## 2012-04-13 DIAGNOSIS — F17200 Nicotine dependence, unspecified, uncomplicated: Secondary | ICD-10-CM | POA: Insufficient documentation

## 2012-04-13 DIAGNOSIS — J449 Chronic obstructive pulmonary disease, unspecified: Secondary | ICD-10-CM | POA: Insufficient documentation

## 2012-04-13 DIAGNOSIS — I10 Essential (primary) hypertension: Secondary | ICD-10-CM | POA: Insufficient documentation

## 2012-04-13 DIAGNOSIS — R0609 Other forms of dyspnea: Secondary | ICD-10-CM | POA: Insufficient documentation

## 2012-04-13 DIAGNOSIS — R0602 Shortness of breath: Secondary | ICD-10-CM

## 2012-04-13 DIAGNOSIS — R079 Chest pain, unspecified: Secondary | ICD-10-CM | POA: Insufficient documentation

## 2012-04-13 DIAGNOSIS — J438 Other emphysema: Secondary | ICD-10-CM | POA: Insufficient documentation

## 2012-04-13 DIAGNOSIS — E119 Type 2 diabetes mellitus without complications: Secondary | ICD-10-CM | POA: Insufficient documentation

## 2012-04-13 DIAGNOSIS — R0989 Other specified symptoms and signs involving the circulatory and respiratory systems: Secondary | ICD-10-CM | POA: Insufficient documentation

## 2012-04-13 DIAGNOSIS — E785 Hyperlipidemia, unspecified: Secondary | ICD-10-CM | POA: Insufficient documentation

## 2012-04-13 DIAGNOSIS — J4489 Other specified chronic obstructive pulmonary disease: Secondary | ICD-10-CM | POA: Insufficient documentation

## 2012-04-13 DIAGNOSIS — I2510 Atherosclerotic heart disease of native coronary artery without angina pectoris: Secondary | ICD-10-CM

## 2012-04-13 MED ORDER — TECHNETIUM TC 99M TETROFOSMIN IV KIT
11.0000 | PACK | Freq: Once | INTRAVENOUS | Status: AC | PRN
  Administered 2012-04-13: 11 via INTRAVENOUS

## 2012-04-13 MED ORDER — TECHNETIUM TC 99M TETROFOSMIN IV KIT
33.0000 | PACK | Freq: Once | INTRAVENOUS | Status: AC | PRN
  Administered 2012-04-13: 33 via INTRAVENOUS

## 2012-04-13 MED ORDER — REGADENOSON 0.4 MG/5ML IV SOLN
0.4000 mg | Freq: Once | INTRAVENOUS | Status: AC
  Administered 2012-04-13: 0.4 mg via INTRAVENOUS

## 2012-04-13 NOTE — Progress Notes (Signed)
[facility] SITE 3 NUCLEAR MED [address] [place] [address] [phone]  Cardiology Nuclear Med Study  Roger Phillips is a 56 y.o. male     MRN : [id number]     DOB: 1956/06/08  Procedure Date: 04/13/2012  Nuclear Med Background Indication for Stress Test:  Evaluation for Ischemia, and 01-01-12 CT Chest: atherosclerosis Coronary arteries including LAD History:  Asthma, COPD and Emphysema Cardiac Risk Factors: Family History - CAD, Hypertension, Lipids, NIDDM and Smoker  Symptoms:  Chest Pain, DOE and Syncope   Nuclear Pre-Procedure Caffeine/Decaff Intake:  None > 12 hrs NPO After: 8:30pm   Lungs:  clear O2 Sat: 97% on room air. IV 0.9% NS with Angio Cath:  22g  IV Site: R Forearm x 1, tolerated well IV Started by:  Irean Hong, RN  Chest Size (in):  46 Cup Size: n/a  Height: 6' (1.829 m)  Weight:  233 lb (105.688 kg)  BMI:  Body mass index is 31.60 kg/(m^2). Tech Comments:  Held glipizide this am    Nuclear Med Study 1 or 2 day study: 1 day  Stress Test Type:  Lexiscan  Reading MD: Marca Ancona, MD  Order Authorizing Provider:  Olga Millers, MD  Resting Radionuclide: Technetium 75m Tetrofosmin  Resting Radionuclide Dose: 11.0 mCi   Stress Radionuclide:  Technetium 80m Tetrofosmin  Stress Radionuclide Dose: 33.0 mCi           Stress Protocol Rest HR: 61 Stress HR: 82  Rest BP: 115/78 Stress BP: 137/95  Exercise Time (min): n/a METS: n/a   Predicted Max HR: 163 bpm % Max HR: 50.31 bpm Rate Pressure Product: 28413   Dose of Adenosine (mg):  n/a Dose of Lexiscan: 0.4 mg  Dose of Atropine (mg): n/a Dose of Dobutamine: n/a mcg/kg/min (at max HR)  Stress Test Technologist: Milana Na, EMT-P  Nuclear Technologist:  Domenic Polite, CNMT     Rest Procedure:  Myocardial perfusion imaging was performed at rest 45 minutes following the intravenous administration of Technetium 92m Tetrofosmin. Rest ECG: NSR - Normal EKG  Stress Procedure:   The patient received IV Lexiscan 0.4 mg over 15-seconds.  Technetium 65m Tetrofosmin injected at 30-seconds.  There were no significant changes with Lexiscan.  Quantitative spect images were obtained after a 45 minute delay. Stress ECG: No significant change from baseline ECG  QPS Raw Data Images:  Normal; no motion artifact; normal heart/lung ratio. Stress Images:  Normal homogeneous uptake in all areas of the myocardium. Rest Images:  Normal homogeneous uptake in all areas of the myocardium. Subtraction (SDS):  There is no evidence of scar or ischemia. Transient Ischemic Dilatation (Normal <1.22):  1.12 Lung/Heart Ratio (Normal <0.45):  0.41  Quantitative Gated Spect Images QGS EDV:  98 ml QGS ESV:  40 ml  Impression Exercise Capacity:  Lexiscan with no exercise. BP Response:  Normal blood pressure response. Clinical Symptoms:  Chest pressure, lightheaded.  ECG Impression:  No significant ST segment change suggestive of ischemia. Comparison with Prior Nuclear Study: No images to compare  Overall Impression:  Normal stress nuclear study.  LV Ejection Fraction: 59%.  LV Wall Motion:  NL LV Function; NL Wall Motion  Marca Ancona 04/13/2012

## 2012-04-14 ENCOUNTER — Telehealth: Payer: Self-pay | Admitting: Cardiology

## 2012-04-14 NOTE — Telephone Encounter (Signed)
New problem:  Patient wife calling regarding husband test results.

## 2012-04-14 NOTE — Telephone Encounter (Signed)
Spoke with Roger Phillips, aware of nuclear results

## 2012-04-15 NOTE — Addendum Note (Signed)
Addended by: Lisabeth Devoid F on: 04/15/2012 09:45 AM   Modules accepted: Orders

## 2012-07-15 ENCOUNTER — Ambulatory Visit (INDEPENDENT_AMBULATORY_CARE_PROVIDER_SITE_OTHER): Payer: PRIVATE HEALTH INSURANCE | Admitting: Adult Health

## 2012-07-15 VITALS — BP 130/82 | HR 70 | Temp 97.3°F | Ht 72.0 in | Wt 245.2 lb

## 2012-07-15 DIAGNOSIS — Z23 Encounter for immunization: Secondary | ICD-10-CM

## 2012-07-15 DIAGNOSIS — R911 Solitary pulmonary nodule: Secondary | ICD-10-CM

## 2012-07-15 DIAGNOSIS — J449 Chronic obstructive pulmonary disease, unspecified: Secondary | ICD-10-CM

## 2012-07-15 NOTE — Patient Instructions (Addendum)
Continue on current regimen .  Must quit smoking .  Flu shot today  Follow up with Dr. Vassie Loll in 6 months and As needed

## 2012-07-20 NOTE — Assessment & Plan Note (Signed)
Compensated on present regimen   Plan Continue on current regimen .  Must quit smoking .  Flu shot today  Follow up with Dr. Vassie Loll in 6 months and As needed

## 2012-07-21 NOTE — Progress Notes (Signed)
Subjective:    Patient ID: Roger Phillips, male    DOB: Apr 27, 1956, 56 y.o.   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  Accompanied by wife, [name] who is CNA at [employer].  He reported blood tinged sputum on occasion over the past few months, last such episode 2 wks ago. His brother had throat cancer, he denies change in voice, hoarseness or sensation of a lump.  He has been on adviar & ventolin, spiriva was recetly added, completed course of cipro & prednisone.  With chantix, he smokes 1 PPd, down from 1.5 ppD  He binge drinks on the weekends & hemoptysis is worse after this.  Spirometry showed FEv1 53% (2.1) & FVC of 55%, ratio 75.  CXR /30/12 was wnl  CBC showed polycythemia with Hb 17  Bscopy - no endobronchial lesions  Pulm nodules & 1.2 cm subcarinal LN were first noted on a CT scan in 6/11. ON a FU CT in oct'11, the LN had decresaed in size & the LUL nodule had resolved, others were stable.  CT 8/12 >>Previously seen right upper lobe pulmonary nodule is slightly less conspicuous, 4 mm current versus 5 mm previous. Additional tiny questionable nodular foci in both lungs are also  unchanged, with two additional tiny questionable nodules at right upper right lower lobes not definitely seen on previous study      01/12/2012 2 mth FU Underwent  cervical spine decompression surgery  - Dr Newell Coral  He reports sinus drainage Continues to smoke 1/2 PPD, has cut down on alcohol - no h/s/o withdrawal  No nocturnal wheeze, chest pain, orthopnea or PND  Pt states his breathing has been doing good. C/o cough w/ clear phlem, PND, very little wheezing Ct chest 4/13  - unchanged nodules, coronary calcifications >>no changes   07/21/2012 Follow up 6 mth f/u - Wants flu shot - SOB about the same -  No flare in cough or wheezing  We discussed smoking cessation in detail.  Doing well on Advair Twice daily   No  hemoptysis , chest pain , edema or weight loss.   Review of Systems Constitutional:   No  weight loss, night sweats,  Fevers, chills,  +fatigue, or  lassitude.  HEENT:   No headaches,  Difficulty swallowing,  Tooth/dental problems, or  Sore throat,                No sneezing, itching, ear ache, nasal congestion, post nasal drip,   CV:  No chest pain,  Orthopnea, PND, swelling in lower extremities, anasarca, dizziness, palpitations, syncope.   GI  No heartburn, indigestion, abdominal pain, nausea, vomiting, diarrhea, change in bowel habits, loss of appetite, bloody stools.   Resp:   No coughing up of blood.  No change in color of mucus.  No wheezing.  No chest wall deformity  Skin: no rash or lesions.  GU: no dysuria, change in color of urine, no urgency or frequency.  No flank pain, no hematuria   MS:  No joint pain or swelling.  No decreased range of motion.  No back pain.  Psych:  No change in mood or affect. No depression or anxiety.  No memory loss.         Objective:   Physical Exam  Gen. Pleasant, well-nourished, in no distress ENT - no lesions, no post nasal drip Neck: No JVD, no thyromegaly, no carotid  bruits Lungs: no use of accessory muscles, no dullness to percussion, clear without rales or rhonchi  Cardiovascular: Rhythm regular, heart sounds  normal, no murmurs or gallops, no peripheral edema Musculoskeletal: No deformities, no cyanosis or clubbing        Assessment & Plan:

## 2012-07-21 NOTE — Assessment & Plan Note (Signed)
Stable on last CT chest 4/13 Repeat 01/2013

## 2013-01-27 ENCOUNTER — Encounter: Payer: Self-pay | Admitting: Pulmonary Disease

## 2013-01-27 ENCOUNTER — Ambulatory Visit (INDEPENDENT_AMBULATORY_CARE_PROVIDER_SITE_OTHER): Payer: PRIVATE HEALTH INSURANCE | Admitting: Pulmonary Disease

## 2013-01-27 VITALS — BP 126/78 | HR 71 | Temp 97.5°F | Ht 72.0 in | Wt 251.2 lb

## 2013-01-27 DIAGNOSIS — R911 Solitary pulmonary nodule: Secondary | ICD-10-CM

## 2013-01-27 DIAGNOSIS — J449 Chronic obstructive pulmonary disease, unspecified: Secondary | ICD-10-CM

## 2013-01-27 DIAGNOSIS — F17200 Nicotine dependence, unspecified, uncomplicated: Secondary | ICD-10-CM

## 2013-01-27 DIAGNOSIS — Z720 Tobacco use: Secondary | ICD-10-CM

## 2013-01-27 NOTE — Assessment & Plan Note (Signed)
Smoking cessation remains most important intervention

## 2013-01-27 NOTE — Patient Instructions (Addendum)
Schedule Ct scan in [place] You HAVE to quit smoking- lung function is down ot 50%

## 2013-01-27 NOTE — Progress Notes (Signed)
  Subjective:    Patient ID: Roger Phillips, male    DOB: December 04, 1955, 57 y.o.   MRN: [id number]  HPI   PCP- Lilyan Punt   56/M, smoker , for FU of gold stg 2 COPD & pulm nodules.  Accompanied by wife, Roger Phillips who is CNA at [employer].   Spirometry showed FEv1 53% (2.1) & FVC of 55%, ratio 75.  CXR /30/12 was wnl  CBC showed polycythemia with Hb 17  Presented with blood tinged hemoptysis in 2012 -Bscopy - no endobronchial lesions  Pulm nodules & 1.2 cm subcarinal LN were first noted on a CT scan in 6/11. ON a FU CT in oct'11, the LN had decresaed in size & the LUL nodule had resolved, others were stable.  Underwent cervical spine decompression surgery in 2013 - Dr Newell Coral   Ct chest 4/13 - unchanged nodules, coronary calcifications    01/27/2013  Pt reports breathing has been doing "pretty good". C/o slight cough d/t pollen, occasional wheezing depending on certain activities and at rest but rescue inhaler helps.  Continues ot smoke 1 PPD - chantix caused him to feel 'moody' Dyspnea is improved -c an walk a mile when he goes [place] hunting Drinks on weekends  Review of Systems neg for any significant sore throat, dysphagia, itching, sneezing, nasal congestion or excess/ purulent secretions, fever, chills, sweats, unintended wt loss, pleuritic or exertional cp, hempoptysis, orthopnea pnd or change in chronic leg swelling. Also denies presyncope, palpitations, heartburn, abdominal pain, nausea, vomiting, diarrhea or change in bowel or urinary habits, dysuria,hematuria, rash, arthralgias, visual complaints, headache, numbness weakness or ataxia.     Objective:   Physical Exam  Gen. Pleasant, obese, in no distress ENT - no lesions, no post nasal drip Neck: No JVD, no thyromegaly, no carotid bruits Lungs: no use of accessory muscles, no dullness to percussion, decreased without rales or rhonchi  Cardiovascular: Rhythm regular, heart sounds  normal, no murmurs or gallops, no peripheral  edema Musculoskeletal: No deformities, no cyanosis or clubbing , no tremors        Assessment & Plan:

## 2013-01-27 NOTE — Assessment & Plan Note (Signed)
Ct advair Albuterol prn Smoking cessation

## 2013-01-27 NOTE — Assessment & Plan Note (Signed)
FU CT scan for 1 yr stability

## 2013-01-28 ENCOUNTER — Ambulatory Visit (HOSPITAL_COMMUNITY)
Admission: RE | Admit: 2013-01-28 | Discharge: 2013-01-28 | Disposition: A | Discharge status: Home / Self Care | Payer: PRIVATE HEALTH INSURANCE | Source: Ambulatory Visit | Attending: Pulmonary Disease | Admitting: Pulmonary Disease

## 2013-01-28 DIAGNOSIS — R911 Solitary pulmonary nodule: Secondary | ICD-10-CM

## 2013-01-28 DIAGNOSIS — R918 Other nonspecific abnormal finding of lung field: Secondary | ICD-10-CM | POA: Insufficient documentation

## 2013-01-28 DIAGNOSIS — Z09 Encounter for follow-up examination after completed treatment for conditions other than malignant neoplasm: Secondary | ICD-10-CM | POA: Insufficient documentation

## 2013-02-01 ENCOUNTER — Encounter: Payer: Self-pay | Admitting: Pulmonary Disease

## 2013-02-01 ENCOUNTER — Telehealth: Payer: Self-pay | Admitting: Pulmonary Disease

## 2013-02-01 NOTE — Telephone Encounter (Signed)
I spoke with patient about results and he verbalized understanding and had no questions 

## 2013-02-01 NOTE — Telephone Encounter (Deleted)
lmtcb x1 for pt. 

## 2013-02-21 ENCOUNTER — Other Ambulatory Visit (HOSPITAL_COMMUNITY): Payer: Self-pay | Admitting: Family Medicine

## 2013-02-22 ENCOUNTER — Other Ambulatory Visit: Payer: Self-pay | Admitting: *Deleted

## 2013-02-22 MED ORDER — FLUTICASONE PROPIONATE 50 MCG/ACT NA SUSP: 2.0000 | Freq: Every day | NASAL | Status: DC

## 2013-03-26 ENCOUNTER — Other Ambulatory Visit: Payer: Self-pay | Admitting: Family Medicine

## 2013-03-28 ENCOUNTER — Encounter: Payer: Self-pay | Admitting: Family Medicine

## 2013-03-28 ENCOUNTER — Ambulatory Visit (INDEPENDENT_AMBULATORY_CARE_PROVIDER_SITE_OTHER): Payer: PRIVATE HEALTH INSURANCE | Admitting: Family Medicine

## 2013-03-28 VITALS — BP 124/82 | HR 76 | Temp 98.0°F | Resp 16 | Wt 251.0 lb

## 2013-03-28 DIAGNOSIS — R5381 Other malaise: Secondary | ICD-10-CM

## 2013-03-28 DIAGNOSIS — E1165 Type 2 diabetes mellitus with hyperglycemia: Secondary | ICD-10-CM | POA: Insufficient documentation

## 2013-03-28 DIAGNOSIS — Z79899 Other long term (current) drug therapy: Secondary | ICD-10-CM

## 2013-03-28 DIAGNOSIS — I10 Essential (primary) hypertension: Secondary | ICD-10-CM

## 2013-03-28 DIAGNOSIS — E785 Hyperlipidemia, unspecified: Secondary | ICD-10-CM

## 2013-03-28 DIAGNOSIS — E119 Type 2 diabetes mellitus without complications: Secondary | ICD-10-CM

## 2013-03-28 DIAGNOSIS — K8020 Calculus of gallbladder without cholecystitis without obstruction: Secondary | ICD-10-CM

## 2013-03-28 DIAGNOSIS — J449 Chronic obstructive pulmonary disease, unspecified: Secondary | ICD-10-CM

## 2013-03-28 LAB — POCT GLYCOSYLATED HEMOGLOBIN (HGB A1C): Hemoglobin A1C: 9

## 2013-03-28 MED ORDER — GLIPIZIDE 5 MG PO TABS: ORAL_TABLET | ORAL | Status: DC

## 2013-03-28 MED ORDER — FLUTICASONE-SALMETEROL 500-50 MCG/DOSE IN AEPB: 1.0000 | INHALATION_SPRAY | Freq: Two times a day (BID) | RESPIRATORY_TRACT | Status: DC

## 2013-03-28 NOTE — Progress Notes (Signed)
Subjective:    Patient ID: Roger Phillips, male    DOB: [birth year], 57 y.o.   MRN: [id number]  Diabetes He presents for his follow-up diabetic visit. He has type 2 diabetes mellitus. His disease course has been stable. There are no hypoglycemic associated symptoms. Pertinent negatives for hypoglycemia include no headaches. There are no diabetic associated symptoms. Pertinent negatives for diabetes include no chest pain and no weakness. There are no hypoglycemic complications. Symptoms are stable. There are no diabetic complications. There are no known risk factors for coronary artery disease. Current diabetic treatment includes oral agent (monotherapy). He is compliant with treatment all of the time. He is following a generally healthy diet. When asked about meal planning, he reported none.  Cough This is a recurrent problem. The current episode started in the past 7 days. The problem has been gradually worsening. The problem occurs constantly. The cough is productive of sputum. Associated symptoms include shortness of breath. Pertinent negatives include no chest pain, fever, headaches, rash, rhinorrhea or wheezing. The symptoms are aggravated by fumes. He has tried steroid inhaler for the symptoms. The treatment provided no relief. His past medical history is significant for COPD. There is no history of environmental allergies.   this patient has COPD. He denies any fever he does relate coughing the coughing is worse over the past week he wonders if there is a stronger Advair. He also relates that he still smokes has not cut back. He knows he needs to quit. This is been told them more than once.  Diabetes-patient does not check his sugars on a regular basis he does not follow a low starch approach he's been counseled to do so. Patient also relates high cost of all his medications is difficult  Blood pressure initially elevated when he came in but now is doing better. He relates he does take his  medicines. Unfortunately he eats more salt than what he needs to. Patient relates he is compliant with his statins. He is also compliant with his reflux medicine. He states that's under good control. He states he does not do some walking on a regular basis he relates that his heels bother him at times Past medical history reflux, diabetes, COPD, allergies, hypertension, elevated risk of cardiovascular disease, smoker Family history reviewed. Social history reviewed.   Review of Systems  Constitutional: Negative for fever, activity change and appetite change.  HENT: Negative for congestion, rhinorrhea and neck pain.   Eyes: Negative for discharge.  Respiratory: Positive for cough and shortness of breath. Negative for wheezing.   Cardiovascular: Negative for chest pain.  Gastrointestinal: Negative for vomiting, abdominal pain and blood in stool.  Genitourinary: Negative for frequency and difficulty urinating.  Skin: Negative for rash.  Allergic/Immunologic: Negative for environmental allergies and food allergies.  Neurological: Negative for weakness and headaches.  Psychiatric/Behavioral: Negative for agitation.       Objective:   Physical Exam  Constitutional: He appears well-developed and well-nourished.  HENT:  Head: Normocephalic and atraumatic.  Nose: Nose normal.  Mouth/Throat: Oropharynx is clear and moist.  Eyes: EOM are normal. Pupils are equal, round, and reactive to light.  Neck: Normal range of motion. Neck supple. No thyromegaly present.  Cardiovascular: Normal rate, regular rhythm and normal heart sounds.   No murmur heard. Pulmonary/Chest: Effort normal. No respiratory distress. He has wheezes (moderate wheezing no crackles heard).  Abdominal: Soft. Bowel sounds are normal. He exhibits no distension and no mass. There is no tenderness.  Musculoskeletal: Normal  range of motion. He exhibits no edema.  Lymphadenopathy:    He has no cervical adenopathy.  Neurological: He  is alert. He exhibits normal muscle tone.  Skin: Skin is warm and dry. No erythema.  Psychiatric: He has a normal mood and affect. His behavior is normal. Judgment normal.          Assessment & Plan:  #1 COPD-increase Advair. 500/50 one inhalation twice daily. Albuterol when necessary. Also albuterol nebulizer machine every 4 hours when necessary wheezing. Call us if progressive troubles. #2 poorly controlled diabetes-I. encourage patient to make some changes on his part he stated he would reduce some of the starches in his diet try to be more active and do a better job of taking medicine checking his sugars periodically. We will see him back in 3 months check hemoglobin A1c at that point #3 hypertension decent control #4 no cardio symptoms currently #5 counseled him to quit smoking. 25-30 minutes was spent with this patient. [id number]

## 2013-03-28 NOTE — Patient Instructions (Signed)
Use Glipizide 1/2 tablet in am and 1/2 tablet at supper Check some sugars over the next 2 weeks Goals- am 100-130   Evening goal <160 If not meeting goals in 2 weeks go to 1 tab twice a day Give [place] update on meds and glucose in 3 to 4 weeks Recheck here in 3months to check HgA1C     Diabetes Meal Planning Guide The diabetes meal planning guide is a tool to help you plan your meals and snacks. It is important for people with diabetes to manage their blood glucose (sugar) levels. Choosing the right foods and the right amounts throughout your day will help control your blood glucose. Eating right can even help you improve your blood pressure and reach or maintain a healthy weight. CARBOHYDRATE COUNTING MADE EASY When you eat carbohydrates, they turn to sugar. This raises your blood glucose level. Counting carbohydrates can help you control this level so you feel better. When you plan your meals by counting carbohydrates, you can have more flexibility in what you eat and balance your medicine with your food intake. Carbohydrate counting simply means adding up the total amount of carbohydrate grams in your meals and snacks. Try to eat about the same amount at each meal. Foods with carbohydrates are listed below. Each portion below is 1 carbohydrate serving or 15 grams of carbohydrates. Ask your dietician how many grams of carbohydrates you should eat at each meal or snack. Grains and Starches  1 slice bread.   English muffin or hotdog/hamburger bun.   cup cold cereal (unsweetened).   cup cooked pasta or rice.   cup starchy vegetables (corn, potatoes, peas, beans, winter squash).  1 tortilla (6 inches).   bagel.  1 waffle or pancake (size of a CD).   cup cooked cereal.  4 to 6 small crackers. *Whole grain is recommended. Fruit  1 cup fresh unsweetened berries, melon, papaya, pineapple.  1 small fresh fruit.   banana or mango.   cup fruit juice (4 oz unsweetened).    cup canned fruit in natural juice or water.  2 tbs dried fruit.  12 to 15 grapes or cherries. Milk and Yogurt  1 cup fat-free or 1% milk.  1 cup soy milk.  6 oz light yogurt with sugar-free sweetener.  6 oz low-fat soy yogurt.  6 oz plain yogurt. Vegetables  1 cup raw or  cup cooked is counted as 0 carbohydrates or a "free" food.  If you eat 3 or more servings at 1 meal, count them as 1 carbohydrate serving. Other Carbohydrates   oz chips or pretzels.   cup ice cream or frozen yogurt.   cup sherbet or sorbet.  2 inch square cake, no frosting.  1 tbs honey, sugar, jam, jelly, or syrup.  2 small cookies.  3 squares of graham crackers.  3 cups popcorn.  6 crackers.  1 cup broth-based soup.  Count 1 cup casserole or other mixed foods as 2 carbohydrate servings.  Foods with less than 20 calories in a serving may be counted as 0 carbohydrates or a "free" food. You may want to purchase a book or computer software that lists the carbohydrate gram counts of different foods. In addition, the nutrition facts panel on the labels of the foods you eat are a good source of this information. The label will tell you how big the serving size is and the total number of carbohydrate grams you will be eating per serving. Divide this number by 15  to obtain the number of carbohydrate servings in a portion. Remember, 1 carbohydrate serving equals 15 grams of carbohydrate. SERVING SIZES Measuring foods and serving sizes helps you make sure you are getting the right amount of food. The list below tells how big or small some common serving sizes are.  1 oz.........4 stacked dice.  3 oz........Marland KitchenDeck of cards.  1 tsp.......Marland KitchenTip of little finger.  1 tbs......Marland KitchenMarland KitchenThumb.  2 tbs.......Marland KitchenGolf ball.   cup......Marland KitchenHalf of a fist.  1 cup.......Marland KitchenA fist. SAMPLE DIABETES MEAL PLAN Below is a sample meal plan that includes foods from the grain and starches, dairy, vegetable, fruit, and meat  groups. A dietician can individualize a meal plan to fit your calorie needs and tell you the number of servings needed from each food group. However, controlling the total amount of carbohydrates in your meal or snack is more important than making sure you include all of the food groups at every meal. You may interchange carbohydrate containing foods (dairy, starches, and fruits). The meal plan below is an example of a 2000 calorie diet using carbohydrate counting. This meal plan has 17 carbohydrate servings. Breakfast  1 cup oatmeal (2 carb servings).   cup light yogurt (1 carb serving).  1 cup blueberries (1 carb serving).   cup almonds. Snack  1 large apple (2 carb servings).  1 low-fat string cheese stick. Lunch  Chicken breast salad.  1 cup spinach.   cup chopped tomatoes.  2 oz chicken breast, sliced.  2 tbs low-fat [place] dressing.  12 whole-wheat crackers (2 carb servings).  12 to 15 grapes (1 carb serving).  1 cup low-fat milk (1 carb serving). Snack  1 cup carrots.   cup hummus (1 carb serving). Dinner  3 oz broiled salmon.  1 cup brown rice (3 carb servings). Snack  1  cups steamed broccoli (1 carb serving) drizzled with 1 tsp olive oil and lemon juice.  1 cup light pudding (2 carb servings). DIABETES MEAL PLANNING WORKSHEET Your dietician can use this worksheet to help you decide how many servings of foods and what types of foods are right for you.  BREAKFAST Food Group and Servings / Carb Servings Grain/Starches __________________________________ Dairy __________________________________________ Vegetable ______________________________________ Fruit ___________________________________________ Meat __________________________________________ Fat ____________________________________________ LUNCH Food Group and Servings / Carb Servings Grain/Starches ___________________________________ Dairy ___________________________________________ Fruit  ____________________________________________ Meat ___________________________________________ Fat _____________________________________________ Laural Golden Food Group and Servings / Carb Servings Grain/Starches ___________________________________ Dairy ___________________________________________ Fruit ____________________________________________ Meat ___________________________________________ Fat _____________________________________________ SNACKS Food Group and Servings / Carb Servings Grain/Starches ___________________________________ Dairy ___________________________________________ Vegetable _______________________________________ Fruit ____________________________________________ Meat ___________________________________________ Fat _____________________________________________ DAILY TOTALS Starches _________________________ Vegetable ________________________ Fruit ____________________________ Dairy ____________________________ Meat ____________________________ Fat ______________________________ Document Released: 06/19/2005 Document Revised: 12/15/2011 Document Reviewed: 04/30/2009 Exit[employer] Patient Information 2014 [employer]

## 2013-03-29 ENCOUNTER — Other Ambulatory Visit: Payer: Self-pay | Admitting: Family Medicine

## 2013-03-29 DIAGNOSIS — K8020 Calculus of gallbladder without cholecystitis without obstruction: Secondary | ICD-10-CM

## 2013-03-30 ENCOUNTER — Encounter: Payer: Self-pay | Admitting: Family Medicine

## 2013-03-30 LAB — HEPATIC FUNCTION PANEL
ALT: 22 U/L (ref 0–53)
AST: 13 U/L (ref 0–37)
Bilirubin, Direct: 0.1 mg/dL (ref 0.0–0.3)
Indirect Bilirubin: 0.6 mg/dL (ref 0.0–0.9)

## 2013-03-30 LAB — CBC WITH DIFFERENTIAL/PLATELET
Eosinophils Absolute: 0.3 10*3/uL (ref 0.0–0.7)
Lymphs Abs: 1.8 10*3/uL (ref 0.7–4.0)
MCH: 30.5 pg (ref 26.0–34.0)
Neutro Abs: 4.1 10*3/uL (ref 1.7–7.7)
Neutrophils Relative %: 59 % (ref 43–77)
Platelets: 247 10*3/uL (ref 150–400)
RBC: 5.37 MIL/uL (ref 4.22–5.81)
WBC: 6.9 10*3/uL (ref 4.0–10.5)

## 2013-03-30 LAB — BASIC METABOLIC PANEL
BUN: 15 mg/dL (ref 6–23)
Calcium: 9.3 mg/dL (ref 8.4–10.5)
Glucose, Bld: 136 mg/dL — ABNORMAL HIGH (ref 70–99)
Sodium: 136 mEq/L (ref 135–145)

## 2013-03-30 LAB — LIPID PANEL
Cholesterol: 151 mg/dL (ref 0–200)
Total CHOL/HDL Ratio: 5 Ratio
Triglycerides: 202 mg/dL — ABNORMAL HIGH (ref <150)

## 2013-03-31 LAB — MICROALBUMIN, URINE: Microalb, Ur: 1.51 mg/dL (ref 0.00–1.89)

## 2013-04-13 ENCOUNTER — Ambulatory Visit (INDEPENDENT_AMBULATORY_CARE_PROVIDER_SITE_OTHER): Payer: PRIVATE HEALTH INSURANCE | Admitting: General Surgery

## 2013-04-27 ENCOUNTER — Ambulatory Visit (INDEPENDENT_AMBULATORY_CARE_PROVIDER_SITE_OTHER): Payer: PRIVATE HEALTH INSURANCE | Admitting: General Surgery

## 2013-05-08 ENCOUNTER — Other Ambulatory Visit: Payer: Self-pay | Admitting: Family Medicine

## 2013-05-09 NOTE — Telephone Encounter (Signed)
May refill times 3 

## 2013-06-02 ENCOUNTER — Other Ambulatory Visit: Payer: Self-pay | Admitting: Family Medicine

## 2013-06-16 ENCOUNTER — Other Ambulatory Visit: Payer: Self-pay | Admitting: Family Medicine

## 2013-06-28 ENCOUNTER — Encounter: Payer: Self-pay | Admitting: Family Medicine

## 2013-06-28 ENCOUNTER — Ambulatory Visit (INDEPENDENT_AMBULATORY_CARE_PROVIDER_SITE_OTHER): Payer: PRIVATE HEALTH INSURANCE | Admitting: Family Medicine

## 2013-06-28 VITALS — BP 128/80 | Ht 72.0 in | Wt 251.4 lb

## 2013-06-28 DIAGNOSIS — E1165 Type 2 diabetes mellitus with hyperglycemia: Secondary | ICD-10-CM

## 2013-06-28 DIAGNOSIS — Z125 Encounter for screening for malignant neoplasm of prostate: Secondary | ICD-10-CM

## 2013-06-28 DIAGNOSIS — F17200 Nicotine dependence, unspecified, uncomplicated: Secondary | ICD-10-CM

## 2013-06-28 DIAGNOSIS — Z79899 Other long term (current) drug therapy: Secondary | ICD-10-CM

## 2013-06-28 DIAGNOSIS — Z720 Tobacco use: Secondary | ICD-10-CM

## 2013-06-28 DIAGNOSIS — E785 Hyperlipidemia, unspecified: Secondary | ICD-10-CM

## 2013-06-28 DIAGNOSIS — I10 Essential (primary) hypertension: Secondary | ICD-10-CM

## 2013-06-28 DIAGNOSIS — E119 Type 2 diabetes mellitus without complications: Secondary | ICD-10-CM

## 2013-06-28 MED ORDER — CANAGLIFLOZIN 100 MG PO TABS: 1.0000 | ORAL_TABLET | Freq: Every morning | ORAL | Status: DC

## 2013-06-28 NOTE — Progress Notes (Signed)
  Subjective:    Patient ID: Roger Phillips, male    DOB: April 30, 1956, 57 y.o.   MRN: [id number]  HPI Patient here today for 3 month diabetic check up. He states the last time he took his blood sugar was 2 weeks ago and it was in the 150's.  Patient relates that he try to increase the diabetes medicine but he could not tolerate went back to a half tablet twice per day. He has no concerns.  The patient was seen today as part of a comprehensive diabetic check up. The patient had the following elements completed: -Review of medication compliance -Review of glucose monitoring results -Review of any complications do to high or low sugars -Diabetic foot exam was completed as part of today's visit. The following was also discussed: -Importance of yearly eye exams -Importance of following diabetic/low sugar-starch diet -Importance of exercise and regular activity -Importance of regular followup visits. -Most recent hemoglobin A1c were reviewed with the patient along with goals regarding diabetes.  Did not tolerate higher dose of glipizide Will add Invokana Recheck 3 monthsi8   Review of Systems Denies chest tightness pressure pain vomiting diarrhea denies abdominal pain weight loss. Denies loss of appetite    Objective:   Physical Exam On exam lungs were clear heart was regular pulse normal blood pressure was rechecked it was good abdomen soft obese extremities no edema skin warm dry diabetic foot exam was completed head and neck no masses felt in addition to this prostate normal       Assessment & Plan:  #1 diabetes subpar control start Invokan #2 blood pressure issues under good control. #3 hyperlipidemia continue medication check lab work. #4 check urine micro-protein await results 5 obesity patient encouraged to watch diet exercise bring his weight down #6 patient was cautioned to minimize salts in his diet, he was also encouraged to quit smoking , Also encouraged to get his  colonoscopy she was given on strategies to quit smoking plus also colonoscopy. Patient will followup in 3 months 25 minutes spent with patient [phone]

## 2013-06-28 NOTE — Patient Instructions (Addendum)
See eye doctor yearly due to diabetes  A1C is 8, adding INVOKANA once daily  Goal is 7.0  Follow healthy diet / avoid smoking/ recheck in 3 months  Have [name] set you up for colonoscopy. Very important to do this!!                    Diabetes Meal Planning Guide The diabetes meal planning guide is a tool to help you plan your meals and snacks. It is important for people with diabetes to manage their blood glucose (sugar) levels. Choosing the right foods and the right amounts throughout your day will help control your blood glucose. Eating right can even help you improve your blood pressure and reach or maintain a healthy weight. CARBOHYDRATE COUNTING MADE EASY When you eat carbohydrates, they turn to sugar. This raises your blood glucose level. Counting carbohydrates can help you control this level so you feel better. When you plan your meals by counting carbohydrates, you can have more flexibility in what you eat and balance your medicine with your food intake. Carbohydrate counting simply means adding up the total amount of carbohydrate grams in your meals and snacks. Try to eat about the same amount at each meal. Foods with carbohydrates are listed below. Each portion below is 1 carbohydrate serving or 15 grams of carbohydrates. Ask your dietician how many grams of carbohydrates you should eat at each meal or snack. Grains and Starches  1 slice bread.   English muffin or hotdog/hamburger bun.   cup cold cereal (unsweetened).   cup cooked pasta or rice.   cup starchy vegetables (corn, potatoes, peas, beans, winter squash).  1 tortilla (6 inches).   bagel.  1 waffle or pancake (size of a CD).   cup cooked cereal.  4 to 6 small crackers. *Whole grain is recommended. Fruit  1 cup fresh unsweetened berries, melon, papaya, pineapple.  1 small fresh fruit.   banana or mango.   cup fruit juice (4 oz unsweetened).   cup canned fruit in natural juice or water.  2 tbs  dried fruit.  12 to 15 grapes or cherries. Milk and Yogurt  1 cup fat-free or 1% milk.  1 cup soy milk.  6 oz light yogurt with sugar-free sweetener.  6 oz low-fat soy yogurt.  6 oz plain yogurt. Vegetables  1 cup raw or  cup cooked is counted as 0 carbohydrates or a "free" food.  If you eat 3 or more servings at 1 meal, count them as 1 carbohydrate serving. Other Carbohydrates   oz chips or pretzels.   cup ice cream or frozen yogurt.   cup sherbet or sorbet.  2 inch square cake, no frosting.  1 tbs honey, sugar, jam, jelly, or syrup.  2 small cookies.  3 squares of graham crackers.  3 cups popcorn.  6 crackers.  1 cup broth-based soup.  Count 1 cup casserole or other mixed foods as 2 carbohydrate servings.  Foods with less than 20 calories in a serving may be counted as 0 carbohydrates or a "free" food. You may want to purchase a book or computer software that lists the carbohydrate gram counts of different foods. In addition, the nutrition facts panel on the labels of the foods you eat are a good source of this information. The label will tell you how big the serving size is and the total number of carbohydrate grams you will be eating per serving. Divide this number by 15 to obtain  the number of carbohydrate servings in a portion. Remember, 1 carbohydrate serving equals 15 grams of carbohydrate. SERVING SIZES Measuring foods and serving sizes helps you make sure you are getting the right amount of food. The list below tells how big or small some common serving sizes are.  1 oz.........4 stacked dice.  3 oz........Marland KitchenDeck of cards.  1 tsp.......Marland KitchenTip of little finger.  1 tbs......Marland KitchenMarland KitchenThumb.  2 tbs.......Marland KitchenGolf ball.   cup......Marland KitchenHalf of a fist.  1 cup.......Marland KitchenA fist. SAMPLE DIABETES MEAL PLAN Below is a sample meal plan that includes foods from the grain and starches, dairy, vegetable, fruit, and meat groups. A dietician can individualize a meal plan to  fit your calorie needs and tell you the number of servings needed from each food group. However, controlling the total amount of carbohydrates in your meal or snack is more important than making sure you include all of the food groups at every meal. You may interchange carbohydrate containing foods (dairy, starches, and fruits). The meal plan below is an example of a 2000 calorie diet using carbohydrate counting. This meal plan has 17 carbohydrate servings. Breakfast  1 cup oatmeal (2 carb servings).   cup light yogurt (1 carb serving).  1 cup blueberries (1 carb serving).   cup almonds. Snack  1 large apple (2 carb servings).  1 low-fat string cheese stick. Lunch  Chicken breast salad.  1 cup spinach.   cup chopped tomatoes.  2 oz chicken breast, sliced.  2 tbs low-fat [place] dressing.  12 whole-wheat crackers (2 carb servings).  12 to 15 grapes (1 carb serving).  1 cup low-fat milk (1 carb serving). Snack  1 cup carrots.   cup hummus (1 carb serving). Dinner  3 oz broiled salmon.  1 cup brown rice (3 carb servings). Snack  1  cups steamed broccoli (1 carb serving) drizzled with 1 tsp olive oil and lemon juice.  1 cup light pudding (2 carb servings). DIABETES MEAL PLANNING WORKSHEET Your dietician can use this worksheet to help you decide how many servings of foods and what types of foods are right for you.  BREAKFAST Food Group and Servings / Carb Servings Grain/Starches __________________________________ Dairy __________________________________________ Vegetable ______________________________________ Fruit ___________________________________________ Meat __________________________________________ Fat ____________________________________________ LUNCH Food Group and Servings / Carb Servings Grain/Starches ___________________________________ Dairy ___________________________________________ Fruit ____________________________________________ Meat  ___________________________________________ Fat _____________________________________________ Roger Phillips Food Group and Servings / Carb Servings Grain/Starches ___________________________________ Dairy ___________________________________________ Fruit ____________________________________________ Meat ___________________________________________ Fat _____________________________________________ SNACKS Food Group and Servings / Carb Servings Grain/Starches ___________________________________ Dairy ___________________________________________ Vegetable _______________________________________ Fruit ____________________________________________ Meat ___________________________________________ Fat _____________________________________________ DAILY TOTALS Starches _________________________ Vegetable ________________________ Fruit ____________________________ Dairy ____________________________ Meat ____________________________ Fat ______________________________ Document Released: 06/19/2005 Document Revised: 12/15/2011 Document Reviewed: 04/30/2009 Exit[employer] Patient Information 2014 [place]

## 2013-06-30 ENCOUNTER — Ambulatory Visit (INDEPENDENT_AMBULATORY_CARE_PROVIDER_SITE_OTHER): Payer: PRIVATE HEALTH INSURANCE | Admitting: Family Medicine

## 2013-06-30 ENCOUNTER — Encounter: Payer: Self-pay | Admitting: Family Medicine

## 2013-06-30 VITALS — BP 132/88 | Temp 98.3°F | Ht 72.0 in | Wt 251.0 lb

## 2013-06-30 DIAGNOSIS — Z23 Encounter for immunization: Secondary | ICD-10-CM

## 2013-06-30 DIAGNOSIS — H6011 Cellulitis of right external ear: Secondary | ICD-10-CM

## 2013-06-30 DIAGNOSIS — H60399 Other infective otitis externa, unspecified ear: Secondary | ICD-10-CM

## 2013-06-30 MED ORDER — HYDROCODONE-ACETAMINOPHEN 7.5-325 MG PO TABS: 1.0000 | ORAL_TABLET | Freq: Four times a day (QID) | ORAL | Status: DC | PRN

## 2013-06-30 MED ORDER — CIPROFLOXACIN HCL 500 MG PO TABS: 500.0000 mg | ORAL_TABLET | Freq: Two times a day (BID) | ORAL | Status: DC

## 2013-06-30 MED ORDER — NEOMYCIN-POLYMYXIN-HC 3.5-10000-1 OT SOLN: 3.0000 [drp] | Freq: Three times a day (TID) | OTIC | Status: AC

## 2013-06-30 NOTE — Progress Notes (Signed)
  Subjective:    Patient ID: Roger Phillips, male    DOB: Oct 16, 1955, 57 y.o.   MRN: [id number]  Otalgia  There is pain in the right ear. This is a new problem. The current episode started in the past 7 days. The problem occurs constantly. There has been no fever. Associated symptoms include headaches and a sore throat. He has tried acetaminophen for the symptoms. The treatment provided no relief.   Patient does have diabetes background. Relates pain discomfort swelling in the right ear. Denies fever chills nausea vomiting headache.   Review of Systems  HENT: Positive for ear pain and sore throat.   Neurological: Positive for headaches.       Objective:   Physical Exam  Ear canal swollen shut left ear normal moderate tenderness on the right ear no sign of any abscess neck is supple lungs clear heart regular      Assessment & Plan:  Ear pain/otitis externa with some cellulitis-patient was warned that if he has increasing pain fever chills nausea vomiting he needs to go to the ER otherwise Cipro 500 twice a day for the next 10 days plus also Cortisporin Otic drops and hydrocodone 7.5 for pain cautioned drowsiness patient voices understanding wife is with him

## 2013-07-07 LAB — HEPATIC FUNCTION PANEL
Alkaline Phosphatase: 56 U/L (ref 39–117)
Indirect Bilirubin: 0.4 mg/dL (ref 0.0–0.9)
Total Bilirubin: 0.5 mg/dL (ref 0.3–1.2)

## 2013-07-07 LAB — LIPID PANEL: LDL Cholesterol: 84 mg/dL (ref 0–99)

## 2013-07-07 LAB — BASIC METABOLIC PANEL
BUN: 16 mg/dL (ref 6–23)
CO2: 30 mEq/L (ref 19–32)
Chloride: 99 mEq/L (ref 96–112)
Creat: 1.12 mg/dL (ref 0.50–1.35)

## 2013-07-10 ENCOUNTER — Encounter: Payer: Self-pay | Admitting: Family Medicine

## 2013-08-04 ENCOUNTER — Ambulatory Visit: Payer: PRIVATE HEALTH INSURANCE | Admitting: Pulmonary Disease

## 2013-08-15 ENCOUNTER — Other Ambulatory Visit: Payer: Self-pay | Admitting: Family Medicine

## 2013-09-09 ENCOUNTER — Encounter: Payer: Self-pay | Admitting: Pulmonary Disease

## 2013-09-09 ENCOUNTER — Ambulatory Visit (INDEPENDENT_AMBULATORY_CARE_PROVIDER_SITE_OTHER): Payer: PRIVATE HEALTH INSURANCE | Admitting: Pulmonary Disease

## 2013-09-09 VITALS — BP 132/76 | HR 63 | Temp 98.3°F | Ht 72.0 in | Wt 241.8 lb

## 2013-09-09 DIAGNOSIS — J449 Chronic obstructive pulmonary disease, unspecified: Secondary | ICD-10-CM

## 2013-09-09 DIAGNOSIS — R911 Solitary pulmonary nodule: Secondary | ICD-10-CM

## 2013-09-09 NOTE — Assessment & Plan Note (Signed)
Smoking cessation remains primary intervention here Otherwise he can continue Advair You have to quit Smoking  Trial of nicotrol inhaler - Can get started on nicotine patch If this does not work, Ok to Financial risk analyst cigarette

## 2013-09-09 NOTE — Assessment & Plan Note (Signed)
1 yr FU CT in 4/15

## 2013-09-09 NOTE — Patient Instructions (Signed)
CT scan in  2015 You have to quit Smoking  Trial of nicotrol inhaler - Can get started on nicotine patch If this does not work, Ok to Financial risk analyst cigarette

## 2013-09-09 NOTE — Progress Notes (Signed)
   Subjective:    Patient ID: Roger Phillips, male    DOB: 1956-10-02, 57 y.o.   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  Accompanied by wife, Roger Phillips who is CNA at [employer].  Spirometry showed FEv1 53% (2.1) & FVC of 55%, ratio 75.  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 ON a FU CT in oct'11, the LN had decreased in size & the LUL nodule had resolved, others were stable. Stable on FU CT Underwent cervical spine decompression surgery in 2013 - Dr Newell Coral     09/09/2013  Chief Complaint  Patient presents with  . Follow-up    Pt reports his breathing is okay. C/o slight productive cough w/ clear phlem (at times brown phlem). Denies any wheezing and no chest tx   CT chest 01/2013 Stable to slightly improved small bilateral lung nodules, as described above. These are stable to slightly improved since 03/21/2010, compatible with a benign process  Continues to smoke 1 PPD - chantix caused him to feel 'moody'  Dyspnea is improved -c an walk a mile when he goes [place] hunting  Drinks on weekends    Review of Systems neg for any significant sore throat, dysphagia, itching, sneezing, nasal congestion or excess/ purulent secretions, fever, chills, sweats, unintended wt loss, pleuritic or exertional cp, hempoptysis, orthopnea pnd or change in chronic leg swelling. Also denies presyncope, palpitations, heartburn, abdominal pain, nausea, vomiting, diarrhea or change in bowel or urinary habits, dysuria,hematuria, rash, arthralgias, visual complaints, headache, numbness weakness or ataxia.     Objective:   Physical Exam  Gen. Pleasant, well-nourished, in no distress ENT - no lesions, no post nasal drip Neck: No JVD, no thyromegaly, no carotid bruits Lungs: no use of accessory muscles,  no dullness to percussion, clear without rales or rhonchi  Cardiovascular: Rhythm regular, heart sounds  normal, no murmurs or gallops, no peripheral edema Musculoskeletal: No deformities, no cyanosis or clubbing        Assessment & Plan:

## 2013-09-26 ENCOUNTER — Encounter: Payer: Self-pay | Admitting: Family Medicine

## 2013-09-26 ENCOUNTER — Ambulatory Visit (INDEPENDENT_AMBULATORY_CARE_PROVIDER_SITE_OTHER): Payer: PRIVATE HEALTH INSURANCE | Admitting: Family Medicine

## 2013-09-26 VITALS — BP 130/90 | Ht 72.0 in | Wt 243.0 lb

## 2013-09-26 DIAGNOSIS — M545 Low back pain, unspecified: Secondary | ICD-10-CM | POA: Insufficient documentation

## 2013-09-26 MED ORDER — HYDROCODONE-ACETAMINOPHEN 7.5-325 MG PO TABS: 1.0000 | ORAL_TABLET | Freq: Four times a day (QID) | ORAL | Status: DC | PRN

## 2013-09-26 NOTE — Progress Notes (Signed)
   Subjective:    Patient ID: Roger Phillips, male    DOB: 1955-11-06, 57 y.o.   MRN: [id number]  Back Pain This is a new problem. The current episode started 1 to 4 weeks ago. The problem occurs intermittently. The problem has been gradually improving since onset. The pain is present in the lumbar spine. The quality of the pain is described as aching and shooting. The pain does not radiate. The pain is at a severity of 3/10. The pain is moderate. The pain is the same all the time. Stiffness is present all day. He has tried nothing for the symptoms.   PMH COPD heart disease   Review of Systems  Musculoskeletal: Positive for back pain.       Objective:   Physical Exam  Lungs are clear heart is regular low back mild tenderness negative straight leg raise patient is fairly stiff abdomen soft     Assessment & Plan:  Back strain-anti-inflammatory on a regular basis as needed over the next 1-2 weeks pain medication prescribed use sparingly cautioned drowsiness if not improving over the next several weeks followup I do not feel the patient needs MRI currently

## 2013-09-26 NOTE — Patient Instructions (Signed)
Ibuprofen 200mg , take 3 pills three times a day for 5 days

## 2013-10-06 LAB — HM DIABETES EYE EXAM

## 2013-11-01 ENCOUNTER — Ambulatory Visit (INDEPENDENT_AMBULATORY_CARE_PROVIDER_SITE_OTHER): Payer: Medicare PPO | Admitting: Family Medicine

## 2013-11-01 ENCOUNTER — Encounter: Payer: Self-pay | Admitting: Family Medicine

## 2013-11-01 VITALS — BP 124/82 | Ht 72.0 in | Wt 246.4 lb

## 2013-11-01 DIAGNOSIS — IMO0001 Reserved for inherently not codable concepts without codable children: Secondary | ICD-10-CM

## 2013-11-01 DIAGNOSIS — IMO0002 Reserved for concepts with insufficient information to code with codable children: Secondary | ICD-10-CM

## 2013-11-01 DIAGNOSIS — E1165 Type 2 diabetes mellitus with hyperglycemia: Secondary | ICD-10-CM

## 2013-11-01 DIAGNOSIS — E785 Hyperlipidemia, unspecified: Secondary | ICD-10-CM

## 2013-11-01 DIAGNOSIS — E119 Type 2 diabetes mellitus without complications: Secondary | ICD-10-CM

## 2013-11-01 DIAGNOSIS — I10 Essential (primary) hypertension: Secondary | ICD-10-CM

## 2013-11-01 LAB — POCT GLYCOSYLATED HEMOGLOBIN (HGB A1C): Hemoglobin A1C: 7.2

## 2013-11-01 NOTE — Progress Notes (Signed)
   Subjective:    Patient ID: Roger Phillips, male    DOB: 13-Aug-1956, 58 y.o.   MRN: [id number]  HPI Patient arrives for a follow up on diabetes. Patient request that his diabetes med be changed. Patient stated that the diabetes med is too expensive under new insurance. We talked at length about his diabetes. His A1c actually much better than what was we will need to have him on some additional medications currently the Invokana is not covered. So therefore we will need to try something different we'll try to find his formulary to find out what is covered under this formulary.  I talked with patient about the importance of trying to quit smoking importance of walking exercise and keeping his weight in check.  Review of Systems     Objective:   Physical Exam  Constitutional: He appears well-developed and well-nourished.  HENT:  Head: Normocephalic and atraumatic.  Neck: Normal range of motion. Neck supple. No thyromegaly present.  Cardiovascular: Normal rate, regular rhythm and normal heart sounds.   No murmur heard. Pulmonary/Chest: Effort normal and breath sounds normal. No respiratory distress. He has no wheezes.  Abdominal: Soft. Bowel sounds are normal. He exhibits no distension and no mass. There is no tenderness.  Musculoskeletal: Normal range of motion. He exhibits no edema.  Lymphadenopathy:    He has no cervical adenopathy.  Neurological: He is alert. He exhibits normal muscle tone.  Skin: Skin is warm and dry. No erythema.  Psychiatric: He has a normal mood and affect. His behavior is normal. Judgment normal.          Assessment & Plan:  #1 diabetes fair control will need to be on additional medicine we will check into what is formulary covers then recommended  #2 HTN on recheck blood pressure was actually better, very important for this patient to quit smoking he understands that.  #3 hyperlipidemia continue current measures check lab work again later this year  followup in 3-4 months  I reviewed over the formulary Which his wife brought a couple days later. At first we started on long-acting insulin but he did not want to do this. So therefore we prescribe Janumet XR and he will followup as 3-4 months after this to check hemoglobin A1c  25 minutes spent with patient.

## 2013-11-07 ENCOUNTER — Telehealth: Payer: Self-pay | Admitting: Family Medicine

## 2013-11-07 MED ORDER — INSULIN DETEMIR 100 UNIT/ML FLEXPEN: SUBCUTANEOUS | Status: DC

## 2013-11-07 MED ORDER — FLUTICASONE-SALMETEROL 500-50 MCG/DOSE IN AEPB: 2.0000 | INHALATION_SPRAY | Freq: Two times a day (BID) | RESPIRATORY_TRACT | Status: DC

## 2013-11-07 NOTE — Telephone Encounter (Signed)
Med sent. Pt notified.  

## 2013-11-07 NOTE — Telephone Encounter (Signed)
Please do times 6

## 2013-11-07 NOTE — Addendum Note (Signed)
Addended byCharolotte Capuchin D on: 11/07/2013 03:32 PM   Modules accepted: Orders

## 2013-11-07 NOTE — Telephone Encounter (Signed)
Patient needs Rx for advair diskus 500/50. He is completely out.  [facility]

## 2013-11-09 ENCOUNTER — Telehealth: Payer: Self-pay | Admitting: Family Medicine

## 2013-11-09 NOTE — Telephone Encounter (Signed)
Patient needs to cancel levamir and wants Janumet XR   [facility]

## 2013-11-10 ENCOUNTER — Other Ambulatory Visit: Payer: Self-pay | Admitting: *Deleted

## 2013-11-10 MED ORDER — PRAVASTATIN SODIUM 20 MG PO TABS: 20.0000 mg | ORAL_TABLET | Freq: Every day | ORAL | Status: DC

## 2013-11-10 MED ORDER — FLUTICASONE-SALMETEROL 500-50 MCG/DOSE IN AEPB: 2.0000 | INHALATION_SPRAY | Freq: Two times a day (BID) | RESPIRATORY_TRACT | Status: DC

## 2013-11-10 MED ORDER — SITAGLIPTIN-METFORMIN HCL ER 50-500 MG PO TB24
ORAL_TABLET | ORAL | Status: DC
Start: 2013-11-10 — End: 2014-04-10

## 2013-11-10 MED ORDER — GLIPIZIDE 5 MG PO TABS: ORAL_TABLET | ORAL | Status: DC

## 2013-11-10 NOTE — Telephone Encounter (Signed)
D/c Levemir, start Janumet XR 50-500, 1 qd , #90, 1 refill, OV 3 months after starting new med.

## 2013-11-10 NOTE — Telephone Encounter (Signed)
Notified patient via VM stating we sent in the new med. 

## 2013-11-15 ENCOUNTER — Other Ambulatory Visit: Payer: Self-pay | Admitting: *Deleted

## 2013-11-15 MED ORDER — FLUTICASONE-SALMETEROL 500-50 MCG/DOSE IN AEPB: 1.0000 | INHALATION_SPRAY | Freq: Two times a day (BID) | RESPIRATORY_TRACT | Status: DC

## 2013-11-17 ENCOUNTER — Telehealth: Payer: Self-pay | Admitting: Family Medicine

## 2013-11-17 ENCOUNTER — Encounter (INDEPENDENT_AMBULATORY_CARE_PROVIDER_SITE_OTHER): Payer: Self-pay | Admitting: *Deleted

## 2013-11-17 NOTE — Telephone Encounter (Signed)
[employer] DENIED pt's JANUMET XR50/500, please see letter on paper chart, please advise

## 2013-11-18 NOTE — Telephone Encounter (Signed)
Letter of support was done

## 2013-11-24 ENCOUNTER — Telehealth: Payer: Self-pay | Admitting: Family Medicine

## 2013-11-24 NOTE — Telephone Encounter (Signed)
Rx prior auth APPROVED for pt's JANUMET XR 50/500 tablets #90/90days has been approved through [employer], expires 11/22/2015. Faxed approval to [employer]

## 2014-01-31 ENCOUNTER — Encounter: Payer: Self-pay | Admitting: Family Medicine

## 2014-01-31 ENCOUNTER — Ambulatory Visit (INDEPENDENT_AMBULATORY_CARE_PROVIDER_SITE_OTHER): Payer: Medicare PPO | Admitting: Family Medicine

## 2014-01-31 VITALS — BP 120/78 | Ht 72.0 in | Wt 249.0 lb

## 2014-01-31 DIAGNOSIS — E785 Hyperlipidemia, unspecified: Secondary | ICD-10-CM

## 2014-01-31 DIAGNOSIS — J449 Chronic obstructive pulmonary disease, unspecified: Secondary | ICD-10-CM

## 2014-01-31 DIAGNOSIS — Z720 Tobacco use: Secondary | ICD-10-CM

## 2014-01-31 DIAGNOSIS — R911 Solitary pulmonary nodule: Secondary | ICD-10-CM

## 2014-01-31 DIAGNOSIS — IMO0001 Reserved for inherently not codable concepts without codable children: Secondary | ICD-10-CM

## 2014-01-31 DIAGNOSIS — I10 Essential (primary) hypertension: Secondary | ICD-10-CM

## 2014-01-31 DIAGNOSIS — E1165 Type 2 diabetes mellitus with hyperglycemia: Secondary | ICD-10-CM

## 2014-01-31 DIAGNOSIS — IMO0002 Reserved for concepts with insufficient information to code with codable children: Secondary | ICD-10-CM

## 2014-01-31 DIAGNOSIS — J4489 Other specified chronic obstructive pulmonary disease: Secondary | ICD-10-CM

## 2014-01-31 DIAGNOSIS — F17200 Nicotine dependence, unspecified, uncomplicated: Secondary | ICD-10-CM

## 2014-01-31 DIAGNOSIS — Z79899 Other long term (current) drug therapy: Secondary | ICD-10-CM

## 2014-01-31 NOTE — Progress Notes (Signed)
   Subjective:    Patient ID: Roger Phillips, male    DOB: 12/20/1955, 58 y.o.   MRN: [id number]  Diabetes He presents for his follow-up diabetic visit. He has type 2 diabetes mellitus. (115 - 200) He does not see a podiatrist.Eye exam is current.   Patient states he does good job taken his medicine but he relates a high cost a diabetic medicine is making a very difficult autumn.  He still smokes he knows he needs to quit he relates intermittent coughing he does followup pulmonologist for pulmonary nodule but he is actually behind schedule on this and he thinks he needs to have a CAT scan.  Patient's wife was counseled that he really needs a colonoscopy she is to work hard towards him getting it. Patient states no other concerns.   Review of Systems He does relate some cough denies weight loss he does relate intermittent shortness of breath with activity. Denies chest pressure tightness or pain he does state increased urination at times   he relates compliance with cholesterol medicines Objective:   Physical Exam Neck no masses Lungs clear hearts regular Abdomen is soft no guarding or rebound extremities no edema foot exam normal pulses are normal       Assessment & Plan:  1. Hypertension Hypertension under decent control watch diet continue medications.  2. COPD (chronic obstructive pulmonary disease) Has a history pulmonary nodule pulmonologist in his note recommended no followup scan in April therefore we will order it. Patient counseled to quit smoking.  3. Type 2 diabetes mellitus, uncontrolled We will see what how his A1c is doing may have to adjust medications he wants to be on cheaper medicines but unfortunately diabetes as an expensive illness - Hemoglobin A1c  4. Pulmonary nodule CT scan ordered. This was ordered today - CT Chest Wo Contrast  5. Hyperlipidemia Continue medication watch diet closely - Lipid panel  6. Tobacco abuse Counseled strongly to quit  smoking  7. Encounter for long-term (current) use of other medications Await the results of his lab work followup 3 months - Basic metabolic panel - Hepatic function panel

## 2014-02-01 ENCOUNTER — Telehealth (INDEPENDENT_AMBULATORY_CARE_PROVIDER_SITE_OTHER): Payer: Self-pay | Admitting: *Deleted

## 2014-02-01 ENCOUNTER — Encounter (INDEPENDENT_AMBULATORY_CARE_PROVIDER_SITE_OTHER): Payer: Self-pay | Admitting: *Deleted

## 2014-02-01 ENCOUNTER — Other Ambulatory Visit (INDEPENDENT_AMBULATORY_CARE_PROVIDER_SITE_OTHER): Payer: Self-pay | Admitting: *Deleted

## 2014-02-01 DIAGNOSIS — Z1211 Encounter for screening for malignant neoplasm of colon: Secondary | ICD-10-CM

## 2014-02-01 LAB — HEPATIC FUNCTION PANEL
ALT: 20 U/L (ref 0–53)
AST: 16 U/L (ref 0–37)
Albumin: 4.2 g/dL (ref 3.5–5.2)
Alkaline Phosphatase: 63 U/L (ref 39–117)
BILIRUBIN DIRECT: 0.1 mg/dL (ref 0.0–0.3)
BILIRUBIN INDIRECT: 0.7 mg/dL (ref 0.2–1.2)
TOTAL PROTEIN: 6.6 g/dL (ref 6.0–8.3)
Total Bilirubin: 0.8 mg/dL (ref 0.2–1.2)

## 2014-02-01 LAB — LIPID PANEL
CHOL/HDL RATIO: 4.8 ratio
Cholesterol: 178 mg/dL (ref 0–200)
HDL: 37 mg/dL — ABNORMAL LOW (ref >39)
LDL CALC: 108 mg/dL — AB (ref 0–99)
Triglycerides: 167 mg/dL — ABNORMAL HIGH (ref <150)
VLDL: 33 mg/dL (ref 0–40)

## 2014-02-01 LAB — BASIC METABOLIC PANEL
BUN: 16 mg/dL (ref 6–23)
CALCIUM: 9.2 mg/dL (ref 8.4–10.5)
CO2: 29 meq/L (ref 19–32)
CREATININE: 0.88 mg/dL (ref 0.50–1.35)
Chloride: 98 mEq/L (ref 96–112)
Glucose, Bld: 149 mg/dL — ABNORMAL HIGH (ref 70–99)
Potassium: 4.3 mEq/L (ref 3.5–5.3)
Sodium: 136 mEq/L (ref 135–145)

## 2014-02-01 LAB — HEMOGLOBIN A1C
HEMOGLOBIN A1C: 6.8 % — AB (ref <5.7)
MEAN PLASMA GLUCOSE: 148 mg/dL — AB (ref <117)

## 2014-02-01 MED ORDER — PEG-KCL-NACL-NASULF-NA ASC-C 100 G PO SOLR: 1.0000 | Freq: Once | ORAL | Status: DC

## 2014-02-01 NOTE — Telephone Encounter (Signed)
Patient needs movi prep 

## 2014-02-03 MED ORDER — PRAVASTATIN SODIUM 40 MG PO TABS: 40.0000 mg | ORAL_TABLET | Freq: Every day | ORAL | Status: DC

## 2014-02-03 NOTE — Progress Notes (Signed)
Patient notified and verbalized understanding of the test results. No further questions. 

## 2014-02-03 NOTE — Addendum Note (Signed)
Addended byCharolotte Capuchin D on: 02/03/2014 10:16 AM   Modules accepted: Orders

## 2014-02-07 ENCOUNTER — Ambulatory Visit (HOSPITAL_COMMUNITY)
Admission: RE | Admit: 2014-02-07 | Discharge: 2014-02-07 | Disposition: A | Discharge status: Home / Self Care | Payer: Medicare HMO | Source: Ambulatory Visit | Attending: Family Medicine | Admitting: Family Medicine

## 2014-02-07 ENCOUNTER — Encounter (HOSPITAL_COMMUNITY): Payer: Self-pay

## 2014-02-07 DIAGNOSIS — K8020 Calculus of gallbladder without cholecystitis without obstruction: Secondary | ICD-10-CM | POA: Insufficient documentation

## 2014-02-07 DIAGNOSIS — I2584 Coronary atherosclerosis due to calcified coronary lesion: Secondary | ICD-10-CM | POA: Insufficient documentation

## 2014-02-07 DIAGNOSIS — R918 Other nonspecific abnormal finding of lung field: Secondary | ICD-10-CM | POA: Insufficient documentation

## 2014-02-09 ENCOUNTER — Other Ambulatory Visit: Payer: Self-pay | Admitting: Family Medicine

## 2014-02-10 NOTE — Telephone Encounter (Signed)
3 refills in addition to this current refill

## 2014-02-20 ENCOUNTER — Telehealth: Payer: Self-pay | Admitting: Family Medicine

## 2014-02-20 MED ORDER — FLUTICASONE-SALMETEROL 500-50 MCG/DOSE IN AEPB: 1.0000 | INHALATION_SPRAY | Freq: Two times a day (BID) | RESPIRATORY_TRACT | Status: DC

## 2014-02-20 NOTE — Telephone Encounter (Signed)
Medication sent to pharmacy. Patient was notified.  

## 2014-02-20 NOTE — Telephone Encounter (Signed)
Patient needs Rx for advair   [facility] [place] [facility]

## 2014-03-22 ENCOUNTER — Encounter (INDEPENDENT_AMBULATORY_CARE_PROVIDER_SITE_OTHER): Payer: Self-pay | Admitting: *Deleted

## 2014-03-24 ENCOUNTER — Telehealth (INDEPENDENT_AMBULATORY_CARE_PROVIDER_SITE_OTHER): Payer: Self-pay | Admitting: *Deleted

## 2014-03-24 NOTE — Telephone Encounter (Signed)
  Procedure: tcs  Reason/Indication:  screening  Has patient had this procedure before?  no  If so, when, by whom and where?    Is there a family history of colon cancer?  no  Who?  What age when diagnosed?    Is patient diabetic?   yes      Does patient have prosthetic heart valve?  no  Do you have a pacemaker?  no  Has patient ever had endocarditis? no  Has patient had joint replacement within last 12 months?  no  Does patient tend to be constipated or take laxatives? no  Is patient on Coumadin, Plavix and/or Aspirin? yes  Medications: asa 81 mg daily, omeprazole 20 mg daily, hctz 25 mg daily zyrtec 10 mg daily, glipizide 5 mg bid (am & pm), pravastatin 20 mg daily, amlodipine 5 mg daily, janumet 50/500 mg daily (am), advair inhaler 500/50 1 puff bid, ventolin inhaler 90 mcg 2 puffs every 4 hours, nebulizer prn  Allergies: sulfur drusg  Medication Adjustment: asa 2 days, hold glipizide evening before and hold janumet & glipizide morning of  Procedure date & time: 04/19/14 at 1030

## 2014-03-27 NOTE — Telephone Encounter (Signed)
agree

## 2014-03-28 ENCOUNTER — Telehealth (INDEPENDENT_AMBULATORY_CARE_PROVIDER_SITE_OTHER): Payer: Self-pay | Admitting: *Deleted

## 2014-03-28 NOTE — Telephone Encounter (Signed)
[employer] PA# for colonoscopy [id number]

## 2014-04-10 ENCOUNTER — Encounter (HOSPITAL_COMMUNITY): Payer: Self-pay | Admitting: Pharmacy Technician

## 2014-04-19 ENCOUNTER — Encounter (HOSPITAL_COMMUNITY): Payer: Self-pay

## 2014-04-19 ENCOUNTER — Ambulatory Visit (HOSPITAL_COMMUNITY)
Admission: RE | Admit: 2014-04-19 | Discharge: 2014-04-19 | Disposition: A | Discharge status: Home / Self Care | Payer: Medicare HMO | Source: Ambulatory Visit | Attending: Internal Medicine | Admitting: Internal Medicine

## 2014-04-19 ENCOUNTER — Encounter (HOSPITAL_COMMUNITY)
Admission: RE | Disposition: A | Discharge status: Home / Self Care | Payer: Self-pay | Source: Ambulatory Visit | Attending: Internal Medicine

## 2014-04-19 DIAGNOSIS — Z79899 Other long term (current) drug therapy: Secondary | ICD-10-CM | POA: Insufficient documentation

## 2014-04-19 DIAGNOSIS — J4489 Other specified chronic obstructive pulmonary disease: Secondary | ICD-10-CM | POA: Insufficient documentation

## 2014-04-19 DIAGNOSIS — K5730 Diverticulosis of large intestine without perforation or abscess without bleeding: Secondary | ICD-10-CM | POA: Insufficient documentation

## 2014-04-19 DIAGNOSIS — E119 Type 2 diabetes mellitus without complications: Secondary | ICD-10-CM | POA: Insufficient documentation

## 2014-04-19 DIAGNOSIS — F3289 Other specified depressive episodes: Secondary | ICD-10-CM | POA: Insufficient documentation

## 2014-04-19 DIAGNOSIS — I10 Essential (primary) hypertension: Secondary | ICD-10-CM | POA: Insufficient documentation

## 2014-04-19 DIAGNOSIS — F329 Major depressive disorder, single episode, unspecified: Secondary | ICD-10-CM | POA: Insufficient documentation

## 2014-04-19 DIAGNOSIS — F17200 Nicotine dependence, unspecified, uncomplicated: Secondary | ICD-10-CM | POA: Insufficient documentation

## 2014-04-19 DIAGNOSIS — Z7982 Long term (current) use of aspirin: Secondary | ICD-10-CM | POA: Insufficient documentation

## 2014-04-19 DIAGNOSIS — K219 Gastro-esophageal reflux disease without esophagitis: Secondary | ICD-10-CM | POA: Insufficient documentation

## 2014-04-19 DIAGNOSIS — E7800 Pure hypercholesterolemia, unspecified: Secondary | ICD-10-CM | POA: Insufficient documentation

## 2014-04-19 DIAGNOSIS — Z1211 Encounter for screening for malignant neoplasm of colon: Secondary | ICD-10-CM

## 2014-04-19 DIAGNOSIS — J449 Chronic obstructive pulmonary disease, unspecified: Secondary | ICD-10-CM | POA: Insufficient documentation

## 2014-04-19 DIAGNOSIS — D126 Benign neoplasm of colon, unspecified: Secondary | ICD-10-CM

## 2014-04-19 HISTORY — PX: COLONOSCOPY: SHX5424

## 2014-04-19 LAB — GLUCOSE, CAPILLARY: Glucose-Capillary: 158 mg/dL — ABNORMAL HIGH (ref 70–99)

## 2014-04-19 SURGERY — COLONOSCOPY
Anesthesia: Moderate Sedation

## 2014-04-19 MED ORDER — MIDAZOLAM HCL 5 MG/5ML IJ SOLN
INTRAMUSCULAR | Status: AC
  Filled 2014-04-19: qty 10

## 2014-04-19 MED ORDER — MEPERIDINE HCL 50 MG/ML IJ SOLN
INTRAMUSCULAR | Status: AC
  Filled 2014-04-19: qty 1

## 2014-04-19 MED ORDER — SODIUM CHLORIDE 0.9 % IV SOLN
INTRAVENOUS | Status: DC
  Administered 2014-04-19: 11:00:00 via INTRAVENOUS

## 2014-04-19 MED ORDER — MEPERIDINE HCL 50 MG/ML IJ SOLN
INTRAMUSCULAR | Status: DC | PRN
  Administered 2014-04-19 (×2): 25 mg via INTRAVENOUS

## 2014-04-19 MED ORDER — MIDAZOLAM HCL 5 MG/5ML IJ SOLN
INTRAMUSCULAR | Status: DC | PRN
  Administered 2014-04-19 (×3): 2 mg via INTRAVENOUS

## 2014-04-19 NOTE — H&P (Signed)
Roger Phillips is an 58 y.o. male.   Chief Complaint: Patient is here for colonoscopy. HPI: Patient is 58 year old Caucasian male who is in for screening colonoscopy. This is patient's first exam. He denies abdominal pain change in his bowel habits or frank rectal bleeding. He has hemorrhoids with intermittent hematochezia fusion form of blood in the tissue. Family history is negative for CRC.  Past Medical History  Diagnosis Date  . High cholesterol     takes Pravastatin daily  . COPD (chronic obstructive pulmonary disease)   . HTN (hypertension)     takes Lisinopril,Norvasc,and HCTZ daily  . Asthma   . Emphysema   . Pneumonia     hx of in 80's and 90's  . Sinus headache     sinus drainage  . Arthritis     neck and left shoulder  . Back pain     buldging disc  . GERD (gastroesophageal reflux disease)     takes Omeprazole daily  . Hemorrhoids   . IBS (irritable bowel syndrome)   . Depression     takes Wellbutrin  . Insomnia     doesn't take any meds   . Diabetes mellitus     takes Metformin bid    Past Surgical History  Procedure Laterality Date  . Carpal tunnel release  10+yrs    right side  . Cystectomy  12-43yr ago    from left side of neck  . Anterior cervical decomp/discectomy fusion  12/04/2011    Procedure: ANTERIOR CERVICAL DECOMPRESSION/DISCECTOMY FUSION 2 LEVELS;  Surgeon: RHosie Spangle MD;  [facility];  Service: Neurosurgery;  Laterality: N/A;  Cervical Three-Four, Cervical Four-Five anterior cervial decompression with fusion plating and bonegraft    Family History  Problem Relation Age of Onset  . Heart disease Father   . Hypertension Father   . Heart disease Brother   . Anesthesia problems Neg Hx   . Hypotension Neg Hx   . Malignant hyperthermia Neg Hx   . Pseudochol deficiency Neg Hx    Social History:  reports that he has been smoking Cigarettes.  He has a 60 pack-year smoking history. He does not have any smokeless tobacco  history on file. He reports that he drinks alcohol. He reports that he does not use illicit drugs.  Allergies:  Allergies  Allergen Reactions  . Sulfa Antibiotics Rash    Medications Prior to Admission  Medication Sig Dispense Refill  . ACCU-CHEK AVIVA PLUS test strip       . ACCU-CHEK SOFTCLIX LANCETS lancets       . albuterol (PROVENTIL) (2.5 MG/3ML) 0.083% nebulizer solution Take 2.5 mg by nebulization every 6 (six) hours as needed for wheezing or shortness of breath.      . Alcohol Swabs (B-D SINGLE USE SWABS REGULAR) PADS       . amLODipine (NORVASC) 5 MG tablet TAKE ONE (1) TABLET EACH DAY  90 tablet  1  . aspirin 81 MG tablet Take 81 mg by mouth daily.      . Blood Glucose Calibration (ACCU-CHEK AVIVA) SOLN       . Blood Glucose Monitoring Suppl (ACCU-CHEK AVIVA PLUS) W/DEVICE KIT       . cetirizine (ZYRTEC) 10 MG tablet Take 10 mg by mouth daily.      . diphenoxylate-atropine (LOMOTIL) 2.5-0.025 MG per tablet TAKE ONE TABLET BY MOUTH ONCE DAILY  90 tablet  3  . Fluticasone-Salmeterol (ADVAIR) 500-50 MCG/DOSE AEPB Inhale 1 puff into  the lungs 2 (two) times daily.  60 each  1  . glipiZIDE (GLUCOTROL) 5 MG tablet Take 5 mg by mouth 2 (two) times daily before a meal.      . hydrochlorothiazide (HYDRODIURIL) 25 MG tablet TAKE ONE (1) TABLET EACH DAY  90 tablet  1  . omeprazole (PRILOSEC) 20 MG capsule Take 20 mg by mouth daily.      . peg 3350 powder (MOVIPREP) 100 G SOLR Take 1 kit (200 g total) by mouth once.  1 kit  0  . pravastatin (PRAVACHOL) 40 MG tablet Take 1 tablet (40 mg total) by mouth daily.  90 tablet  1  . VENTOLIN HFA 108 (90 BASE) MCG/ACT inhaler INHALE 2 PUFFS EVERY 4 HOURS AS NEEDED. SHAKE WEL BEFORE EACH USE.  18 g  5  . VIAGRA 100 MG tablet TAKE ONE TABLET AS NEEDED.  10 tablet  2    Results for orders placed during the hospital encounter of 04/19/14 (from the past 48 hour(s))  GLUCOSE, CAPILLARY     Status: Abnormal   Collection Time    04/19/14 11:25 AM       Result Value Ref Range   Glucose-Capillary 158 (*) 70 - 99 mg/dL   No results found.  ROS  Blood pressure 159/91, pulse 68, temperature 98 F (36.7 C), temperature source Oral, resp. rate 18, height 6' (1.829 m), weight 235 lb (106.595 kg), SpO2 98.00%. Physical Exam  Constitutional: He appears well-developed and well-nourished.  HENT:  Mouth/Throat: Oropharynx is clear and moist.  He has 1 tooth in lower jaw.  Eyes: Conjunctivae are normal. No scleral icterus.  Neck: No thyromegaly present.  Cardiovascular: Normal rate, regular rhythm and normal heart sounds.   No murmur heard. Respiratory: Effort normal and breath sounds normal.  GI: Soft. He exhibits no distension and no mass. There is no tenderness.  Musculoskeletal: He exhibits no edema.  Lymphadenopathy:    He has no cervical adenopathy.  Neurological: He is alert.  Skin: Skin is warm and dry.     Assessment/Plan Average risk screening colonoscopy.  REHMAN,NAJEEB U 04/19/2014, 11:41 AM

## 2014-04-19 NOTE — Op Note (Signed)
COLONOSCOPY PROCEDURE REPORT  PATIENT:  Roger Phillips  MR#:  [id number] Birthdate:  Apr 04, 1956, 58 y.o., male Endoscopist:  Dr. Rogene Houston, MD Referred By:  Dr. Sallee Lange, MD Procedure Date: 04/19/2014  Procedure:   Colonoscopy with snare polypectomy.  Indications: Patient is 59 year old Caucasian male who is undergoing average risk screening colonoscopy.  Informed Consent:  The procedure and risks were reviewed with the patient and informed consent was obtained.  Medications:  Demerol 50 mg IV Versed 6 mg IV  Description of procedure:  After a digital rectal exam was performed, that colonoscope was advanced from the anus through the rectum and colon to the area of the cecum, ileocecal valve and appendiceal orifice. The cecum was deeply intubated. These structures were well-seen and photographed for the record. From the level of the cecum and ileocecal valve, the scope was slowly and cautiously withdrawn. The mucosal surfaces were carefully surveyed utilizing scope tip to flexion to facilitate fold flattening as needed. The scope was pulled down into the rectum where a thorough exam including retroflexion was performed.  Findings:   Prep satisfactory. Small polyps were coagulated using snare tip. These are located at ascending colon and hepatic flexure. 7 mm polyp snared from the hepatic flexure and submitted in other similar sized polyp snared from proximal transverse colon. Three polyps measuring 7-8 mm snared from descending colon and submitted together. 12 mm polyp snared from sigmoid colon. Single inverted diverticulum with edema to mucosa. Normal rectal mucosa and anal rectal junction.   Therapeutic/Diagnostic Maneuvers Performed:  See above  Complications:  None  Cecal Withdrawal Time:  29 minutes  Impression:  Examination performed to cecum. Patient had 8 polyps altogether. Two small polyps were coagulated using snare tip(ascending colon and hepatic  flexure). Two polyps were hot snared and submitted together(hepatic flexure and optimal transverse colon) these are about 7 mm each. Three polyps hot snare from descending colon and submitted together. These are 6-8 mm in diameter. 12 mm polyp hot snared from sigmoid colon. Single small doublets of limited sigmoid colon.  Recommendations:  Standard instructions given. No aspirin or NSAIDs for 1 week. I will contact patient with biopsy results and further recommendations.  Leary Mcnulty U  04/19/2014 12:34 PM  CC: Dr. Sallee Lange, MD & Dr. Rayne Du ref. provider found

## 2014-04-19 NOTE — Discharge Instructions (Signed)
No aspirin or NSAIDs for 1 week ?Resume other medications and diet as before ?No driving for 24 hours ?Physician will call with biopsy results. ?

## 2014-04-21 ENCOUNTER — Encounter (HOSPITAL_COMMUNITY): Payer: Self-pay | Admitting: Internal Medicine

## 2014-04-28 ENCOUNTER — Encounter: Payer: Self-pay | Admitting: Family Medicine

## 2014-04-28 ENCOUNTER — Ambulatory Visit (INDEPENDENT_AMBULATORY_CARE_PROVIDER_SITE_OTHER): Payer: Medicare PPO | Admitting: Family Medicine

## 2014-04-28 VITALS — BP 132/86 | Resp 20 | Ht 72.0 in | Wt 249.0 lb

## 2014-04-28 DIAGNOSIS — Z79899 Other long term (current) drug therapy: Secondary | ICD-10-CM

## 2014-04-28 DIAGNOSIS — IMO0002 Reserved for concepts with insufficient information to code with codable children: Secondary | ICD-10-CM

## 2014-04-28 DIAGNOSIS — E669 Obesity, unspecified: Secondary | ICD-10-CM | POA: Insufficient documentation

## 2014-04-28 DIAGNOSIS — IMO0001 Reserved for inherently not codable concepts without codable children: Secondary | ICD-10-CM

## 2014-04-28 DIAGNOSIS — M545 Low back pain, unspecified: Secondary | ICD-10-CM

## 2014-04-28 DIAGNOSIS — I10 Essential (primary) hypertension: Secondary | ICD-10-CM

## 2014-04-28 DIAGNOSIS — E785 Hyperlipidemia, unspecified: Secondary | ICD-10-CM

## 2014-04-28 DIAGNOSIS — E1165 Type 2 diabetes mellitus with hyperglycemia: Secondary | ICD-10-CM

## 2014-04-28 MED ORDER — SILDENAFIL CITRATE 20 MG PO TABS: ORAL_TABLET | ORAL | Status: DC

## 2014-04-28 NOTE — Progress Notes (Signed)
   Subjective:    Patient ID: Roger Phillips, male    DOB: 10/22/1955, 58 y.o.   MRN: [id number]  HPI  Patient here is for 3 month follow-up. And would like results of Colonoscopy. Long discussion held regarding diabetes high cost medication and the importance of adhering to his medicine. See below.  Long discussion held regarding COPD the importance of quitting smoking healthy habits he has a hard time affording inhalers unfortunately no other chief alternatives  Patient also with some swelling of the right calf that occurs when he is up for long periods of time he gets a little bit puffy. That concern the patient's. He denies calf pain.  He also wanted generic prescription of Viagra given. Review of Systems  Constitutional: Negative for activity change, appetite change and fatigue.  HENT: Negative for congestion.   Respiratory: Negative for cough.   Cardiovascular: Negative for chest pain.  Gastrointestinal: Negative for abdominal pain.  Endocrine: Negative for polydipsia and polyphagia.  Neurological: Negative for weakness.  Psychiatric/Behavioral: Negative for confusion.       Objective:   Physical Exam  Vitals reviewed. Constitutional: He appears well-nourished. No distress.  Cardiovascular: Normal rate, regular rhythm and normal heart sounds.   No murmur heard. Pulmonary/Chest: Effort normal and breath sounds normal. No respiratory distress.  Musculoskeletal: He exhibits no edema.  Lymphadenopathy:    He has no cervical adenopathy.  Neurological: He is alert.  Psychiatric: His behavior is normal.          Assessment & Plan:  #1 diabetes-he has a hard time affording medication. He will do his best at maintaining a healthy diet and taking his medicines. He has not been taking Januvia. He may well have to be tried on metformin again. Patient has tried at one time before and had nausea/diarrhea with it. He will check his lab work.  #2 obesity he is to try to watch  his portions try to lose weight become more physically active  #3 tobacco abuse he was counseled strongly to quit  #4 HTN decent control continue current measures  #5 history hyperlipidemia check lipid profile  #6 mild venous insufficiency in the right leg knee-high surgical support hose they're going to buy these over-the-counter.  #7 COPD use medication quit smoking

## 2014-05-02 ENCOUNTER — Encounter: Payer: Self-pay | Admitting: Family Medicine

## 2014-05-02 NOTE — Telephone Encounter (Signed)
Dr.Scott's patient. My Chart Message from wife:  Roger Phillips and [facility] to [facility] to pick up his advair. The cost was 180.00 we could not get it. he did buy the nicoderm patches to quit smoking but needs his advair also. they said next month it will cost 230.00. i talked to the people at [facility] and they said it keeps going up by the way the prescription is written. they have it at 2 puffs twice a day and it should be one puff twice a day. could you send in a new prescription so we can see if that is more affordable and lower the dose back down to 250. if you know of any way we can get help with this medication please let me know. we are on a tight budget . and he needs this medication thank you, [name]

## 2014-05-03 ENCOUNTER — Other Ambulatory Visit: Payer: Self-pay | Admitting: *Deleted

## 2014-05-03 MED ORDER — FLUTICASONE-SALMETEROL 250-50 MCG/DOSE IN AEPB: 1.0000 | INHALATION_SPRAY | Freq: Two times a day (BID) | RESPIRATORY_TRACT | Status: DC

## 2014-05-03 NOTE — Telephone Encounter (Signed)
Advair 250 one puff twice a day, is what they would like

## 2014-05-09 ENCOUNTER — Other Ambulatory Visit: Payer: Self-pay | Admitting: Family Medicine

## 2014-05-09 ENCOUNTER — Other Ambulatory Visit: Payer: Self-pay | Admitting: *Deleted

## 2014-05-09 MED ORDER — OMEPRAZOLE 20 MG PO CPDR: 20.0000 mg | DELAYED_RELEASE_CAPSULE | Freq: Every day | ORAL | Status: DC

## 2014-05-09 MED ORDER — AMLODIPINE BESYLATE 5 MG PO TABS: ORAL_TABLET | ORAL | Status: DC

## 2014-05-09 MED ORDER — PRAVASTATIN SODIUM 40 MG PO TABS: 40.0000 mg | ORAL_TABLET | Freq: Every day | ORAL | Status: DC

## 2014-05-10 NOTE — Telephone Encounter (Signed)
May have 4 refills 

## 2014-05-31 ENCOUNTER — Other Ambulatory Visit: Payer: Self-pay | Admitting: *Deleted

## 2014-05-31 MED ORDER — HYDROCHLOROTHIAZIDE 25 MG PO TABS: ORAL_TABLET | ORAL | Status: DC

## 2014-05-31 MED ORDER — GLIPIZIDE 5 MG PO TABS: 5.0000 mg | ORAL_TABLET | Freq: Two times a day (BID) | ORAL | Status: DC

## 2014-06-05 ENCOUNTER — Other Ambulatory Visit: Payer: Self-pay | Admitting: Family Medicine

## 2014-06-06 ENCOUNTER — Other Ambulatory Visit: Payer: Self-pay | Admitting: *Deleted

## 2014-06-06 MED ORDER — FLUTICASONE-SALMETEROL 250-50 MCG/DOSE IN AEPB: INHALATION_SPRAY | RESPIRATORY_TRACT | Status: DC

## 2014-06-15 LAB — HEPATIC FUNCTION PANEL
ALT: 23 U/L (ref 0–53)
AST: 16 U/L (ref 0–37)
Albumin: 4.2 g/dL (ref 3.5–5.2)
Alkaline Phosphatase: 57 U/L (ref 39–117)
BILIRUBIN DIRECT: 0.1 mg/dL (ref 0.0–0.3)
BILIRUBIN INDIRECT: 0.7 mg/dL (ref 0.2–1.2)
Total Bilirubin: 0.8 mg/dL (ref 0.2–1.2)
Total Protein: 6.9 g/dL (ref 6.0–8.3)

## 2014-06-15 LAB — HEMOGLOBIN A1C
HEMOGLOBIN A1C: 8.8 % — AB (ref <5.7)
Mean Plasma Glucose: 206 mg/dL — ABNORMAL HIGH (ref <117)

## 2014-06-15 LAB — LIPID PANEL
CHOL/HDL RATIO: 4.9 ratio
CHOLESTEROL: 156 mg/dL (ref 0–200)
HDL: 32 mg/dL — ABNORMAL LOW (ref >39)
LDL Cholesterol: 78 mg/dL (ref 0–99)
Triglycerides: 232 mg/dL — ABNORMAL HIGH (ref <150)
VLDL: 46 mg/dL — ABNORMAL HIGH (ref 0–40)

## 2014-06-16 LAB — MICROALBUMIN, URINE: Microalb, Ur: 2.84 mg/dL — ABNORMAL HIGH (ref 0.00–1.89)

## 2014-06-20 ENCOUNTER — Other Ambulatory Visit: Payer: Self-pay | Admitting: *Deleted

## 2014-06-20 MED ORDER — GLIPIZIDE 5 MG PO TABS: 10.0000 mg | ORAL_TABLET | Freq: Two times a day (BID) | ORAL | Status: DC

## 2014-07-03 ENCOUNTER — Other Ambulatory Visit: Payer: Self-pay | Admitting: Family Medicine

## 2014-07-04 ENCOUNTER — Ambulatory Visit (INDEPENDENT_AMBULATORY_CARE_PROVIDER_SITE_OTHER): Payer: Commercial Managed Care - HMO

## 2014-07-04 DIAGNOSIS — Z23 Encounter for immunization: Secondary | ICD-10-CM

## 2014-07-05 ENCOUNTER — Telehealth: Payer: Self-pay

## 2014-07-05 NOTE — Telephone Encounter (Signed)
Please review glucose readings in folder.

## 2014-07-07 NOTE — Telephone Encounter (Signed)
The patient's glucoses clearly showed that he is at a point where the next step would be adding a long acting insulin. I would be happy to see the patient in to discuss this with him face-to-face. He can certainly bring his wife with him as well.(Should be noted that his lab work over the past few weeks that he has done at home as shown a fasting sugar is anywhere between 176 and 211. Pre-meal glucoses ranging anywhere from 240-250 and postmeal glucoses are often in the low 200s to mid 200s)

## 2014-07-07 NOTE — Telephone Encounter (Signed)
Notified patient glucoses clearly showed that he is at a point where the next step would be adding a long acting insulin. Dr. Nicki Reaper would be happy to see the patient in to discuss this with him face-to-face. He can certainly bring his wife with him as well. Patient was transferred to front desk to schedule appointment.

## 2014-07-11 ENCOUNTER — Ambulatory Visit (INDEPENDENT_AMBULATORY_CARE_PROVIDER_SITE_OTHER): Payer: Commercial Managed Care - HMO | Admitting: Family Medicine

## 2014-07-11 ENCOUNTER — Encounter: Payer: Self-pay | Admitting: Family Medicine

## 2014-07-11 VITALS — BP 120/82 | Ht 72.0 in | Wt 244.0 lb

## 2014-07-11 DIAGNOSIS — IMO0002 Reserved for concepts with insufficient information to code with codable children: Secondary | ICD-10-CM

## 2014-07-11 DIAGNOSIS — E1165 Type 2 diabetes mellitus with hyperglycemia: Secondary | ICD-10-CM

## 2014-07-11 MED ORDER — INSULIN GLARGINE 100 UNIT/ML SOLOSTAR PEN: PEN_INJECTOR | SUBCUTANEOUS | Status: DC

## 2014-07-11 MED ORDER — LISINOPRIL 2.5 MG PO TABS: 2.5000 mg | ORAL_TABLET | Freq: Every day | ORAL | Status: DC

## 2014-07-11 NOTE — Progress Notes (Signed)
   Subjective:    Patient ID: Roger Phillips, male    DOB: Dec 20, 1955, 58 y.o.   MRN: [id number]  Diabetes He presents for his follow-up diabetic visit. He has type 2 diabetes mellitus. Current diabetic treatment includes oral agent (monotherapy). He is compliant with treatment all of the time. He is following a generally unhealthy diet. He never participates in exercise. His breakfast blood glucose range is generally 140-180 mg/dl. His dinner blood glucose range is generally 180-200 mg/dl. He does not see a podiatrist.Eye exam is not current.  A1C on 06/15/14 was 8.8.  Glucose readings significantly elevated.  Review of Systems     Objective:   Physical Exam  Deferred-this visit was spent in direct consultation greater than half was spent discussing the issue      Assessment & Plan:  Diabetes-20 minutes with this patient discussing his diabetes discussing the need to be on insulin with treatment parameters and elevated the cost of diabetes.  Patient will work on quitting smoking His wife will send a regular readings so we can adjust his long-acting insulin Start lantus 8 units each evening Patient would benefit from diabetic counseling

## 2014-07-11 NOTE — Patient Instructions (Signed)
Diabetes Mellitus and Food It is important for you to manage your blood sugar (glucose) level. Your blood glucose level can be greatly affected by what you eat. Eating healthier foods in the appropriate amounts throughout the day at about the same time each day will help you control your blood glucose level. It can also help slow or prevent worsening of your diabetes mellitus. Healthy eating may even help you improve the level of your blood pressure and reach or maintain a healthy weight.  HOW CAN FOOD AFFECT ME? Carbohydrates Carbohydrates affect your blood glucose level more than any other type of food. Your dietitian will help you determine how many carbohydrates to eat at each meal and teach you how to count carbohydrates. Counting carbohydrates is important to keep your blood glucose at a healthy level, especially if you are using insulin or taking certain medicines for diabetes mellitus. Alcohol Alcohol can cause sudden decreases in blood glucose (hypoglycemia), especially if you use insulin or take certain medicines for diabetes mellitus. Hypoglycemia can be a life-threatening condition. Symptoms of hypoglycemia (sleepiness, dizziness, and disorientation) are similar to symptoms of having too much alcohol.  If your health care provider has given you approval to drink alcohol, do so in moderation and use the following guidelines:  Women should not have more than one drink per day, and men should not have more than two drinks per day. One drink is equal to:  12 oz of beer.  5 oz of wine.  1 oz of hard liquor.  Do not drink on an empty stomach.  Keep yourself hydrated. Have water, diet soda, or unsweetened iced tea.  Regular soda, juice, and other mixers might contain a lot of carbohydrates and should be counted. WHAT FOODS ARE NOT RECOMMENDED? As you make food choices, it is important to remember that all foods are not the same. Some foods have fewer nutrients per serving than other  foods, even though they might have the same number of calories or carbohydrates. It is difficult to get your body what it needs when you eat foods with fewer nutrients. Examples of foods that you should avoid that are high in calories and carbohydrates but low in nutrients include:  Trans fats (most processed foods list trans fats on the Nutrition Facts label).  Regular soda.  Juice.  Candy.  Sweets, such as cake, pie, doughnuts, and cookies.  Fried foods. WHAT FOODS CAN I EAT? Have nutrient-rich foods, which will nourish your body and keep you healthy. The food you should eat also will depend on several factors, including:  The calories you need.  The medicines you take.  Your weight.  Your blood glucose level.  Your blood pressure level.  Your cholesterol level. You also should eat a variety of foods, including:  Protein, such as meat, poultry, fish, tofu, nuts, and seeds (lean animal proteins are best).  Fruits.  Vegetables.  Dairy products, such as milk, cheese, and yogurt (low fat is best).  Breads, grains, pasta, cereal, rice, and beans.  Fats such as olive oil, trans fat-free margarine, canola oil, avocado, and olives. DOES EVERYONE WITH DIABETES MELLITUS HAVE THE SAME MEAL PLAN? Because every person with diabetes mellitus is different, there is not one meal plan that works for everyone. It is very important that you meet with a dietitian who will help you create a meal plan that is just right for you. Document Released: 06/19/2005 Document Revised: 09/27/2013 Document Reviewed: 08/19/2013 [employer] Patient Information 2015 [employer]. This   information is not intended to replace advice given to you by your health care provider. Make sure you discuss any questions you have with your health care provider.  

## 2014-08-01 ENCOUNTER — Telehealth: Payer: Self-pay | Admitting: Family Medicine

## 2014-08-01 NOTE — Telephone Encounter (Signed)
I reviewed over the glucose readings that the wife brought in regarding her husband Roger Phillips we discussed how to adjust his insulin she will move from 8 units at night to 10 units. She will adjust accordingly every several days and will notify us if any significant changes he will keep all follow-up visits.

## 2014-08-07 ENCOUNTER — Encounter: Payer: Self-pay | Admitting: Nutrition

## 2014-08-07 ENCOUNTER — Encounter: Payer: Commercial Managed Care - HMO | Attending: Family Medicine | Admitting: Nutrition

## 2014-08-07 VITALS — Ht 72.0 in | Wt 247.0 lb

## 2014-08-07 DIAGNOSIS — E118 Type 2 diabetes mellitus with unspecified complications: Secondary | ICD-10-CM | POA: Insufficient documentation

## 2014-08-07 DIAGNOSIS — IMO0002 Reserved for concepts with insufficient information to code with codable children: Secondary | ICD-10-CM

## 2014-08-07 DIAGNOSIS — Z713 Dietary counseling and surveillance: Secondary | ICD-10-CM | POA: Diagnosis not present

## 2014-08-07 DIAGNOSIS — E1165 Type 2 diabetes mellitus with hyperglycemia: Secondary | ICD-10-CM

## 2014-08-07 NOTE — Progress Notes (Signed)
  Medical Nutrition Therapy:  Appt start time: 2761 end time:  1630.   Assessment:  Primary concerns today: Diabetes. Here with his wife. Wife does the shopping and cooking. Testing blood sugars twice in am and in evening: 152 mg/dl this am. Most recent A1c was 8.8% from Dr. Malachy Moan office. No physical activity currently. Says he is willing to make some changes to improve his blood sugars and feel better. Typically eats 2-3 meals per day. Doesn't drink any water and only diet mt.dew. Craves sweets.  Preferred Learning Style:   Auditory  Hands on  No preference indicated   Learning Readiness:  Ready  Change in progress  MEDICATIONS: See list   DIETARY INTAKE:  24-hr recall:  B ( AM):  Egg, gravy and tenderloin biscuit, Diet Mt Dew  Snk ( AM): none  L ( PM): Skips lunch or eats a 'brunch' meal. Snk ( PM): Candy; Diet MT Dew D ( PM): Hamburger on bun and deer meat, potato tots, Dt. Dew  Snk ( PM): Chips and pb crackers and candy, Beverages: Dt. Mtn Dew,  Usual physical activity: None  Estimated energy needs: 1800 calories 200 g carbohydrates 135 g protein 50 g fat  Progress Towards Goal(s):  In progress.   Nutritional Diagnosis:  NB-1.1 Food and nutrition-related knowledge deficit As related to Diabetes.  As evidenced by A1C 8.8%.    Intervention:  Nutrition counseling and diabetes education..  Plan:  Aim for 2-3 Carb Choices per meal (30-45 grams) +/- 1 either way  Avoid snacks between meals Include protein in moderation with your meals  Consider reading food labels for Total Carbohydrate and Fat Grams of foods Consider  increasing your activity level by 15 for 30 minutes daily as tolerated Check blood sugars 4 times per day and record on sheet. Be sure to take 10 units of Lantus daily as prescribed. Goal: Drink 3 bottles of water per day. 2. Cut out snacks and sweets 3. Lose 1 lb per week. 4. Get A1C down to 7% in three months.  Teaching Method Utilized:   Visual Auditory Hands on  Handouts given during visit include: The Plate Method Carb Counting and Food Label handouts Meal Plan Card  Barriers to learning/adherence to lifestyle change: none  Demonstrated degree of understanding via:  Teach Back   Monitoring/Evaluation:  Dietary intake, exercise, meal planning, SBG, and body weight in 1 week(s).

## 2014-08-07 NOTE — Patient Instructions (Signed)
Plan:  Aim for 2-3 Carb Choices per meal (30-45 grams) +/- 1 either way  Avoid snacks between meals Include protein in moderation with your meals  Consider reading food labels for Total Carbohydrate and Fat Grams of foods Consider  increasing your activity level by 15 for 30 minutes daily as tolerated Check blood sugars 4 times per day and record on sheet. Be sure to take 10 units of Lantus daily as prescribed. Goal: Drink 3 bottles of water per day. 2. Cut out snacks and sweets 3. Lose 1 lb per week. 4. Get A1C down to 7% in three months.

## 2014-08-14 ENCOUNTER — Encounter: Payer: Commercial Managed Care - HMO | Admitting: Nutrition

## 2014-08-23 ENCOUNTER — Ambulatory Visit: Payer: Medicare PPO | Admitting: Family Medicine

## 2014-10-12 ENCOUNTER — Ambulatory Visit: Payer: Commercial Managed Care - HMO | Admitting: Family Medicine

## 2014-10-13 ENCOUNTER — Ambulatory Visit: Payer: Commercial Managed Care - HMO | Admitting: Family Medicine

## 2014-10-19 ENCOUNTER — Ambulatory Visit (INDEPENDENT_AMBULATORY_CARE_PROVIDER_SITE_OTHER): Payer: PRIVATE HEALTH INSURANCE | Admitting: Family Medicine

## 2014-10-19 ENCOUNTER — Encounter: Payer: Self-pay | Admitting: Family Medicine

## 2014-10-19 VITALS — BP 132/78 | Ht 72.0 in | Wt 243.0 lb

## 2014-10-19 DIAGNOSIS — E119 Type 2 diabetes mellitus without complications: Secondary | ICD-10-CM

## 2014-10-19 DIAGNOSIS — E1165 Type 2 diabetes mellitus with hyperglycemia: Secondary | ICD-10-CM

## 2014-10-19 DIAGNOSIS — E785 Hyperlipidemia, unspecified: Secondary | ICD-10-CM

## 2014-10-19 DIAGNOSIS — IMO0002 Reserved for concepts with insufficient information to code with codable children: Secondary | ICD-10-CM

## 2014-10-19 DIAGNOSIS — I10 Essential (primary) hypertension: Secondary | ICD-10-CM

## 2014-10-19 LAB — POCT GLYCOSYLATED HEMOGLOBIN (HGB A1C): Hemoglobin A1C: 7.7

## 2014-10-19 MED ORDER — METFORMIN HCL 500 MG PO TABS: ORAL_TABLET | ORAL | Status: DC

## 2014-10-19 MED ORDER — LOSARTAN POTASSIUM 50 MG PO TABS: 50.0000 mg | ORAL_TABLET | Freq: Every day | ORAL | Status: DC

## 2014-10-19 NOTE — Progress Notes (Signed)
   Subjective:    Patient ID: Roger Phillips, male    DOB: 02/10/1956, 59 y.o.   MRN: [id number]  Diabetes He presents for his follow-up diabetic visit. He has type 2 diabetes mellitus. There are no hypoglycemic associated symptoms. Pertinent negatives for hypoglycemia include no confusion. There are no diabetic associated symptoms. Pertinent negatives for diabetes include no chest pain, no fatigue, no polydipsia, no polyphagia and no weakness. Current diabetic treatment includes oral agent (monotherapy). He is compliant with treatment some of the time. Blood glucose monitoring compliance is poor. (Today it was 150. He checks it about once a month) He does not see a podiatrist.Eye exam is not current (Appt on Feb 5th).   Patient states they has not been taking insulin. He does not follow her sugars. He states he's not woman take any other medicine less its an expensive. He denies any chest tightness pressure pain shortness of breath.   Review of Systems  Constitutional: Negative for activity change, appetite change and fatigue.  HENT: Negative for congestion.   Respiratory: Negative for cough.   Cardiovascular: Negative for chest pain.  Gastrointestinal: Negative for abdominal pain.  Endocrine: Negative for polydipsia and polyphagia.  Neurological: Negative for weakness.  Psychiatric/Behavioral: Negative for confusion.       Objective:   Physical Exam Neck no masses lungs clear no crackles heart is regular pulses normal blood pressure is good extremities trace edema foot exam normal.       Assessment & Plan:  1. Type 2 diabetes mellitus without complication M8U actually looks better I talked with him at length about diet we will go ahead and re-add metformin half tablet twice daily hopefully he can tolerate this medicine. Last time he took it caused him stomach discomfort unfortunately he refuses other medicines because of the higher cost - POCT glycosylated hemoglobin (Hb A1C)  2.  Essential hypertension Blood pressure under decent control  3. Hyperlipidemia Continue taking medication watch diet closely  4. Type 2 diabetes mellitus, uncontrolled See above has micro-proteinuria

## 2014-10-26 ENCOUNTER — Other Ambulatory Visit: Payer: Self-pay | Admitting: *Deleted

## 2014-10-26 MED ORDER — FLUTICASONE-SALMETEROL 500-50 MCG/DOSE IN AEPB: 1.0000 | INHALATION_SPRAY | Freq: Two times a day (BID) | RESPIRATORY_TRACT | Status: DC

## 2014-11-06 LAB — HM DIABETES EYE EXAM

## 2014-11-13 ENCOUNTER — Other Ambulatory Visit: Payer: Self-pay | Admitting: Family Medicine

## 2014-11-14 ENCOUNTER — Other Ambulatory Visit: Payer: Self-pay | Admitting: *Deleted

## 2014-11-14 MED ORDER — DIPHENOXYLATE-ATROPINE 2.5-0.025 MG PO TABS: ORAL_TABLET | ORAL | Status: DC

## 2014-11-16 ENCOUNTER — Telehealth: Payer: Self-pay | Admitting: Family Medicine

## 2014-11-16 ENCOUNTER — Other Ambulatory Visit: Payer: Self-pay | Admitting: *Deleted

## 2014-11-16 MED ORDER — SILDENAFIL CITRATE 20 MG PO TABS: ORAL_TABLET | ORAL | Status: DC

## 2014-11-16 NOTE — Telephone Encounter (Signed)
Pt's new insurance plan will not cover SILDENAFIL at # 90 tablets/18 days (which is how the pharmacy ran the claim)  They will cover #90 tablets/30 days.  Options are for the directions to be changed to 3 tablets per day as needed or we can try to get prior auth for the way the pharmacy ran the claim.  Please advise.  Please see letter in red folder

## 2014-11-16 NOTE — Telephone Encounter (Signed)
Patient wants to stick to 3 a day. Rx sent in electronically to pharmacy. Patient notified.

## 2014-11-16 NOTE — Telephone Encounter (Signed)
Nurses, may redo the prescription to state 3 per day as needed #90, with 5 refills, if this gets rejected by the insurance company this gentleman will just need to get it on his own through [place]

## 2014-12-05 ENCOUNTER — Other Ambulatory Visit: Payer: Self-pay | Admitting: Family Medicine

## 2014-12-29 ENCOUNTER — Ambulatory Visit (INDEPENDENT_AMBULATORY_CARE_PROVIDER_SITE_OTHER): Payer: PPO | Admitting: Family Medicine

## 2014-12-29 ENCOUNTER — Encounter: Payer: Self-pay | Admitting: Family Medicine

## 2014-12-29 VITALS — BP 128/80 | Temp 98.4°F | Ht 72.0 in | Wt 244.8 lb

## 2014-12-29 DIAGNOSIS — J069 Acute upper respiratory infection, unspecified: Secondary | ICD-10-CM

## 2014-12-29 DIAGNOSIS — J019 Acute sinusitis, unspecified: Secondary | ICD-10-CM | POA: Diagnosis not present

## 2014-12-29 DIAGNOSIS — H109 Unspecified conjunctivitis: Secondary | ICD-10-CM | POA: Diagnosis not present

## 2014-12-29 DIAGNOSIS — B9689 Other specified bacterial agents as the cause of diseases classified elsewhere: Secondary | ICD-10-CM

## 2014-12-29 MED ORDER — GENTAMICIN SULFATE 0.3 % OP SOLN: 2.0000 [drp] | Freq: Four times a day (QID) | OPHTHALMIC | Status: DC

## 2014-12-29 MED ORDER — PREDNISONE 20 MG PO TABS
ORAL_TABLET | ORAL | Status: DC
Start: 2014-12-29 — End: 2015-02-14

## 2014-12-29 MED ORDER — LEVOFLOXACIN 500 MG PO TABS: 500.0000 mg | ORAL_TABLET | Freq: Every day | ORAL | Status: DC

## 2014-12-29 NOTE — Progress Notes (Signed)
   Subjective:    Patient ID: Roger Phillips, male    DOB: 29-Apr-1956, 59 y.o.   MRN: [id number]  Cough This is a new problem. Associated symptoms include ear pain, headaches, nasal congestion, rhinorrhea, a sore throat and wheezing. Pertinent negatives include no chest pain or fever. Associated symptoms comments: Eye red swollen and itchy. Treatments tried: zyrtec, eye drops.    PMH allergies COPD increased coughing slight shortness of breath using albuterol when necessary  Review of Systems  Constitutional: Negative for fever and activity change.  HENT: Positive for congestion, ear pain, rhinorrhea and sore throat.   Eyes: Negative for discharge.  Respiratory: Positive for cough and wheezing.   Cardiovascular: Negative for chest pain.  Neurological: Positive for headaches.       Objective:   Physical Exam  Constitutional: He appears well-developed.  HENT:  Head: Normocephalic.  Mouth/Throat: Oropharynx is clear and moist. No oropharyngeal exudate.  Neck: Normal range of motion.  Cardiovascular: Normal rate, regular rhythm and normal heart sounds.   No murmur heard. Pulmonary/Chest: Effort normal and breath sounds normal. He has no wheezes.  Lymphadenopathy:    He has no cervical adenopathy.  Neurological: He exhibits normal muscle tone.  Skin: Skin is warm and dry.  Nursing note and vitals reviewed.         Assessment & Plan:  COPD exacerbation triggered by the upper respiratory illness prednisone taper albuterol when necessary avoid smoking  Allergic rhinitis has allergy medicines Zyrtec that he uses  Conjunctivitis antibiotic eyedrops as directed. Sinusitis antibiotics prescribed warning signs discussed

## 2015-01-02 ENCOUNTER — Other Ambulatory Visit: Payer: Self-pay | Admitting: Family Medicine

## 2015-02-07 ENCOUNTER — Other Ambulatory Visit: Payer: Self-pay | Admitting: Family Medicine

## 2015-02-14 ENCOUNTER — Ambulatory Visit (INDEPENDENT_AMBULATORY_CARE_PROVIDER_SITE_OTHER): Payer: PPO | Admitting: Family Medicine

## 2015-02-14 ENCOUNTER — Encounter: Payer: Self-pay | Admitting: Family Medicine

## 2015-02-14 VITALS — BP 112/80 | Ht 72.0 in | Wt 238.0 lb

## 2015-02-14 DIAGNOSIS — E119 Type 2 diabetes mellitus without complications: Secondary | ICD-10-CM

## 2015-02-14 DIAGNOSIS — I10 Essential (primary) hypertension: Secondary | ICD-10-CM

## 2015-02-14 DIAGNOSIS — E785 Hyperlipidemia, unspecified: Secondary | ICD-10-CM

## 2015-02-14 DIAGNOSIS — Z125 Encounter for screening for malignant neoplasm of prostate: Secondary | ICD-10-CM | POA: Diagnosis not present

## 2015-02-14 DIAGNOSIS — Z79899 Other long term (current) drug therapy: Secondary | ICD-10-CM | POA: Diagnosis not present

## 2015-02-14 LAB — POCT GLYCOSYLATED HEMOGLOBIN (HGB A1C): Hemoglobin A1C: 8.4

## 2015-02-14 MED ORDER — GLIPIZIDE 5 MG PO TABS: ORAL_TABLET | ORAL | Status: DC

## 2015-02-14 NOTE — Progress Notes (Signed)
   Subjective:    Patient ID: Roger Phillips, male    DOB: 01-Aug-1956, 59 y.o.   MRN: [id number]  Diabetes He presents for his follow-up diabetic visit. He has type 2 diabetes mellitus. Pertinent negatives for hypoglycemia include no confusion or headaches. Pertinent negatives for diabetes include no chest pain, no fatigue, no polydipsia, no polyphagia and no weakness. He sees a podiatrist.Eye exam is current.  BS thsi am after eating 147. Pt does not usually check glucose. He stopped metformin because of diarrhea. Pt also cut back glipizide to one bid instead of 2 bid. A1C today 8.4. Pt states no concerns today. PMH benign/tobacco use/diabetes, hyperlipidemia Review of Systems  Constitutional: Negative for activity change, appetite change and fatigue.  HENT: Negative for congestion.   Respiratory: Negative for cough and shortness of breath.   Cardiovascular: Negative for chest pain, palpitations and leg swelling.  Gastrointestinal: Negative for abdominal pain.  Endocrine: Negative for polydipsia and polyphagia.  Genitourinary: Negative for dysuria.  Neurological: Negative for weakness and headaches.  Psychiatric/Behavioral: Negative for confusion.       Objective:   Physical Exam  Constitutional: He appears well-nourished. No distress.  Cardiovascular: Normal rate, regular rhythm and normal heart sounds.   No murmur heard. Pulmonary/Chest: Effort normal and breath sounds normal. No respiratory distress.  Musculoskeletal: He exhibits no edema.  Lymphadenopathy:    He has no cervical adenopathy.  Neurological: He is alert.  Psychiatric: His behavior is normal.  Vitals reviewed.         Assessment & Plan:  This patient has not done well with medications he states often it causes discomfort in his stomach when we tried other medicines he did not like the high cost  He will increase glipizide one half tablet twice a day he will notify us if any problems otherwise recheck A1c  in 3 months  I told him he needs to walk at least 30 minutes daily watch diet closer.  He was encouraged quit smoking.  He is due for metabolic 7 as well as a liver panel lipid panel PSA in order to manage his cholesterol and to monitor his medications. Greater than half time spent in discussion 25 minutes with patient [phone]

## 2015-03-01 LAB — BASIC METABOLIC PANEL
BUN/Creatinine Ratio: 14 (ref 10–22)
BUN: 13 mg/dL (ref 8–27)
CO2: 25 mmol/L (ref 18–29)
Calcium: 9.4 mg/dL (ref 8.6–10.2)
Chloride: 96 mmol/L — ABNORMAL LOW (ref 97–108)
Creatinine, Ser: 0.96 mg/dL (ref 0.76–1.27)
GFR calc Af Amer: 99 mL/min/{1.73_m2} (ref >59)
GFR calc non Af Amer: 86 mL/min/{1.73_m2} (ref >59)
Glucose: 176 mg/dL — ABNORMAL HIGH (ref 65–99)
Potassium: 4 mmol/L (ref 3.5–5.2)
Sodium: 138 mmol/L (ref 134–144)

## 2015-03-01 LAB — HEPATIC FUNCTION PANEL
ALT: 20 IU/L (ref 0–44)
AST: 16 IU/L (ref 0–40)
Albumin: 4.2 g/dL (ref 3.6–4.8)
Alkaline Phosphatase: 74 IU/L (ref 39–117)
Bilirubin Total: 0.5 mg/dL (ref 0.0–1.2)
Bilirubin, Direct: 0.13 mg/dL (ref 0.00–0.40)
Total Protein: 6.5 g/dL (ref 6.0–8.5)

## 2015-03-01 LAB — LIPID PANEL
CHOLESTEROL TOTAL: 174 mg/dL (ref 100–199)
Chol/HDL Ratio: 4.7 ratio units (ref 0.0–5.0)
HDL: 37 mg/dL — AB (ref >39)
LDL Calculated: 95 mg/dL (ref 0–99)
TRIGLYCERIDES: 210 mg/dL — AB (ref 0–149)
VLDL Cholesterol Cal: 42 mg/dL — ABNORMAL HIGH (ref 5–40)

## 2015-03-01 LAB — PSA: Prostate Specific Ag, Serum: 3.1 ng/mL (ref 0.0–4.0)

## 2015-03-05 ENCOUNTER — Encounter: Payer: Self-pay | Admitting: Family Medicine

## 2015-03-07 ENCOUNTER — Other Ambulatory Visit: Payer: Self-pay | Admitting: Family Medicine

## 2015-04-02 ENCOUNTER — Other Ambulatory Visit: Payer: Self-pay | Admitting: Family Medicine

## 2015-04-24 ENCOUNTER — Other Ambulatory Visit: Payer: Self-pay | Admitting: Family Medicine

## 2015-05-14 ENCOUNTER — Other Ambulatory Visit: Payer: Self-pay | Admitting: Family Medicine

## 2015-05-16 ENCOUNTER — Encounter: Payer: Self-pay | Admitting: Family Medicine

## 2015-05-16 ENCOUNTER — Ambulatory Visit (INDEPENDENT_AMBULATORY_CARE_PROVIDER_SITE_OTHER): Payer: PPO | Admitting: Family Medicine

## 2015-05-16 VITALS — BP 136/84 | Ht 72.0 in | Wt 244.2 lb

## 2015-05-16 DIAGNOSIS — E119 Type 2 diabetes mellitus without complications: Secondary | ICD-10-CM | POA: Diagnosis not present

## 2015-05-16 DIAGNOSIS — I10 Essential (primary) hypertension: Secondary | ICD-10-CM

## 2015-05-16 DIAGNOSIS — E785 Hyperlipidemia, unspecified: Secondary | ICD-10-CM | POA: Diagnosis not present

## 2015-05-16 DIAGNOSIS — W57XXXA Bitten or stung by nonvenomous insect and other nonvenomous arthropods, initial encounter: Secondary | ICD-10-CM

## 2015-05-16 MED ORDER — OMEPRAZOLE 20 MG PO CPDR: 20.0000 mg | DELAYED_RELEASE_CAPSULE | Freq: Every day | ORAL | Status: DC

## 2015-05-16 MED ORDER — INSULIN GLARGINE 100 UNIT/ML SOLOSTAR PEN: PEN_INJECTOR | SUBCUTANEOUS | Status: DC

## 2015-05-16 MED ORDER — AMLODIPINE BESYLATE 5 MG PO TABS: ORAL_TABLET | ORAL | Status: DC

## 2015-05-16 MED ORDER — LOSARTAN POTASSIUM 50 MG PO TABS: ORAL_TABLET | ORAL | Status: DC

## 2015-05-16 MED ORDER — HYDROCHLOROTHIAZIDE 25 MG PO TABS: ORAL_TABLET | ORAL | Status: DC

## 2015-05-16 MED ORDER — GLIPIZIDE 5 MG PO TABS: ORAL_TABLET | ORAL | Status: DC

## 2015-05-16 MED ORDER — FLUTICASONE-SALMETEROL 250-50 MCG/DOSE IN AEPB: 1.0000 | INHALATION_SPRAY | Freq: Two times a day (BID) | RESPIRATORY_TRACT | Status: DC

## 2015-05-16 MED ORDER — PRAVASTATIN SODIUM 40 MG PO TABS: ORAL_TABLET | ORAL | Status: DC

## 2015-05-16 NOTE — Patient Instructions (Signed)
Diabetes Mellitus and Food It is important for you to manage your blood sugar (glucose) level. Your blood glucose level can be greatly affected by what you eat. Eating healthier foods in the appropriate amounts throughout the day at about the same time each day will help you control your blood glucose level. It can also help slow or prevent worsening of your diabetes mellitus. Healthy eating may even help you improve the level of your blood pressure and reach or maintain a healthy weight.  HOW CAN FOOD AFFECT ME? Carbohydrates Carbohydrates affect your blood glucose level more than any other type of food. Your dietitian will help you determine how many carbohydrates to eat at each meal and teach you how to count carbohydrates. Counting carbohydrates is important to keep your blood glucose at a healthy level, especially if you are using insulin or taking certain medicines for diabetes mellitus. Alcohol Alcohol can cause sudden decreases in blood glucose (hypoglycemia), especially if you use insulin or take certain medicines for diabetes mellitus. Hypoglycemia can be a life-threatening condition. Symptoms of hypoglycemia (sleepiness, dizziness, and disorientation) are similar to symptoms of having too much alcohol.  If your health care provider has given you approval to drink alcohol, do so in moderation and use the following guidelines:  Women should not have more than one drink per day, and men should not have more than two drinks per day. One drink is equal to:  12 oz of beer.  5 oz of wine.  1 oz of hard liquor.  Do not drink on an empty stomach.  Keep yourself hydrated. Have water, diet soda, or unsweetened iced tea.  Regular soda, juice, and other mixers might contain a lot of carbohydrates and should be counted. WHAT FOODS ARE NOT RECOMMENDED? As you make food choices, it is important to remember that all foods are not the same. Some foods have fewer nutrients per serving than other  foods, even though they might have the same number of calories or carbohydrates. It is difficult to get your body what it needs when you eat foods with fewer nutrients. Examples of foods that you should avoid that are high in calories and carbohydrates but low in nutrients include:  Trans fats (most processed foods list trans fats on the Nutrition Facts label).  Regular soda.  Juice.  Candy.  Sweets, such as cake, pie, doughnuts, and cookies.  Fried foods. WHAT FOODS CAN I EAT? Have nutrient-rich foods, which will nourish your body and keep you healthy. The food you should eat also will depend on several factors, including:  The calories you need.  The medicines you take.  Your weight.  Your blood glucose level.  Your blood pressure level.  Your cholesterol level. You also should eat a variety of foods, including:  Protein, such as meat, poultry, fish, tofu, nuts, and seeds (lean animal proteins are best).  Fruits.  Vegetables.  Dairy products, such as milk, cheese, and yogurt (low fat is best).  Breads, grains, pasta, cereal, rice, and beans.  Fats such as olive oil, trans fat-free margarine, canola oil, avocado, and olives. DOES EVERYONE WITH DIABETES MELLITUS HAVE THE SAME MEAL PLAN? Because every person with diabetes mellitus is different, there is not one meal plan that works for everyone. It is very important that you meet with a dietitian who will help you create a meal plan that is just right for you. Document Released: 06/19/2005 Document Revised: 09/27/2013 Document Reviewed: 08/19/2013 [employer] Patient Information 2015 [employer]. This   information is not intended to replace advice given to you by your health care provider. Make sure you discuss any questions you have with your health care provider.  

## 2015-05-16 NOTE — Progress Notes (Signed)
   Subjective:    Patient ID: Roger Phillips, male    DOB: 26-Nov-1955, 59 y.o.   MRN: [id number]  Diabetes He presents for his follow-up diabetic visit. He has type 2 diabetes mellitus. Pertinent negatives for hypoglycemia include no confusion. Pertinent negatives for diabetes include no chest pain, no fatigue, no polydipsia, no polyphagia and no weakness. Risk factors for coronary artery disease include diabetes mellitus, hypertension and obesity. Current diabetic treatment includes oral agent (monotherapy). He is compliant with treatment all of the time. He is following a diabetic diet. He has not had a previous visit with a dietitian. He does not see a podiatrist.Eye exam is current.  pt does not want to see diabetic educator  Patient wants to discuss expense of advair now hw is in donut hole and discuss testing for lymes disease. Tick bites in June some small some big May have had a fever some aches Some redness around the bites  Review of Systems  Constitutional: Negative for activity change, appetite change and fatigue.  HENT: Negative for congestion.   Respiratory: Negative for cough.   Cardiovascular: Negative for chest pain.  Gastrointestinal: Negative for abdominal pain.  Endocrine: Negative for polydipsia and polyphagia.  Neurological: Negative for weakness.  Psychiatric/Behavioral: Negative for confusion.       Objective:   Physical Exam  Constitutional: He appears well-nourished. No distress.  Cardiovascular: Normal rate, regular rhythm and normal heart sounds.   No murmur heard. Pulmonary/Chest: Effort normal and breath sounds normal. No respiratory distress.  Musculoskeletal: He exhibits no edema.  Lymphadenopathy:    He has no cervical adenopathy.  Neurological: He is alert.  Psychiatric: His behavior is normal.  Vitals reviewed.         Assessment & Plan:  1. Tick bite  he relates multiple tick bites. We will be doing some tick-related testing he states  since a tick bites off and on arthralgias at times muscle aches some low-grade fevers may end up needing to be on medications but I find no sign of any active disease right at the moment - B. burgdorfi antibodies by WB - [place] mtn spotted fvr abs pnl(IgG+IgM)  2. Type 2 diabetes mellitus without complication  diabetes probable subpar start long-acting insulin Lantus at nighttime start off at 6 units titrate gradually to get fasting sugars 100 -120 - Hemoglobin A1c  3. Essential hypertension  blood pressure decent control continue current measures watch diet encourage patient to quit smoking  4. Hyperlipidemia  continue current medication watch diet   will change Advair used 250-this is cheaper than the 500th

## 2015-05-31 LAB — HEMOGLOBIN A1C
Est. average glucose Bld gHb Est-mCnc: 194 mg/dL
Hgb A1c MFr Bld: 8.4 % — ABNORMAL HIGH (ref 4.8–5.6)

## 2015-06-01 LAB — LYME DISEASE, WESTERN BLOT
IGG P28 AB.: ABSENT
IGG P39 AB.: ABSENT
IGG P41 AB.: ABSENT
IGG P93 AB.: ABSENT
IGM P39 AB.: ABSENT
IGM P41 AB.: ABSENT
IgG P18 Ab.: ABSENT
IgG P23 Ab.: ABSENT
IgG P30 Ab.: ABSENT
IgG P45 Ab.: ABSENT
IgG P58 Ab.: ABSENT
IgG P66 Ab.: ABSENT
Lyme IgG Wb: NEGATIVE
Lyme IgM Wb: NEGATIVE

## 2015-06-01 LAB — ROCKY MTN SPOTTED FVR ABS PNL(IGG+IGM)
RMSF IGG: NEGATIVE
RMSF IgM: 0.22 index (ref 0.00–0.89)

## 2015-06-01 LAB — C6 B. BURGDORFERI (LYME): C6 BORRELIA BURGDORFERI (LYME): 0.93 {index} — AB (ref 0.00–0.90)

## 2015-07-03 ENCOUNTER — Ambulatory Visit (INDEPENDENT_AMBULATORY_CARE_PROVIDER_SITE_OTHER): Payer: PPO | Admitting: *Deleted

## 2015-07-03 DIAGNOSIS — Z23 Encounter for immunization: Secondary | ICD-10-CM

## 2015-08-14 ENCOUNTER — Other Ambulatory Visit: Payer: Self-pay | Admitting: Family Medicine

## 2015-08-15 NOTE — Telephone Encounter (Signed)
Name refill this plus one additional refill

## 2015-08-28 ENCOUNTER — Ambulatory Visit (INDEPENDENT_AMBULATORY_CARE_PROVIDER_SITE_OTHER): Payer: PPO | Admitting: Family Medicine

## 2015-08-28 ENCOUNTER — Encounter: Payer: Self-pay | Admitting: Family Medicine

## 2015-08-28 VITALS — BP 124/78 | Ht 72.0 in | Wt 245.4 lb

## 2015-08-28 DIAGNOSIS — J438 Other emphysema: Secondary | ICD-10-CM

## 2015-08-28 DIAGNOSIS — E1165 Type 2 diabetes mellitus with hyperglycemia: Secondary | ICD-10-CM | POA: Diagnosis not present

## 2015-08-28 DIAGNOSIS — E785 Hyperlipidemia, unspecified: Secondary | ICD-10-CM | POA: Diagnosis not present

## 2015-08-28 DIAGNOSIS — Z720 Tobacco use: Secondary | ICD-10-CM | POA: Diagnosis not present

## 2015-08-28 DIAGNOSIS — I10 Essential (primary) hypertension: Secondary | ICD-10-CM | POA: Diagnosis not present

## 2015-08-28 DIAGNOSIS — E119 Type 2 diabetes mellitus without complications: Secondary | ICD-10-CM

## 2015-08-28 DIAGNOSIS — M545 Low back pain: Secondary | ICD-10-CM

## 2015-08-28 DIAGNOSIS — IMO0001 Reserved for inherently not codable concepts without codable children: Secondary | ICD-10-CM

## 2015-08-28 LAB — POCT GLYCOSYLATED HEMOGLOBIN (HGB A1C): HEMOGLOBIN A1C: 8.1

## 2015-08-28 MED ORDER — SILDENAFIL CITRATE 20 MG PO TABS: ORAL_TABLET | ORAL | Status: DC

## 2015-08-28 NOTE — Progress Notes (Signed)
Subjective:    Patient ID: Roger Phillips, male    DOB: 1956/03/30, 59 y.o.   MRN: [id number]  Diabetes He presents for his follow-up diabetic visit. He has type 2 diabetes mellitus. No MedicAlert identification noted. Pertinent negatives for hypoglycemia include no confusion. Pertinent negatives for diabetes include no chest pain, no fatigue, no polydipsia, no polyphagia and no weakness. He has not had a previous visit with a dietitian. He does not see a podiatrist.Eye exam is current (Febuary 2016).   Patient hemoglobin A1c today: 8.1  The patient is not using his insulin he does not follow a diet does not want to see a diabetic specialist and doesn't want to see a dietitian long discussion was held with the patient how reducing the risk of kidney disease blindness loss of limb heart attack strokes is in his best interest by getting his sugar under better control we talked about various measures he would agree to he will try to watch certain anxiety eats and he will increase his glipizide  He does relate some shortness breath with activity he does know that he needs to quit smoking he is not willing to quit currently he is going to try to cut down but he does not promise he uses his inhalers they seem to help so  He denies chest tightness pressure pain does have a history hypertension and heart disease we talked about importance keeping cholesterol under control and taking his medicines.  Review of Systems  Constitutional: Negative for activity change, appetite change and fatigue.  HENT: Negative for congestion.   Respiratory: Negative for cough.   Cardiovascular: Negative for chest pain.  Gastrointestinal: Negative for abdominal pain.  Endocrine: Negative for polydipsia and polyphagia.  Neurological: Negative for weakness.  Psychiatric/Behavioral: Negative for confusion.       Objective:   Physical Exam  Constitutional: He appears well-nourished. No distress.  Cardiovascular:  Normal rate, regular rhythm and normal heart sounds.   No murmur heard. Pulmonary/Chest: Effort normal and breath sounds normal. No respiratory distress.  Musculoskeletal: He exhibits no edema.  Lymphadenopathy:    He has no cervical adenopathy.  Neurological: He is alert.  Psychiatric: His behavior is normal.  Vitals reviewed.         Assessment & Plan:  1. Type 2 diabetes mellitus without complication, unspecified long term insulin use status ([place]) Poor control patient needs to do a better job does not want take insulin. Therefore we will use glipizide one half tablet twice daily he does not want to see a specialist follow-up within 3-4 months check A1c - POCT HgB A1C  2. Essential hypertension Blood pressure good check metabolic panel continue current medication - Basic metabolic panel  3. Uncontrolled type 2 diabetes mellitus without complication, without long-term current use of insulin ([place]) Additional lab work see above discussion - Microalbumin / creatinine urine ratio - Hepatic function panel  4. Hyperlipidemia Hyperlipidemia continue medication watch diet check lab work - Lipid panel  5. Other emphysema ([place]) Emphysema importance of using his medications as directed importance of quitting smoking  6. Tobacco abuse Advise quitting smoking  7. Low back pain without sciatica, unspecified back pain laterality If necessary hydrocodone but for the most part he uses Tylenol and tries to minimize his activity he was encourage exercise on a regular basis  25 minutes was spent with the patient. Greater than half the time was spent in discussion and answering questions and counseling regarding the issues that the patient  came in for today.

## 2015-09-19 ENCOUNTER — Other Ambulatory Visit: Payer: Self-pay

## 2015-09-19 MED ORDER — ALBUTEROL SULFATE (2.5 MG/3ML) 0.083% IN NEBU: 2.5000 mg | INHALATION_SOLUTION | Freq: Four times a day (QID) | RESPIRATORY_TRACT | Status: DC | PRN

## 2015-11-28 ENCOUNTER — Ambulatory Visit: Payer: PPO | Admitting: Family Medicine

## 2015-12-10 ENCOUNTER — Ambulatory Visit (INDEPENDENT_AMBULATORY_CARE_PROVIDER_SITE_OTHER): Payer: PPO | Admitting: Family Medicine

## 2015-12-10 ENCOUNTER — Encounter: Payer: Self-pay | Admitting: Family Medicine

## 2015-12-10 VITALS — BP 104/78 | Ht 72.0 in | Wt 240.1 lb

## 2015-12-10 DIAGNOSIS — J438 Other emphysema: Secondary | ICD-10-CM | POA: Diagnosis not present

## 2015-12-10 DIAGNOSIS — M545 Low back pain: Secondary | ICD-10-CM | POA: Diagnosis not present

## 2015-12-10 DIAGNOSIS — E119 Type 2 diabetes mellitus without complications: Secondary | ICD-10-CM | POA: Diagnosis not present

## 2015-12-10 DIAGNOSIS — I10 Essential (primary) hypertension: Secondary | ICD-10-CM | POA: Diagnosis not present

## 2015-12-10 DIAGNOSIS — IMO0001 Reserved for inherently not codable concepts without codable children: Secondary | ICD-10-CM

## 2015-12-10 DIAGNOSIS — E1165 Type 2 diabetes mellitus with hyperglycemia: Secondary | ICD-10-CM | POA: Diagnosis not present

## 2015-12-10 DIAGNOSIS — E785 Hyperlipidemia, unspecified: Secondary | ICD-10-CM

## 2015-12-10 LAB — POCT GLYCOSYLATED HEMOGLOBIN (HGB A1C): HEMOGLOBIN A1C: 8.4

## 2015-12-10 MED ORDER — LINAGLIPTIN 5 MG PO TABS
5.0000 mg | ORAL_TABLET | Freq: Every day | ORAL | Status: DC
Start: 2015-12-10 — End: 2016-07-10

## 2015-12-10 MED ORDER — GLIPIZIDE 10 MG PO TABS: ORAL_TABLET | ORAL | Status: DC

## 2015-12-10 NOTE — Patient Instructions (Signed)
Diabetes Mellitus and Food It is important for you to manage your blood sugar (glucose) level. Your blood glucose level can be greatly affected by what you eat. Eating healthier foods in the appropriate amounts throughout the day at about the same time each day will help you control your blood glucose level. It can also help slow or prevent worsening of your diabetes mellitus. Healthy eating may even help you improve the level of your blood pressure and reach or maintain a healthy weight.  General recommendations for healthful eating and cooking habits include:  Eating meals and snacks regularly. Avoid going long periods of time without eating to lose weight.  Eating a diet that consists mainly of plant-based foods, such as fruits, vegetables, nuts, legumes, and whole grains.  Using low-heat cooking methods, such as baking, instead of high-heat cooking methods, such as deep frying. Work with your dietitian to make sure you understand how to use the Nutrition Facts information on food labels. HOW CAN FOOD AFFECT ME? Carbohydrates Carbohydrates affect your blood glucose level more than any other type of food. Your dietitian will help you determine how many carbohydrates to eat at each meal and teach you how to count carbohydrates. Counting carbohydrates is important to keep your blood glucose at a healthy level, especially if you are using insulin or taking certain medicines for diabetes mellitus. Alcohol Alcohol can cause sudden decreases in blood glucose (hypoglycemia), especially if you use insulin or take certain medicines for diabetes mellitus. Hypoglycemia can be a life-threatening condition. Symptoms of hypoglycemia (sleepiness, dizziness, and disorientation) are similar to symptoms of having too much alcohol.  If your health care provider has given you approval to drink alcohol, do so in moderation and use the following guidelines:  Women should not have more than one drink per day, and men  should not have more than two drinks per day. One drink is equal to:  12 oz of beer.  5 oz of wine.  1 oz of hard liquor.  Do not drink on an empty stomach.  Keep yourself hydrated. Have water, diet soda, or unsweetened iced tea.  Regular soda, juice, and other mixers might contain a lot of carbohydrates and should be counted. WHAT FOODS ARE NOT RECOMMENDED? As you make food choices, it is important to remember that all foods are not the same. Some foods have fewer nutrients per serving than other foods, even though they might have the same number of calories or carbohydrates. It is difficult to get your body what it needs when you eat foods with fewer nutrients. Examples of foods that you should avoid that are high in calories and carbohydrates but low in nutrients include:  Trans fats (most processed foods list trans fats on the Nutrition Facts label).  Regular soda.  Juice.  Candy.  Sweets, such as cake, pie, doughnuts, and cookies.  Fried foods. WHAT FOODS CAN I EAT? Eat nutrient-rich foods, which will nourish your body and keep you healthy. The food you should eat also will depend on several factors, including:  The calories you need.  The medicines you take.  Your weight.  Your blood glucose level.  Your blood pressure level.  Your cholesterol level. You should eat a variety of foods, including:  Protein.  Lean cuts of meat.  Proteins low in saturated fats, such as fish, egg whites, and beans. Avoid processed meats.  Fruits and vegetables.  Fruits and vegetables that may help control blood glucose levels, such as apples, mangoes, and   yams.  Dairy products.  Choose fat-free or low-fat dairy products, such as milk, yogurt, and cheese.  Grains, bread, pasta, and rice.  Choose whole grain products, such as multigrain bread, whole oats, and brown rice. These foods may help control blood pressure.  Fats.  Foods containing healthful fats, such as nuts,  avocado, olive oil, canola oil, and fish. DOES EVERYONE WITH DIABETES MELLITUS HAVE THE SAME MEAL PLAN? Because every person with diabetes mellitus is different, there is not one meal plan that works for everyone. It is very important that you meet with a dietitian who will help you create a meal plan that is just right for you.   This information is not intended to replace advice given to you by your health care provider. Make sure you discuss any questions you have with your health care provider.   Document Released: 06/19/2005 Document Revised: 10/13/2014 Document Reviewed: 08/19/2013 Elsevier Interactive Patient Education 2016 [employer]  

## 2015-12-10 NOTE — Progress Notes (Signed)
   Subjective:    Patient ID: Roger Phillips, male    DOB: 08-03-1956, 60 y.o.   MRN: [id number]  Diabetes He presents for his follow-up diabetic visit. He has type 2 diabetes mellitus. There are no hypoglycemic associated symptoms. There are no diabetic associated symptoms. There are no hypoglycemic complications. There are no diabetic complications. There are no known risk factors for coronary artery disease. Current diabetic treatment includes oral agent (monotherapy). He is compliant with treatment all of the time.   Patient states he has no concerns at this time.  This patient does relate that he has a fair amount of indiscretions when it comes to his diet he does not do any particular type of exercise. He does not always check sugars. Patient with chronic low back pain although he is managing it Review of Systems Patient denies excessive thirst urination. Denies vomiting diarrhea. Denies fever chills sweats.    Objective:   Physical Exam Lungs clear heart regular pulse normal extremities no edema skin warm dry neck no masses  25 minutes was spent with the patient. Greater than half the time was spent in discussion and answering questions and counseling regarding the issues that the patient came in for today.   diabetic foot exam completed normal    Assessment & Plan:   diabetes subpar control increase glipizide 10 mg twice a day. Also add Tradjenta 5 mg daily. If low blood sugars to let us know  Blood pressure good control currently  Hyperlipidemia check lab work. Await the results   COPD stable patient was counseled to quit smoking. Continue inhalers.  Follow-up in a proximally 4 months

## 2016-01-02 ENCOUNTER — Other Ambulatory Visit: Payer: Self-pay | Admitting: Family Medicine

## 2016-01-16 ENCOUNTER — Other Ambulatory Visit: Payer: Self-pay | Admitting: Family Medicine

## 2016-02-11 ENCOUNTER — Other Ambulatory Visit: Payer: Self-pay | Admitting: Family Medicine

## 2016-02-12 NOTE — Telephone Encounter (Signed)
May have both prescriptions +5 refills

## 2016-03-05 ENCOUNTER — Other Ambulatory Visit: Payer: Self-pay | Admitting: Family Medicine

## 2016-03-05 DIAGNOSIS — I10 Essential (primary) hypertension: Secondary | ICD-10-CM | POA: Diagnosis not present

## 2016-03-05 DIAGNOSIS — E119 Type 2 diabetes mellitus without complications: Secondary | ICD-10-CM | POA: Diagnosis not present

## 2016-03-05 DIAGNOSIS — E785 Hyperlipidemia, unspecified: Secondary | ICD-10-CM | POA: Diagnosis not present

## 2016-03-06 LAB — BASIC METABOLIC PANEL
BUN/Creatinine Ratio: 15 (ref 10–24)
BUN: 17 mg/dL (ref 8–27)
CALCIUM: 9.5 mg/dL (ref 8.6–10.2)
CO2: 26 mmol/L (ref 18–29)
CREATININE: 1.15 mg/dL (ref 0.76–1.27)
Chloride: 93 mmol/L — ABNORMAL LOW (ref 96–106)
GFR calc Af Amer: 79 mL/min/{1.73_m2} (ref >59)
GFR, EST NON AFRICAN AMERICAN: 68 mL/min/{1.73_m2} (ref >59)
GLUCOSE: 210 mg/dL — AB (ref 65–99)
POTASSIUM: 4 mmol/L (ref 3.5–5.2)
Sodium: 137 mmol/L (ref 134–144)

## 2016-03-06 LAB — HEPATIC FUNCTION PANEL
ALBUMIN: 4.3 g/dL (ref 3.6–4.8)
ALT: 14 IU/L (ref 0–44)
AST: 12 IU/L (ref 0–40)
Alkaline Phosphatase: 71 IU/L (ref 39–117)
Bilirubin Total: 0.6 mg/dL (ref 0.0–1.2)
Bilirubin, Direct: 0.18 mg/dL (ref 0.00–0.40)
TOTAL PROTEIN: 6.9 g/dL (ref 6.0–8.5)

## 2016-03-06 LAB — LIPID PANEL
Chol/HDL Ratio: 5.2 ratio units — ABNORMAL HIGH (ref 0.0–5.0)
Cholesterol, Total: 176 mg/dL (ref 100–199)
HDL: 34 mg/dL — AB (ref >39)
LDL CALC: 84 mg/dL (ref 0–99)
Triglycerides: 289 mg/dL — ABNORMAL HIGH (ref 0–149)
VLDL CHOLESTEROL CAL: 58 mg/dL — AB (ref 5–40)

## 2016-03-06 LAB — MICROALBUMIN / CREATININE URINE RATIO
CREATININE, UR: 112.6 mg/dL
MICROALB/CREAT RATIO: 30 mg/g{creat} (ref 0.0–30.0)
MICROALBUM., U, RANDOM: 33.8 ug/mL

## 2016-03-11 ENCOUNTER — Ambulatory Visit (INDEPENDENT_AMBULATORY_CARE_PROVIDER_SITE_OTHER): Payer: PPO | Admitting: Family Medicine

## 2016-03-11 ENCOUNTER — Encounter: Payer: Self-pay | Admitting: Family Medicine

## 2016-03-11 VITALS — BP 114/76 | Ht 72.0 in | Wt 241.5 lb

## 2016-03-11 DIAGNOSIS — N200 Calculus of kidney: Secondary | ICD-10-CM

## 2016-03-11 DIAGNOSIS — E119 Type 2 diabetes mellitus without complications: Secondary | ICD-10-CM | POA: Diagnosis not present

## 2016-03-11 LAB — POCT GLYCOSYLATED HEMOGLOBIN (HGB A1C): HEMOGLOBIN A1C: 8.7

## 2016-03-11 MED ORDER — OXYCODONE-ACETAMINOPHEN 10-325 MG PO TABS: 1.0000 | ORAL_TABLET | ORAL | Status: DC | PRN

## 2016-03-11 NOTE — Progress Notes (Signed)
   Subjective:    Patient ID: Roger Phillips, male    DOB: October 16, 1955, 60 y.o.   MRN: [id number]  Diabetes He presents for his follow-up diabetic visit. He has type 2 diabetes mellitus. No MedicAlert identification noted. He has not had a previous visit with a dietitian. He does not see a podiatrist.Eye exam is not current.    Results for orders placed or performed in visit on 03/11/16  POCT HgB A1C  Result Value Ref Range   Hemoglobin A1C 8.7    This patient had sugars in the 200 range unfortunately has not done a good job keeping under control he states that he is not able to afford any additional medications currently patient has been counseled to quit smoking  Review of Systems Patient denies chest tightness pressure pain shortness breath nausea vomiting diarrhea. States his sugars have been running high    Objective:   Physical Exam  Lungs clear hearts regular pulse normal extremities no edema skin warm dry diabetic foot exam completed      Assessment & Plan:  Diabetes very poor control patient states he tries watch how he eats he is trying to stay active he does not tolerate metformin can only afford glipizide when I have tried other medications he is not been able to afford them. We will see if his insurance company has any type of assistance program.  Kidney stone-sending kidney stone for analysis patient has been passing kidney stones brings him in today Percocet written for severe pain use only home for kidney stone pain if severe pain or doesn't get better needs to go to ER finally encourage decent amount of fluid intake water preferably

## 2016-03-20 LAB — STONE ANALYSIS
CA OXALATE, MONOHYDR.: 5 %
CA PHOS CRY STONE QL IR: 3 %
STONE WEIGHT: 147 mg
Uric Acid: 92 %

## 2016-03-21 NOTE — Addendum Note (Signed)
Addended by: Launa Grill on: 03/21/2016 09:13 AM   Modules accepted: Orders

## 2016-03-29 ENCOUNTER — Encounter: Payer: Self-pay | Admitting: Family Medicine

## 2016-03-31 NOTE — Telephone Encounter (Signed)
Nurse's-please call the patient. Uric acid level was ordered. I'm not sure what they're question is. They just need to go by the lab and get it drawn.

## 2016-04-01 NOTE — Telephone Encounter (Signed)
Nurse's-please see previous message that I did. This patient does need to get his uric acid level drawn. The order is already in the system please inform family all they need to do is go by and get the lab drawn

## 2016-04-09 ENCOUNTER — Encounter: Payer: Self-pay | Admitting: Family Medicine

## 2016-04-09 ENCOUNTER — Other Ambulatory Visit: Payer: Self-pay | Admitting: *Deleted

## 2016-04-09 DIAGNOSIS — N200 Calculus of kidney: Secondary | ICD-10-CM

## 2016-04-09 NOTE — Telephone Encounter (Signed)
I did respond on the previous message I do not know why did not go through. I would recommend uric acid level. May need to be on a medication to help lower the uric acid. I recommend uric acid level and metabolic 7. The nurses will order this test please get it drawn typically takes a few days to get the results but we should be able to know by at least Monday.

## 2016-04-15 DIAGNOSIS — N200 Calculus of kidney: Secondary | ICD-10-CM | POA: Diagnosis not present

## 2016-04-16 LAB — BASIC METABOLIC PANEL
BUN / CREAT RATIO: 19 (ref 10–24)
BUN: 20 mg/dL (ref 8–27)
CO2: 26 mmol/L (ref 18–29)
Calcium: 9.5 mg/dL (ref 8.6–10.2)
Chloride: 95 mmol/L — ABNORMAL LOW (ref 96–106)
Creatinine, Ser: 1.03 mg/dL (ref 0.76–1.27)
GFR calc Af Amer: 90 mL/min/{1.73_m2} (ref >59)
GFR, EST NON AFRICAN AMERICAN: 78 mL/min/{1.73_m2} (ref >59)
Glucose: 218 mg/dL — ABNORMAL HIGH (ref 65–99)
POTASSIUM: 4.5 mmol/L (ref 3.5–5.2)
SODIUM: 140 mmol/L (ref 134–144)

## 2016-04-16 LAB — URIC ACID: Uric Acid: 4.8 mg/dL (ref 3.7–8.6)

## 2016-05-05 ENCOUNTER — Other Ambulatory Visit: Payer: Self-pay | Admitting: Family Medicine

## 2016-06-02 ENCOUNTER — Other Ambulatory Visit: Payer: Self-pay | Admitting: Family Medicine

## 2016-07-02 ENCOUNTER — Other Ambulatory Visit: Payer: Self-pay | Admitting: Family Medicine

## 2016-07-03 ENCOUNTER — Other Ambulatory Visit: Payer: Self-pay | Admitting: Family Medicine

## 2016-07-10 ENCOUNTER — Encounter: Payer: Self-pay | Admitting: Family Medicine

## 2016-07-10 ENCOUNTER — Ambulatory Visit (INDEPENDENT_AMBULATORY_CARE_PROVIDER_SITE_OTHER): Payer: PPO | Admitting: Family Medicine

## 2016-07-10 VITALS — BP 132/82 | Ht 72.0 in | Wt 238.8 lb

## 2016-07-10 DIAGNOSIS — E119 Type 2 diabetes mellitus without complications: Secondary | ICD-10-CM

## 2016-07-10 DIAGNOSIS — Z23 Encounter for immunization: Secondary | ICD-10-CM

## 2016-07-10 DIAGNOSIS — J438 Other emphysema: Secondary | ICD-10-CM | POA: Diagnosis not present

## 2016-07-10 DIAGNOSIS — I10 Essential (primary) hypertension: Secondary | ICD-10-CM | POA: Diagnosis not present

## 2016-07-10 DIAGNOSIS — E7849 Other hyperlipidemia: Secondary | ICD-10-CM

## 2016-07-10 DIAGNOSIS — E784 Other hyperlipidemia: Secondary | ICD-10-CM

## 2016-07-10 LAB — POCT GLYCOSYLATED HEMOGLOBIN (HGB A1C): HEMOGLOBIN A1C: 8.7

## 2016-07-10 MED ORDER — ALBUTEROL SULFATE (2.5 MG/3ML) 0.083% IN NEBU: 2.5000 mg | INHALATION_SOLUTION | Freq: Four times a day (QID) | RESPIRATORY_TRACT | 6 refills | Status: DC | PRN

## 2016-07-10 MED ORDER — EMPAGLIFLOZIN 10 MG PO TABS: 10.0000 mg | ORAL_TABLET | Freq: Every day | ORAL | 5 refills | Status: DC

## 2016-07-10 NOTE — Patient Instructions (Signed)
Diabetes Mellitus and Food It is important for you to manage your blood sugar (glucose) level. Your blood glucose level can be greatly affected by what you eat. Eating healthier foods in the appropriate amounts throughout the day at about the same time each day will help you control your blood glucose level. It can also help slow or prevent worsening of your diabetes mellitus. Healthy eating may even help you improve the level of your blood pressure and reach or maintain a healthy weight.  General recommendations for healthful eating and cooking habits include:  Eating meals and snacks regularly. Avoid going long periods of time without eating to lose weight.  Eating a diet that consists mainly of plant-based foods, such as fruits, vegetables, nuts, legumes, and whole grains.  Using low-heat cooking methods, such as baking, instead of high-heat cooking methods, such as deep frying. Work with your dietitian to make sure you understand how to use the Nutrition Facts information on food labels. HOW CAN FOOD AFFECT ME? Carbohydrates Carbohydrates affect your blood glucose level more than any other type of food. Your dietitian will help you determine how many carbohydrates to eat at each meal and teach you how to count carbohydrates. Counting carbohydrates is important to keep your blood glucose at a healthy level, especially if you are using insulin or taking certain medicines for diabetes mellitus. Alcohol Alcohol can cause sudden decreases in blood glucose (hypoglycemia), especially if you use insulin or take certain medicines for diabetes mellitus. Hypoglycemia can be a life-threatening condition. Symptoms of hypoglycemia (sleepiness, dizziness, and disorientation) are similar to symptoms of having too much alcohol.  If your health care provider has given you approval to drink alcohol, do so in moderation and use the following guidelines:  Women should not have more than one drink per day, and men  should not have more than two drinks per day. One drink is equal to:  12 oz of beer.  5 oz of wine.  1 oz of hard liquor.  Do not drink on an empty stomach.  Keep yourself hydrated. Have water, diet soda, or unsweetened iced tea.  Regular soda, juice, and other mixers might contain a lot of carbohydrates and should be counted. WHAT FOODS ARE NOT RECOMMENDED? As you make food choices, it is important to remember that all foods are not the same. Some foods have fewer nutrients per serving than other foods, even though they might have the same number of calories or carbohydrates. It is difficult to get your body what it needs when you eat foods with fewer nutrients. Examples of foods that you should avoid that are high in calories and carbohydrates but low in nutrients include:  Trans fats (most processed foods list trans fats on the Nutrition Facts label).  Regular soda.  Juice.  Candy.  Sweets, such as cake, pie, doughnuts, and cookies.  Fried foods. WHAT FOODS CAN I EAT? Eat nutrient-rich foods, which will nourish your body and keep you healthy. The food you should eat also will depend on several factors, including:  The calories you need.  The medicines you take.  Your weight.  Your blood glucose level.  Your blood pressure level.  Your cholesterol level. You should eat a variety of foods, including:  Protein.  Lean cuts of meat.  Proteins low in saturated fats, such as fish, egg whites, and beans. Avoid processed meats.  Fruits and vegetables.  Fruits and vegetables that may help control blood glucose levels, such as apples, mangoes, and   yams.  Dairy products.  Choose fat-free or low-fat dairy products, such as milk, yogurt, and cheese.  Grains, bread, pasta, and rice.  Choose whole grain products, such as multigrain bread, whole oats, and brown rice. These foods may help control blood pressure.  Fats.  Foods containing healthful fats, such as nuts,  avocado, olive oil, canola oil, and fish. DOES EVERYONE WITH DIABETES MELLITUS HAVE THE SAME MEAL PLAN? Because every person with diabetes mellitus is different, there is not one meal plan that works for everyone. It is very important that you meet with a dietitian who will help you create a meal plan that is just right for you.   This information is not intended to replace advice given to you by your health care provider. Make sure you discuss any questions you have with your health care provider.   Document Released: 06/19/2005 Document Revised: 10/13/2014 Document Reviewed: 08/19/2013 Elsevier Interactive Patient Education 2016 [employer]  

## 2016-07-10 NOTE — Progress Notes (Signed)
   Subjective:    Patient ID: Roger Phillips, male    DOB: [birth year], 60 y.o.   MRN: [id number]  Diabetes  He presents for his follow-up diabetic visit. He has type 2 diabetes mellitus. Risk factors for coronary artery disease include diabetes mellitus, dyslipidemia and hypertension. Current diabetic treatment includes oral agent (dual therapy). He is compliant with treatment all of the time. His weight is stable. He is following a diabetic diet. He has not had a previous visit with a dietitian. He does not see a podiatrist.Eye exam is not current.  Patient did not take Tradjenta because he had side effects with it. He states it made him feel He does not watch his diet he does not exercise He still smokes he knows he needs to quit He does take cholesterol medicine denies problems with Takes his acid reflux medicine no problems with it Takes his asthma medicine it keeps things under good control. Does take his 81 mg aspirin.    Review of Systems Patient denies any chest tightness pressure pain shortness of breath except when he tries to push himself he does get short of breath denies swelling in the legs no burning in the feet no headaches no fever chills or sweats    Objective:   Physical Exam HEENT benign neck no masses lungs clear no crackles heart is regular abdomen soft diabetic foot exam completed       Assessment & Plan:  Diabetes subpar control add Jardiance 10 mg side effects discussed Importance of eating better and regular physical activity discussed. Patient does not want to go on insulin. Patient does not check his sugars on a regular basis nor will he Follow-up for A1c in approximately 3 months  COPD emphysema asthma stable continue current measures encourage patient quit smoking unlikely bowel habit Blood pressure good control continue current measures Hyperlipidemia lab work ordered await the results previous labs reviewed with patient Continue medication 25  minutes spent with patient greater than half in discussion.

## 2016-07-16 ENCOUNTER — Telehealth: Payer: Self-pay | Admitting: *Deleted

## 2016-07-16 NOTE — Telephone Encounter (Signed)
Patient did not take the medication cause of the cost and he is watching diet and he will repeat A1c in 3 months

## 2016-07-29 ENCOUNTER — Encounter: Payer: Self-pay | Admitting: Family Medicine

## 2016-07-29 DIAGNOSIS — M109 Gout, unspecified: Secondary | ICD-10-CM

## 2016-07-30 NOTE — Addendum Note (Signed)
Addended by: Dairl Ponder on: 07/30/2016 04:51 PM   Modules accepted: Orders

## 2016-07-30 NOTE — Telephone Encounter (Signed)
Please have the patient do a uric acid level.

## 2016-08-06 ENCOUNTER — Other Ambulatory Visit: Payer: Self-pay | Admitting: Family Medicine

## 2016-08-20 ENCOUNTER — Other Ambulatory Visit: Payer: Self-pay | Admitting: Family Medicine

## 2016-08-21 ENCOUNTER — Other Ambulatory Visit: Payer: Self-pay | Admitting: *Deleted

## 2016-08-21 MED ORDER — DIPHENOXYLATE-ATROPINE 2.5-0.025 MG PO TABS: ORAL_TABLET | ORAL | 3 refills | Status: DC

## 2016-08-21 NOTE — Telephone Encounter (Signed)
Patient may have a prescription but for 45 tablets only one 3 times a day when necessary

## 2016-08-26 ENCOUNTER — Other Ambulatory Visit: Payer: Self-pay | Admitting: *Deleted

## 2016-08-26 MED ORDER — DIPHENOXYLATE-ATROPINE 2.5-0.025 MG PO TABS: ORAL_TABLET | ORAL | 0 refills | Status: DC

## 2016-08-26 NOTE — Telephone Encounter (Signed)
Just seem like we did this. This appears to be a repeat. Feel free to do it again

## 2016-09-02 ENCOUNTER — Other Ambulatory Visit: Payer: Self-pay | Admitting: Family Medicine

## 2016-09-08 ENCOUNTER — Encounter: Payer: Self-pay | Admitting: Family Medicine

## 2016-09-08 ENCOUNTER — Ambulatory Visit (INDEPENDENT_AMBULATORY_CARE_PROVIDER_SITE_OTHER): Payer: PPO | Admitting: Family Medicine

## 2016-09-08 VITALS — BP 124/82 | Temp 98.4°F | Ht 72.0 in | Wt 233.5 lb

## 2016-09-08 DIAGNOSIS — J441 Chronic obstructive pulmonary disease with (acute) exacerbation: Secondary | ICD-10-CM

## 2016-09-08 DIAGNOSIS — J019 Acute sinusitis, unspecified: Secondary | ICD-10-CM

## 2016-09-08 DIAGNOSIS — B9689 Other specified bacterial agents as the cause of diseases classified elsewhere: Secondary | ICD-10-CM | POA: Diagnosis not present

## 2016-09-08 MED ORDER — AZITHROMYCIN 250 MG PO TABS: ORAL_TABLET | ORAL | 0 refills | Status: DC

## 2016-09-08 MED ORDER — PREDNISONE 20 MG PO TABS: ORAL_TABLET | ORAL | 0 refills | Status: DC

## 2016-09-08 MED ORDER — HYDROCODONE-HOMATROPINE 5-1.5 MG/5ML PO SYRP: 5.0000 mL | ORAL_SOLUTION | Freq: Four times a day (QID) | ORAL | 0 refills | Status: DC | PRN

## 2016-09-08 NOTE — Progress Notes (Signed)
   Subjective:    Patient ID: Roger Phillips, male    DOB: 08-Mar-1956, 60 y.o.   MRN: [id number]  Sinusitis  This is a new problem. The current episode started in the past 7 days. The pain is moderate. Associated symptoms include congestion, coughing, ear pain, headaches, shortness of breath and a sore throat. (Fever, achy) Past treatments include oral decongestants. The treatment provided no relief.  This patient is a smoker he has had a lot of cough congestion some shortness breath using albuterol seems to be helping some denies high fever chills relates coughing up discolored phlegm    Review of Systems  Constitutional: Negative for activity change and fever.  HENT: Positive for congestion, ear pain, rhinorrhea and sore throat.   Eyes: Negative for discharge.  Respiratory: Positive for cough and shortness of breath. Negative for wheezing.   Cardiovascular: Negative for chest pain.  Neurological: Positive for headaches.       Objective:   Physical Exam  Constitutional: He appears well-developed.  HENT:  Head: Normocephalic.  Mouth/Throat: Oropharynx is clear and moist. No oropharyngeal exudate.  Neck: Normal range of motion.  Cardiovascular: Normal rate, regular rhythm and normal heart sounds.   No murmur heard. Pulmonary/Chest: No respiratory distress. He has wheezes.  Lymphadenopathy:    He has no cervical adenopathy.  Neurological: He exhibits normal muscle tone.  Skin: Skin is warm and dry.  Nursing note and vitals reviewed.         Assessment & Plan:  COPD exacerbation Secondary bronchitis and secondary sinusitis Antibiotics prescribed warning signs discussed follow-up if problems Use albuterol on a frequent basis Warning signs discussed.

## 2016-10-03 ENCOUNTER — Other Ambulatory Visit: Payer: Self-pay | Admitting: Family Medicine

## 2016-10-07 ENCOUNTER — Other Ambulatory Visit: Payer: Self-pay | Admitting: Family Medicine

## 2016-10-16 ENCOUNTER — Other Ambulatory Visit: Payer: Self-pay | Admitting: Family Medicine

## 2016-10-21 ENCOUNTER — Encounter: Payer: Self-pay | Admitting: Family Medicine

## 2016-10-21 ENCOUNTER — Ambulatory Visit (INDEPENDENT_AMBULATORY_CARE_PROVIDER_SITE_OTHER): Payer: PPO | Admitting: Family Medicine

## 2016-10-21 VITALS — BP 116/70 | Wt 230.2 lb

## 2016-10-21 DIAGNOSIS — E1165 Type 2 diabetes mellitus with hyperglycemia: Secondary | ICD-10-CM

## 2016-10-21 DIAGNOSIS — IMO0001 Reserved for inherently not codable concepts without codable children: Secondary | ICD-10-CM

## 2016-10-21 LAB — POCT GLYCOSYLATED HEMOGLOBIN (HGB A1C): Hemoglobin A1C: 8.8

## 2016-10-21 MED ORDER — LINAGLIPTIN 5 MG PO TABS: 5.0000 mg | ORAL_TABLET | Freq: Every day | ORAL | 5 refills | Status: DC

## 2016-10-21 NOTE — Patient Instructions (Signed)
Diabetes Mellitus and Food It is important for you to manage your blood sugar (glucose) level. Your blood glucose level can be greatly affected by what you eat. Eating healthier foods in the appropriate amounts throughout the day at about the same time each day will help you control your blood glucose level. It can also help slow or prevent worsening of your diabetes mellitus. Healthy eating may even help you improve the level of your blood pressure and reach or maintain a healthy weight. General recommendations for healthful eating and cooking habits include:  Eating meals and snacks regularly. Avoid going long periods of time without eating to lose weight.  Eating a diet that consists mainly of plant-based foods, such as fruits, vegetables, nuts, legumes, and whole grains.  Using low-heat cooking methods, such as baking, instead of high-heat cooking methods, such as deep frying.  Work with your dietitian to make sure you understand how to use the Nutrition Facts information on food labels. How can food affect me? Carbohydrates Carbohydrates affect your blood glucose level more than any other type of food. Your dietitian will help you determine how many carbohydrates to eat at each meal and teach you how to count carbohydrates. Counting carbohydrates is important to keep your blood glucose at a healthy level, especially if you are using insulin or taking certain medicines for diabetes mellitus. Alcohol Alcohol can cause sudden decreases in blood glucose (hypoglycemia), especially if you use insulin or take certain medicines for diabetes mellitus. Hypoglycemia can be a life-threatening condition. Symptoms of hypoglycemia (sleepiness, dizziness, and disorientation) are similar to symptoms of having too much alcohol. If your health care provider has given you approval to drink alcohol, do so in moderation and use the following guidelines:  Women should not have more than one drink per day, and men  should not have more than two drinks per day. One drink is equal to: ? 12 oz of beer. ? 5 oz of wine. ? 1 oz of hard liquor.  Do not drink on an empty stomach.  Keep yourself hydrated. Have water, diet soda, or unsweetened iced tea.  Regular soda, juice, and other mixers might contain a lot of carbohydrates and should be counted.  What foods are not recommended? As you make food choices, it is important to remember that all foods are not the same. Some foods have fewer nutrients per serving than other foods, even though they might have the same number of calories or carbohydrates. It is difficult to get your body what it needs when you eat foods with fewer nutrients. Examples of foods that you should avoid that are high in calories and carbohydrates but low in nutrients include:  Trans fats (most processed foods list trans fats on the Nutrition Facts label).  Regular soda.  Juice.  Candy.  Sweets, such as cake, pie, doughnuts, and cookies.  Fried foods.  What foods can I eat? Eat nutrient-rich foods, which will nourish your body and keep you healthy. The food you should eat also will depend on several factors, including:  The calories you need.  The medicines you take.  Your weight.  Your blood glucose level.  Your blood pressure level.  Your cholesterol level.  You should eat a variety of foods, including:  Protein. ? Lean cuts of meat. ? Proteins low in saturated fats, such as fish, egg whites, and beans. Avoid processed meats.  Fruits and vegetables. ? Fruits and vegetables that may help control blood glucose levels, such as apples,   mangoes, and yams.  Dairy products. ? Choose fat-free or low-fat dairy products, such as milk, yogurt, and cheese.  Grains, bread, pasta, and rice. ? Choose whole grain products, such as multigrain bread, whole oats, and brown rice. These foods may help control blood pressure.  Fats. ? Foods containing healthful fats, such as  nuts, avocado, olive oil, canola oil, and fish.  Does everyone with diabetes mellitus have the same meal plan? Because every person with diabetes mellitus is different, there is not one meal plan that works for everyone. It is very important that you meet with a dietitian who will help you create a meal plan that is just right for you. This information is not intended to replace advice given to you by your health care provider. Make sure you discuss any questions you have with your health care provider. Document Released: 06/19/2005 Document Revised: 02/28/2016 Document Reviewed: 08/19/2013 Elsevier Interactive Patient Education  2017 [employer]  

## 2016-10-21 NOTE — Progress Notes (Signed)
   Subjective:    Patient ID: Roger Phillips, male    DOB: August 14, 1956, 61 y.o.   MRN: [id number]  HPI  Patient presents to office today for recheck on diabetes. He states he is doing well at this time. The patient states he could do a better job watching his diet. He is taking his glipizide but not using Tradjenta on a regular basis. The patient states that the other medication that was prescribed for him was way too expensive and he is unable to afford it. Patient does not want to go on insulin at this time. He denies any chest tightness pressure pain he does smoke he knows he needs to quit. The patient does state he takes his blood pressure medicine reflux medicine and his cholesterol medicine. Review of Systems Please see above patient denies any chest tightness pressure pain shortness breath swelling in his legs    Objective:   Physical Exam His lungs are clear hearts regular pulse normal BP good bilateral extremities no edema       Assessment & Plan:  Poorly controlled diabetic very important for this patient get a better diet also important for him to stick with his medicines. Difficult case patient does not want to be on advanced treatments. He agrees to follow-up in 3 months to check A1c if it is not dramatically better then the next step would be is to add insulin.

## 2016-10-24 ENCOUNTER — Telehealth: Payer: Self-pay | Admitting: Family Medicine

## 2016-10-24 ENCOUNTER — Ambulatory Visit (HOSPITAL_COMMUNITY)
Admission: RE | Admit: 2016-10-24 | Discharge: 2016-10-24 | Disposition: A | Discharge status: Home / Self Care | Payer: PPO | Source: Ambulatory Visit | Attending: Family Medicine | Admitting: Family Medicine

## 2016-10-24 ENCOUNTER — Ambulatory Visit (INDEPENDENT_AMBULATORY_CARE_PROVIDER_SITE_OTHER): Payer: PPO | Admitting: Family Medicine

## 2016-10-24 DIAGNOSIS — M542 Cervicalgia: Secondary | ICD-10-CM

## 2016-10-24 DIAGNOSIS — Z9889 Other specified postprocedural states: Secondary | ICD-10-CM | POA: Diagnosis not present

## 2016-10-24 MED ORDER — CHLORZOXAZONE 500 MG PO TABS: 500.0000 mg | ORAL_TABLET | Freq: Three times a day (TID) | ORAL | 0 refills | Status: DC | PRN
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
## 2016-10-24 NOTE — Telephone Encounter (Signed)
ERROR

## 2016-11-03 ENCOUNTER — Other Ambulatory Visit: Payer: Self-pay | Admitting: Family Medicine

## 2016-11-14 ENCOUNTER — Ambulatory Visit (INDEPENDENT_AMBULATORY_CARE_PROVIDER_SITE_OTHER): Payer: PPO | Admitting: Family Medicine

## 2016-11-14 ENCOUNTER — Encounter: Payer: Self-pay | Admitting: Family Medicine

## 2016-11-14 VITALS — BP 118/80 | Wt 232.0 lb

## 2016-11-14 DIAGNOSIS — M542 Cervicalgia: Secondary | ICD-10-CM | POA: Diagnosis not present
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
## 2016-11-24 ENCOUNTER — Other Ambulatory Visit: Payer: Self-pay | Admitting: Family Medicine

## 2016-11-25 ENCOUNTER — Encounter: Payer: Self-pay | Admitting: Family Medicine

## 2016-11-26 ENCOUNTER — Other Ambulatory Visit: Payer: Self-pay | Admitting: *Deleted

## 2016-11-26 DIAGNOSIS — N200 Calculus of kidney: Secondary | ICD-10-CM

## 2016-11-26 NOTE — Telephone Encounter (Signed)
See previous message

## 2016-11-26 NOTE — Telephone Encounter (Signed)
Please go ahead and refer patient to [facility] for kidney stones thank you

## 2016-11-27 ENCOUNTER — Telehealth: Payer: Self-pay | Admitting: Family Medicine

## 2016-11-27 NOTE — Telephone Encounter (Signed)
Received a fax from [facility] stating that patient's Ventolin inhaler and Tradjenta was approved through 11/26/2016-10/05/2017.

## 2016-12-01 ENCOUNTER — Other Ambulatory Visit: Payer: Self-pay | Admitting: Family Medicine

## 2016-12-22 ENCOUNTER — Encounter: Payer: Self-pay | Admitting: Family Medicine

## 2017-01-05 ENCOUNTER — Other Ambulatory Visit: Payer: Self-pay | Admitting: Family Medicine

## 2017-01-19 ENCOUNTER — Other Ambulatory Visit: Payer: Self-pay | Admitting: Family Medicine

## 2017-01-20 DIAGNOSIS — E119 Type 2 diabetes mellitus without complications: Secondary | ICD-10-CM | POA: Diagnosis not present

## 2017-01-20 DIAGNOSIS — I10 Essential (primary) hypertension: Secondary | ICD-10-CM | POA: Diagnosis not present

## 2017-01-20 DIAGNOSIS — M109 Gout, unspecified: Secondary | ICD-10-CM | POA: Diagnosis not present

## 2017-01-20 DIAGNOSIS — E784 Other hyperlipidemia: Secondary | ICD-10-CM | POA: Diagnosis not present

## 2017-01-21 LAB — URIC ACID: URIC ACID: 4.3 mg/dL (ref 3.7–8.6)

## 2017-01-21 LAB — HEPATIC FUNCTION PANEL
ALT: 17 IU/L (ref 0–44)
AST: 16 IU/L (ref 0–40)
Albumin: 4.5 g/dL (ref 3.6–4.8)
Alkaline Phosphatase: 77 IU/L (ref 39–117)
BILIRUBIN, DIRECT: 0.11 mg/dL (ref 0.00–0.40)
Bilirubin Total: 0.5 mg/dL (ref 0.0–1.2)
Total Protein: 7.1 g/dL (ref 6.0–8.5)

## 2017-01-21 LAB — LIPID PANEL
CHOL/HDL RATIO: 5.2 ratio — AB (ref 0.0–5.0)
Cholesterol, Total: 193 mg/dL (ref 100–199)
HDL: 37 mg/dL — AB (ref >39)
LDL Calculated: 104 mg/dL — ABNORMAL HIGH (ref 0–99)
TRIGLYCERIDES: 262 mg/dL — AB (ref 0–149)
VLDL CHOLESTEROL CAL: 52 mg/dL — AB (ref 5–40)

## 2017-01-21 LAB — BASIC METABOLIC PANEL
BUN / CREAT RATIO: 21 (ref 10–24)
BUN: 19 mg/dL (ref 8–27)
CHLORIDE: 97 mmol/L (ref 96–106)
CO2: 21 mmol/L (ref 18–29)
Calcium: 9.7 mg/dL (ref 8.6–10.2)
Creatinine, Ser: 0.89 mg/dL (ref 0.76–1.27)
GFR calc Af Amer: 106 mL/min/{1.73_m2} (ref >59)
GFR calc non Af Amer: 92 mL/min/{1.73_m2} (ref >59)
GLUCOSE: 269 mg/dL — AB (ref 65–99)
Potassium: 4.3 mmol/L (ref 3.5–5.2)
SODIUM: 138 mmol/L (ref 134–144)

## 2017-01-21 NOTE — Telephone Encounter (Signed)
May have refill 1

## 2017-01-22 ENCOUNTER — Encounter: Payer: Self-pay | Admitting: Family Medicine

## 2017-01-22 ENCOUNTER — Ambulatory Visit (INDEPENDENT_AMBULATORY_CARE_PROVIDER_SITE_OTHER): Payer: PPO | Admitting: Family Medicine

## 2017-01-22 VITALS — BP 130/82 | Ht 72.0 in | Wt 232.4 lb

## 2017-01-22 DIAGNOSIS — E119 Type 2 diabetes mellitus without complications: Secondary | ICD-10-CM

## 2017-01-22 DIAGNOSIS — I10 Essential (primary) hypertension: Secondary | ICD-10-CM

## 2017-01-22 DIAGNOSIS — E1165 Type 2 diabetes mellitus with hyperglycemia: Secondary | ICD-10-CM

## 2017-01-22 DIAGNOSIS — N200 Calculus of kidney: Secondary | ICD-10-CM | POA: Diagnosis not present

## 2017-01-22 DIAGNOSIS — E784 Other hyperlipidemia: Secondary | ICD-10-CM | POA: Diagnosis not present

## 2017-01-22 DIAGNOSIS — E7849 Other hyperlipidemia: Secondary | ICD-10-CM

## 2017-01-22 DIAGNOSIS — J438 Other emphysema: Secondary | ICD-10-CM | POA: Diagnosis not present

## 2017-01-22 DIAGNOSIS — IMO0001 Reserved for inherently not codable concepts without codable children: Secondary | ICD-10-CM

## 2017-01-22 LAB — POCT GLYCOSYLATED HEMOGLOBIN (HGB A1C): HEMOGLOBIN A1C: 8.7

## 2017-01-22 MED ORDER — METFORMIN HCL 500 MG PO TABS: 500.0000 mg | ORAL_TABLET | Freq: Two times a day (BID) | ORAL | 5 refills | Status: DC

## 2017-01-22 MED ORDER — ALBUTEROL SULFATE HFA 108 (90 BASE) MCG/ACT IN AERS: INHALATION_SPRAY | RESPIRATORY_TRACT | 5 refills | Status: DC

## 2017-01-22 MED ORDER — PRAVASTATIN SODIUM 80 MG PO TABS: 80.0000 mg | ORAL_TABLET | Freq: Every day | ORAL | 1 refills | Status: DC

## 2017-01-22 NOTE — Progress Notes (Signed)
   Subjective:    Patient ID: Roger Phillips, male    DOB: 04/27/1956, 61 y.o.   MRN: [id number]  Diabetes  He presents for his follow-up diabetic visit. He has type 2 diabetes mellitus. Pertinent negatives for hypoglycemia include no confusion. Associated symptoms include polyuria. Pertinent negatives for diabetes include no chest pain, no fatigue, no polydipsia, no polyphagia and no weakness. Risk factors for coronary artery disease include diabetes mellitus, dyslipidemia and hypertension. Current diabetic treatment includes oral agent (dual therapy). He is compliant with treatment all of the time. His weight is stable. He is following a diabetic diet.   Patient still smokes he knows he needs to quit Uses his inhaler He has hard time affording his inhalers patient has no desire to go on insulin Patient states he rarely uses pain medicine Does take his blood pressure medicine Does not watch salt diet Does not do any physical activity or exercise Takes his cholesterol medicine without problem Has kidney stones awaiting appointment with urology   Review of Systems  Constitutional: Negative for activity change, appetite change and fatigue.  HENT: Negative for congestion.   Respiratory: Negative for cough.   Cardiovascular: Negative for chest pain.  Gastrointestinal: Negative for abdominal pain.  Endocrine: Positive for polyuria. Negative for polydipsia and polyphagia.  Neurological: Negative for weakness.  Psychiatric/Behavioral: Negative for confusion.       Objective:   Physical Exam  Constitutional: He appears well-nourished. No distress.  Cardiovascular: Normal rate, regular rhythm and normal heart sounds.   No murmur heard. Pulmonary/Chest: Effort normal and breath sounds normal. No respiratory distress.  Musculoskeletal: He exhibits no edema.  Lymphadenopathy:    He has no cervical adenopathy.  Neurological: He is alert.  Psychiatric: His behavior is normal.  Vitals  reviewed.   25 minutes was spent with the patient. Greater than half the time was spent in discussion and answering questions and counseling regarding the issues that the patient came in for today.       Assessment & Plan:  Kidney stones-had previous referral to urology but he states he never heard from urology Diabetes subpar control patient agrees to reinitiate metformin in the past he stated it caused diarrhea We will recheck A1c again in 3-4 months If it's not doing better he will need to start insulin Blood pressure fairly decent control watch diet stay physically active Smoking patient counseled to quit Hyperlipidemia continue medication watch diet. Increase cholesterol medicine to 80 mg Follow-up in 4 months to check A1c

## 2017-01-29 ENCOUNTER — Encounter: Payer: Self-pay | Admitting: Family Medicine

## 2017-02-02 ENCOUNTER — Other Ambulatory Visit: Payer: Self-pay | Admitting: Family Medicine

## 2017-02-03 ENCOUNTER — Other Ambulatory Visit: Payer: Self-pay | Admitting: Family Medicine

## 2017-02-23 ENCOUNTER — Other Ambulatory Visit: Payer: Self-pay | Admitting: Family Medicine

## 2017-03-03 ENCOUNTER — Other Ambulatory Visit: Payer: Self-pay | Admitting: Family Medicine

## 2017-03-04 NOTE — Telephone Encounter (Signed)
May refill each of these 6

## 2017-03-04 NOTE — Telephone Encounter (Signed)
Patient last seen 01/22/17

## 2017-04-14 ENCOUNTER — Encounter: Payer: Self-pay | Admitting: Family Medicine

## 2017-04-14 ENCOUNTER — Ambulatory Visit (INDEPENDENT_AMBULATORY_CARE_PROVIDER_SITE_OTHER): Payer: PPO | Admitting: Family Medicine

## 2017-04-14 VITALS — Ht 72.0 in | Wt 232.6 lb

## 2017-04-14 DIAGNOSIS — E119 Type 2 diabetes mellitus without complications: Secondary | ICD-10-CM | POA: Diagnosis not present

## 2017-04-14 DIAGNOSIS — B379 Candidiasis, unspecified: Secondary | ICD-10-CM | POA: Diagnosis not present

## 2017-04-14 DIAGNOSIS — Z412 Encounter for routine and ritual male circumcision: Secondary | ICD-10-CM

## 2017-04-14 MED ORDER — FLUCONAZOLE 200 MG PO TABS: 200.0000 mg | ORAL_TABLET | Freq: Every day | ORAL | 1 refills | Status: DC

## 2017-04-14 MED ORDER — KETOCONAZOLE 2 % EX CREA: 1.0000 "application " | TOPICAL_CREAM | Freq: Two times a day (BID) | CUTANEOUS | 4 refills | Status: DC

## 2017-04-14 NOTE — Progress Notes (Signed)
   Subjective:    Patient ID: Roger Phillips, male    DOB: 10/25/1955, 61 y.o.   MRN: [id number]  HPI Patient arrives with c/o swollen and irritated area around the tip of his penis for a few weeks. Patient has tried monistat and vaginal cream bu it has not helped the issue.  Relates pain discomfort swelling at the end of the penis denies any injury sweats chills fevers or other problems. Review of Systems     Objective:   Physical Exam Yeast-related infection on the penile shaft and the head of the penis causing pain discomfort Abdomen legs normal   Patient would like referral to urology for possibility of circumcision he states he has reoccurring trouble with yeast infections I told him that they would not do circumcision unless his A1c of is under good control     Assessment & Plan:  Yeast infection Diflucan as directed follow-up if progressive troubles  Poorly controlled diabetes very important get this under control if unable to get this under control next step will be adding insulin patient will follow-up in several weeks check A1c at that visit

## 2017-04-15 NOTE — Addendum Note (Signed)
Addended by: Ofilia Neas R on: 04/15/2017 10:29 AM   Modules accepted: Orders

## 2017-04-15 NOTE — Progress Notes (Signed)
Referral for [facility] ordered in epic

## 2017-04-22 ENCOUNTER — Encounter: Payer: Self-pay | Admitting: Family Medicine

## 2017-05-04 ENCOUNTER — Other Ambulatory Visit: Payer: Self-pay | Admitting: Family Medicine

## 2017-05-08 ENCOUNTER — Ambulatory Visit (INDEPENDENT_AMBULATORY_CARE_PROVIDER_SITE_OTHER): Payer: PPO | Admitting: Family Medicine

## 2017-05-08 ENCOUNTER — Encounter: Payer: Self-pay | Admitting: Family Medicine

## 2017-05-08 VITALS — BP 124/76 | Ht 72.0 in | Wt 233.1 lb

## 2017-05-08 DIAGNOSIS — E1165 Type 2 diabetes mellitus with hyperglycemia: Secondary | ICD-10-CM

## 2017-05-08 DIAGNOSIS — E7849 Other hyperlipidemia: Secondary | ICD-10-CM

## 2017-05-08 DIAGNOSIS — Z122 Encounter for screening for malignant neoplasm of respiratory organs: Secondary | ICD-10-CM

## 2017-05-08 DIAGNOSIS — I10 Essential (primary) hypertension: Secondary | ICD-10-CM

## 2017-05-08 DIAGNOSIS — J438 Other emphysema: Secondary | ICD-10-CM

## 2017-05-08 DIAGNOSIS — Z125 Encounter for screening for malignant neoplasm of prostate: Secondary | ICD-10-CM

## 2017-05-08 DIAGNOSIS — E784 Other hyperlipidemia: Secondary | ICD-10-CM | POA: Diagnosis not present

## 2017-05-08 DIAGNOSIS — Z720 Tobacco use: Secondary | ICD-10-CM | POA: Diagnosis not present

## 2017-05-08 LAB — POCT GLYCOSYLATED HEMOGLOBIN (HGB A1C): Hemoglobin A1C: 9.9

## 2017-05-08 MED ORDER — FLUTICASONE-SALMETEROL 500-50 MCG/DOSE IN AEPB: INHALATION_SPRAY | RESPIRATORY_TRACT | 5 refills | Status: DC

## 2017-05-08 MED ORDER — AMLODIPINE BESYLATE 5 MG PO TABS
ORAL_TABLET | ORAL | 3 refills | Status: DC
Start: 2017-05-08 — End: 2018-05-06

## 2017-05-08 MED ORDER — OMEPRAZOLE 20 MG PO CPDR: DELAYED_RELEASE_CAPSULE | ORAL | 3 refills | Status: DC

## 2017-05-08 MED ORDER — INSULIN GLARGINE 100 UNIT/ML SOLOSTAR PEN: PEN_INJECTOR | SUBCUTANEOUS | 5 refills | Status: DC

## 2017-05-08 NOTE — Progress Notes (Signed)
   Subjective:    Patient ID: Roger Phillips, male    DOB: 1956/03/18, 61 y.o.   MRN: [id number]  Diabetes  He has type 2 diabetes mellitus. Pertinent negatives for hypoglycemia include no headaches. Pertinent negatives for diabetes include no chest pain and no fatigue. He is following a generally unhealthy diet. He does not see a podiatrist.Eye exam is current.   Needs reflux medication dosage increase. Rx for insulin. No other concerns. Results for orders placed or performed in visit on 05/08/17  POCT glycosylated hemoglobin (Hb A1C)  Result Value Ref Range   Hemoglobin A1C 9.9    This patient does not follow a healthy diet Does not exercise He smokes despite being told he should He does have COPD Does uses inhalers Has hyperlipidemia takes his medicine Patient denies rectal bleeding vomiting diarrhea does relate back pain discomfort down the right leg patient has had CT scan in the past which showed a pulmonary nodule which was benign a couple years ago he has not had anything since  Counseling was done today regarding CT scan patient is open to screening for lung cancer. He is aware that this scan can help that that lung cancer but could also potentially show benign issues he is also aware that it could result in over testing he is aware of the radiation in addition to this he has a greater than 30-pack-year history he does still smoke he has no interest in quitting smoking he states he will try to cut back he denies hemoptysis denies unexplained weight loss does not have any history lung cancer he is willing to undergo biopsies or surgery if necessary he has had a face-to-face shared visit today  Review of Systems  Constitutional: Negative for activity change, fatigue and fever.  Respiratory: Negative for cough and shortness of breath.   Cardiovascular: Negative for chest pain and leg swelling.  Neurological: Negative for headaches.       Objective:   Physical Exam    Constitutional: He appears well-nourished. No distress.  Cardiovascular: Normal rate, regular rhythm and normal heart sounds.   No murmur heard. Pulmonary/Chest: Effort normal and breath sounds normal. No respiratory distress.  Musculoskeletal: He exhibits no edema.  Lymphadenopathy:    He has no cervical adenopathy.  Neurological: He is alert.  Psychiatric: His behavior is normal.  Vitals reviewed.     25 minutes was spent with the patient. Greater than half the time was spent in discussion and answering questions and counseling regarding the issues that the patient came in for today.     Assessment & Plan:  Low dose CT scan Poorly controlled diabetes Patient will do long-acting insulin on an evening basis-Lantus 24 units gradually titrate up as necessary to get his numbers better  Hyperlipidemia previous labs reviewed continue current medication  COPD quit smoking unlikely he'll do so hopefully her Back continue inhalers  Patient is interested seen urology for further evaluation he has an appointment in September for possible circumcision it would be best for his A1c to be better controlled  Patient due PSA

## 2017-05-08 NOTE — Patient Instructions (Addendum)
Diabetes Mellitus and Food It is important for you to manage your blood sugar (glucose) level. Your blood glucose level can be greatly affected by what you eat. Eating healthier foods in the appropriate amounts throughout the day at about the same time each day will help you control your blood glucose level. It can also help slow or prevent worsening of your diabetes mellitus. Healthy eating may even help you improve the level of your blood pressure and reach or maintain a healthy weight. General recommendations for healthful eating and cooking habits include:  Eating meals and snacks regularly. Avoid going long periods of time without eating to lose weight.  Eating a diet that consists mainly of plant-based foods, such as fruits, vegetables, nuts, legumes, and whole grains.  Using low-heat cooking methods, such as baking, instead of high-heat cooking methods, such as deep frying.  Work with your dietitian to make sure you understand how to use the Nutrition Facts information on food labels. How can food affect me? Carbohydrates Carbohydrates affect your blood glucose level more than any other type of food. Your dietitian will help you determine how many carbohydrates to eat at each meal and teach you how to count carbohydrates. Counting carbohydrates is important to keep your blood glucose at a healthy level, especially if you are using insulin or taking certain medicines for diabetes mellitus. Alcohol Alcohol can cause sudden decreases in blood glucose (hypoglycemia), especially if you use insulin or take certain medicines for diabetes mellitus. Hypoglycemia can be a life-threatening condition. Symptoms of hypoglycemia (sleepiness, dizziness, and disorientation) are similar to symptoms of having too much alcohol. If your health care provider has given you approval to drink alcohol, do so in moderation and use the following guidelines:  Women should not have more than one drink per day, and men  should not have more than two drinks per day. One drink is equal to: ? 12 oz of beer. ? 5 oz of wine. ? 1 oz of hard liquor.  Do not drink on an empty stomach.  Keep yourself hydrated. Have water, diet soda, or unsweetened iced tea.  Regular soda, juice, and other mixers might contain a lot of carbohydrates and should be counted.  What foods are not recommended? As you make food choices, it is important to remember that all foods are not the same. Some foods have fewer nutrients per serving than other foods, even though they might have the same number of calories or carbohydrates. It is difficult to get your body what it needs when you eat foods with fewer nutrients. Examples of foods that you should avoid that are high in calories and carbohydrates but low in nutrients include:  Trans fats (most processed foods list trans fats on the Nutrition Facts label).  Regular soda.  Juice.  Candy.  Sweets, such as cake, pie, doughnuts, and cookies.  Fried foods.  What foods can I eat? Eat nutrient-rich foods, which will nourish your body and keep you healthy. The food you should eat also will depend on several factors, including:  The calories you need.  The medicines you take.  Your weight.  Your blood glucose level.  Your blood pressure level.  Your cholesterol level.  You should eat a variety of foods, including:  Protein. ? Lean cuts of meat. ? Proteins low in saturated fats, such as fish, egg whites, and beans. Avoid processed meats.  Fruits and vegetables. ? Fruits and vegetables that may help control blood glucose levels, such as apples,   mangoes, and yams.  Dairy products. ? Choose fat-free or low-fat dairy products, such as milk, yogurt, and cheese.  Grains, bread, pasta, and rice. ? Choose whole grain products, such as multigrain bread, whole oats, and brown rice. These foods may help control blood pressure.  Fats. ? Foods containing healthful fats, such as  nuts, avocado, olive oil, canola oil, and fish.  Does everyone with diabetes mellitus have the same meal plan? Because every person with diabetes mellitus is different, there is not one meal plan that works for everyone. It is very important that you meet with a dietitian who will help you create a meal plan that is just right for you. This information is not intended to replace advice given to you by your health care provider. Make sure you discuss any questions you have with your health care provider. Document Released: 06/19/2005 Document Revised: 02/28/2016 Document Reviewed: 08/19/2013 Elsevier Interactive Patient Education  2017 [employer].    Back Exercises If you have pain in your back, do these exercises 2-3 times each day or as told by your doctor. When the pain goes away, do the exercises once each day, but repeat the steps more times for each exercise (do more repetitions). If you do not have pain in your back, do these exercises once each day or as told by your doctor. Exercises Single Knee to Chest  Do these steps 3-5 times in a row for each leg: 1. Lie on your back on a firm bed or the floor with your legs stretched out. 2. Bring one knee to your chest. 3. Hold your knee to your chest by grabbing your knee or thigh. 4. Pull on your knee until you feel a gentle stretch in your lower back. 5. Keep doing the stretch for 10-30 seconds. 6. Slowly let go of your leg and straighten it.  Pelvic Tilt  Do these steps 5-10 times in a row: 1. Lie on your back on a firm bed or the floor with your legs stretched out. 2. Bend your knees so they point up to the ceiling. Your feet should be flat on the floor. 3. Tighten your lower belly (abdomen) muscles to press your lower back against the floor. This will make your tailbone point up to the ceiling instead of pointing down to your feet or the floor. 4. Stay in this position for 5-10 seconds while you gently tighten your muscles and  breathe evenly.  Cat-Cow  Do these steps until your lower back bends more easily: 1. Get on your hands and knees on a firm surface. Keep your hands under your shoulders, and keep your knees under your hips. You may put padding under your knees. 2. Let your head hang down, and make your tailbone point down to the floor so your lower back is round like the back of a cat. 3. Stay in this position for 5 seconds. 4. Slowly lift your head and make your tailbone point up to the ceiling so your back hangs low (sags) like the back of a cow. 5. Stay in this position for 5 seconds.  Press-Ups  Do these steps 5-10 times in a row: 1. Lie on your belly (face-down) on the floor. 2. Place your hands near your head, about shoulder-width apart. 3. While you keep your back relaxed and keep your hips on the floor, slowly straighten your arms to raise the top half of your body and lift your shoulders. Do not use your back muscles. To  make yourself more comfortable, you may change where you place your hands. 4. Stay in this position for 5 seconds. 5. Slowly return to lying flat on the floor.  Bridges  Do these steps 10 times in a row: 1. Lie on your back on a firm surface. 2. Bend your knees so they point up to the ceiling. Your feet should be flat on the floor. 3. Tighten your butt muscles and lift your butt off of the floor until your waist is almost as high as your knees. If you do not feel the muscles working in your butt and the back of your thighs, slide your feet 1-2 inches farther away from your butt. 4. Stay in this position for 3-5 seconds. 5. Slowly lower your butt to the floor, and let your butt muscles relax.  If this exercise is too easy, try doing it with your arms crossed over your chest. Belly Crunches  Do these steps 5-10 times in a row: 1. Lie on your back on a firm bed or the floor with your legs stretched out. 2. Bend your knees so they point up to the ceiling. Your feet should be  flat on the floor. 3. Cross your arms over your chest. 4. Tip your chin a little bit toward your chest but do not bend your neck. 5. Tighten your belly muscles and slowly raise your chest just enough to lift your shoulder blades a tiny bit off of the floor. 6. Slowly lower your chest and your head to the floor.  Back Lifts Do these steps 5-10 times in a row: 1. Lie on your belly (face-down) with your arms at your sides, and rest your forehead on the floor. 2. Tighten the muscles in your legs and your butt. 3. Slowly lift your chest off of the floor while you keep your hips on the floor. Keep the back of your head in line with the curve in your back. Look at the floor while you do this. 4. Stay in this position for 3-5 seconds. 5. Slowly lower your chest and your face to the floor.  Contact a doctor if:  Your back pain gets a lot worse when you do an exercise.  Your back pain does not lessen 2 hours after you exercise. If you have any of these problems, stop doing the exercises. Do not do them again unless your doctor says it is okay. Get help right away if:  You have sudden, very bad back pain. If this happens, stop doing the exercises. Do not do them again unless your doctor says it is okay. This information is not intended to replace advice given to you by your health care provider. Make sure you discuss any questions you have with your health care provider. Document Released: 10/25/2010 Document Revised: 02/28/2016 Document Reviewed: 11/16/2014 Elsevier Interactive Patient Education  [employer].

## 2017-05-12 ENCOUNTER — Other Ambulatory Visit: Payer: Self-pay | Admitting: *Deleted

## 2017-05-12 MED ORDER — INSULIN GLARGINE 100 UNIT/ML SOLOSTAR PEN: PEN_INJECTOR | SUBCUTANEOUS | 5 refills | Status: DC

## 2017-05-13 ENCOUNTER — Other Ambulatory Visit: Payer: Self-pay | Admitting: *Deleted

## 2017-05-13 MED ORDER — GLIPIZIDE 10 MG PO TABS: ORAL_TABLET | ORAL | 1 refills | Status: DC

## 2017-05-13 MED ORDER — LOSARTAN POTASSIUM 50 MG PO TABS: ORAL_TABLET | ORAL | 1 refills | Status: DC

## 2017-05-18 ENCOUNTER — Ambulatory Visit: Payer: PPO | Admitting: Family Medicine

## 2017-05-26 ENCOUNTER — Other Ambulatory Visit: Payer: Self-pay | Admitting: Family Medicine

## 2017-05-26 ENCOUNTER — Other Ambulatory Visit: Payer: Self-pay | Admitting: Acute Care

## 2017-05-26 DIAGNOSIS — Z122 Encounter for screening for malignant neoplasm of respiratory organs: Secondary | ICD-10-CM

## 2017-05-26 DIAGNOSIS — F17210 Nicotine dependence, cigarettes, uncomplicated: Secondary | ICD-10-CM

## 2017-06-12 ENCOUNTER — Ambulatory Visit (INDEPENDENT_AMBULATORY_CARE_PROVIDER_SITE_OTHER): Payer: PPO | Admitting: Acute Care

## 2017-06-12 ENCOUNTER — Encounter: Payer: Commercial Managed Care - HMO | Admitting: Acute Care

## 2017-06-12 ENCOUNTER — Ambulatory Visit: Payer: Commercial Managed Care - HMO

## 2017-06-12 ENCOUNTER — Encounter: Payer: Self-pay | Admitting: Acute Care

## 2017-06-12 ENCOUNTER — Ambulatory Visit (INDEPENDENT_AMBULATORY_CARE_PROVIDER_SITE_OTHER)
Admission: RE | Admit: 2017-06-12 | Discharge: 2017-06-12 | Disposition: A | Discharge status: Home / Self Care | Payer: PPO | Source: Ambulatory Visit | Attending: Acute Care | Admitting: Acute Care

## 2017-06-12 DIAGNOSIS — Z87891 Personal history of nicotine dependence: Secondary | ICD-10-CM | POA: Diagnosis not present

## 2017-06-12 DIAGNOSIS — F17210 Nicotine dependence, cigarettes, uncomplicated: Secondary | ICD-10-CM

## 2017-06-12 DIAGNOSIS — Z122 Encounter for screening for malignant neoplasm of respiratory organs: Secondary | ICD-10-CM

## 2017-06-12 NOTE — Progress Notes (Signed)
Shared Decision Making Visit Lung Cancer Screening Program [phone])   Eligibility:  Age 61 y.o.  Pack Years Smoking History Calculation 60 pack year smoking history (# packs/per year x # years smoked)  Recent History of coughing up blood  no  Unexplained weight loss? no ( >Than 15 pounds within the last 6 months )  Prior History Lung / other cancer no (Diagnosis within the last 5 years already requiring surveillance chest CT Scans).  Smoking Status Current Smoker  Former Smokers: Years since quit: NA  Quit Date: NA  Visit Components:  Discussion included one or more decision making aids. yes  Discussion included risk/benefits of screening. yes  Discussion included potential follow up diagnostic testing for abnormal scans. yes  Discussion included meaning and risk of over diagnosis. yes  Discussion included meaning and risk of False Positives. yes  Discussion included meaning of total radiation exposure. yes  Counseling Included:  Importance of adherence to annual lung cancer LDCT screening. yes  Impact of comorbidities on ability to participate in the program. yes  Ability and willingness to under diagnostic treatment. yes  Smoking Cessation Counseling:  Current Smokers:   Discussed importance of smoking cessation. yes  Information about tobacco cessation classes and interventions provided to patient. yes  Patient provided with "ticket" for LDCT Scan. yes  Symptomatic Patient. no  Counseling  Diagnosis Code: Tobacco Use Z72.0  Asymptomatic Patient yes  Counseling (Intermediate counseling: > three minutes counseling) [id number]  Former Smokers:   Discussed the importance of maintaining cigarette abstinence. yes  Diagnosis Code: Personal History of Nicotine Dependence. G99.242  Information about tobacco cessation classes and interventions provided to patient. Yes  Patient provided with "ticket" for LDCT Scan. yes  Written Order for Lung Cancer  Screening with LDCT placed in Epic. Yes (CT Chest Lung Cancer Screening Low Dose W/O CM) [id number] Z12.2-Screening of respiratory organs Z87.891-Personal history of nicotine dependence  I have spent 25 minutes of face to face time with Mr. Scales discussing the risks and benefits of lung cancer screening. We viewed a power point together that explained in detail the above noted topics. We paused at intervals to allow for questions to be asked and answered to ensure understanding.We discussed that the single most powerful action that he can take to decrease his risk of developing lung cancer is to quit smoking. We discussed whether or not he is ready to commit to setting a quit date. Pt. Is currently not ready to set a quit date.We discussed options for tools to aid in quitting smoking including nicotine replacement therapy, non-nicotine medications, support groups, Quit Smart classes, and behavior modification. We discussed that often times setting smaller, more achievable goals, such as eliminating 1 cigarette a day for a week and then 2 cigarettes a day for a week can be helpful in slowly decreasing the number of cigarettes smoked. This allows for a sense of accomplishment as well as providing a clinical benefit. I gave Mr. Carras the " Be Stronger Than Your Excuses" card with contact information for community resources, classes, free nicotine replacement therapy, and access to mobile apps, text messaging, and on-line smoking cessation help. I have also given him  my card and contact information in the event he needs to contact me. We discussed the time and location of the scan, and that either Doroteo Glassman RN or I will call with the results within 24-48 hours of receiving them. I have offered him  a copy of the power point we viewed  as a resource in the event they need reinforcement of the concepts we discussed today in the office. The patient verbalized understanding of all of  the above and had no  further questions upon leaving the office. They have my contact information in the event they have any further questions.  I spent 4 minutes counseling on smoking cessation and the health risks of continued tobacco abuse.  I explained to the patient that there has been a high incidence of coronary artery disease noted on these exams. I explained that this is a non-gated exam therefore degree or severity cannot be determined. This patient is currently on statin therapy. I have asked the patient to follow-up with their PCP regarding any incidental finding of coronary artery disease and management with diet or medication as their PCP  feels is clinically indicated. The patient verbalized understanding of the above and had no further questions upon completion of the visit.      Magdalen Spatz, NP 06/12/2017

## 2017-06-18 ENCOUNTER — Other Ambulatory Visit: Payer: Self-pay | Admitting: Acute Care

## 2017-06-18 DIAGNOSIS — Z122 Encounter for screening for malignant neoplasm of respiratory organs: Secondary | ICD-10-CM

## 2017-06-18 DIAGNOSIS — E785 Hyperlipidemia, unspecified: Secondary | ICD-10-CM

## 2017-06-18 DIAGNOSIS — E119 Type 2 diabetes mellitus without complications: Secondary | ICD-10-CM

## 2017-06-18 DIAGNOSIS — F17210 Nicotine dependence, cigarettes, uncomplicated: Principal | ICD-10-CM

## 2017-06-18 NOTE — Progress Notes (Signed)
This patient's CAT scan overall negative for any tumor but does show calcification in coronary artery. It is important for the patient quit smoking. Stick with his medications. Eat healthy. Getting diabetes under control. Keep follow-up visit in November. Before his office visit to do lipid, met 7, hemoglobin A1c through the lab

## 2017-06-19 NOTE — Addendum Note (Signed)
Addended by: Dairl Ponder on: 06/19/2017 12:06 PM   Modules accepted: Orders

## 2017-06-19 NOTE — Progress Notes (Signed)
Blood work ordered in EPIC. Patient notified. 

## 2017-06-23 ENCOUNTER — Ambulatory Visit: Payer: PPO | Admitting: Urology

## 2017-07-01 ENCOUNTER — Ambulatory Visit (INDEPENDENT_AMBULATORY_CARE_PROVIDER_SITE_OTHER): Payer: PPO | Admitting: *Deleted

## 2017-07-01 DIAGNOSIS — Z23 Encounter for immunization: Secondary | ICD-10-CM

## 2017-08-03 ENCOUNTER — Other Ambulatory Visit: Payer: Self-pay | Admitting: Family Medicine

## 2017-09-08 ENCOUNTER — Ambulatory Visit: Payer: PPO | Admitting: Family Medicine

## 2017-09-08 ENCOUNTER — Encounter: Payer: Self-pay | Admitting: Family Medicine

## 2017-09-08 VITALS — BP 124/76 | Ht 72.0 in | Wt 235.0 lb

## 2017-09-08 DIAGNOSIS — Z114 Encounter for screening for human immunodeficiency virus [HIV]: Secondary | ICD-10-CM | POA: Diagnosis not present

## 2017-09-08 DIAGNOSIS — I10 Essential (primary) hypertension: Secondary | ICD-10-CM

## 2017-09-08 DIAGNOSIS — Z125 Encounter for screening for malignant neoplasm of prostate: Secondary | ICD-10-CM

## 2017-09-08 DIAGNOSIS — E119 Type 2 diabetes mellitus without complications: Secondary | ICD-10-CM

## 2017-09-08 DIAGNOSIS — Z1159 Encounter for screening for other viral diseases: Secondary | ICD-10-CM | POA: Diagnosis not present

## 2017-09-08 DIAGNOSIS — J438 Other emphysema: Secondary | ICD-10-CM

## 2017-09-08 DIAGNOSIS — Z79899 Other long term (current) drug therapy: Secondary | ICD-10-CM

## 2017-09-08 DIAGNOSIS — M545 Low back pain, unspecified: Secondary | ICD-10-CM

## 2017-09-08 DIAGNOSIS — E785 Hyperlipidemia, unspecified: Secondary | ICD-10-CM | POA: Diagnosis not present

## 2017-09-08 LAB — POCT GLYCOSYLATED HEMOGLOBIN (HGB A1C): HEMOGLOBIN A1C: 8.2

## 2017-09-08 MED ORDER — HYDROCODONE-ACETAMINOPHEN 10-325 MG PO TABS: 1.0000 | ORAL_TABLET | ORAL | 0 refills | Status: DC | PRN

## 2017-09-08 MED ORDER — PRAVASTATIN SODIUM 80 MG PO TABS: 80.0000 mg | ORAL_TABLET | Freq: Every day | ORAL | 1 refills | Status: DC

## 2017-09-08 MED ORDER — GLIPIZIDE 10 MG PO TABS: ORAL_TABLET | ORAL | 1 refills | Status: DC

## 2017-09-08 MED ORDER — LOSARTAN POTASSIUM 50 MG PO TABS: ORAL_TABLET | ORAL | 1 refills | Status: DC

## 2017-09-08 NOTE — Progress Notes (Signed)
   Subjective:    Patient ID: Roger Phillips, male    DOB: October 31, 1955, 61 y.o.   MRN: [id number]  Diabetes  He presents for his follow-up diabetic visit. He has type 2 diabetes mellitus. Pertinent negatives for hypoglycemia include no confusion. Pertinent negatives for diabetes include no chest pain, no fatigue, no polydipsia, no polyphagia and no weakness. Compliance with diabetes treatment: has been out of lantus for about one week. Home blood sugar record trend: has not been checking  blood sugar. He does not see a podiatrist.Eye exam is not current.   Results for orders placed or performed in visit on 09/08/17  POCT glycosylated hemoglobin (Hb A1C)  Result Value Ref Range   Hemoglobin A1C 8.2    Pt states no concerns today.   Review of Systems  Constitutional: Negative for activity change, appetite change and fatigue.  HENT: Negative for congestion.   Respiratory: Negative for cough.   Cardiovascular: Negative for chest pain.  Gastrointestinal: Negative for abdominal pain.  Endocrine: Negative for polydipsia and polyphagia.  Neurological: Negative for weakness.  Psychiatric/Behavioral: Negative for confusion.       Objective:   Physical Exam  Constitutional: He appears well-nourished. No distress.  Cardiovascular: Normal rate, regular rhythm and normal heart sounds.  No murmur heard. Pulmonary/Chest: Effort normal and breath sounds normal. No respiratory distress.  Musculoskeletal: He exhibits no edema.  Lymphadenopathy:    He has no cervical adenopathy.  Neurological: He is alert.  Psychiatric: His behavior is normal.  Vitals reviewed.         Assessment & Plan:  Diabetes subpar control patient does not always use his insulin I encouraged him to check his glucoses in the morning so he can properly adjust his insulin and send [place] some readings ideally needs morning sugars to be between 100 140 currently they are in the low 200s  Blood pressure good control continue  current measures watch salt  Emphysema continue inhalers encourage patient to quit smoking this is unlikely  Lumbar pain with right leg sciatica he is seeing Dr. Sherwood Gambler before referral back to them.  This is been going on for several months may need injections  Hyperlipidemia he needs to do his lab work continue current measures previous labs reviewed  Chronic right leg sciatica may use hydrocodone when necessary for pain not for frequent use cautioned drowsiness  I reminded the patient that he does need to get ophthalmology exam this year

## 2017-09-16 ENCOUNTER — Telehealth: Payer: Self-pay | Admitting: Family Medicine

## 2017-09-16 NOTE — Telephone Encounter (Signed)
Patient will need a MRI or CT of the lumbar spine to send with referral to Dr. Sherwood Gambler  There are no imaging reports for lumbar pain in chart and this is new problem (pt was seen by Dr. Sherwood Gambler in the past for cervical pain back in 2013)  Please advise

## 2017-09-18 NOTE — Telephone Encounter (Signed)
That is true in most cases however this patient has [employer] Because we would order scan at a [facility] facility, getting prior authorization is pretty simple And as of 10/06/17 - [employer] will no longer require prior auth for MRI  Please advise

## 2017-09-18 NOTE — Telephone Encounter (Signed)
Please explained that situation to the [facility].  Unfortunately from a insurance point of view they will not allow for an MRI unless he has been under our care for at least 8 weeks from the time he complained of the back pain and leg discomfort which was documented on the last visit to a follow-up visit. (Unfortunately it does not matter that he has had back pain and sciatica for months the insurance company will just look at when was it documented and it has to be at least 8 weeks of exercise and other medications before they will pay for MRI) therefore for now I recommend he does exercises and pain medication then to follow-up in approximately 8 weeks if still going on at that time we will order MRI-certainly they are free to call Dr. Donnella Bi office to see if they will see him without the MRI but I doubt that they will

## 2017-09-19 NOTE — Telephone Encounter (Signed)
I recommend MRI lumbar spine for low back pain and right leg sciatica.  I would wait until January to initiate this to avoid having to go through preauthorization.-Please explained the situation to the family, in January have the nurses ordered the test, then can proceed with neurosurgery referral to his specialist's thank you

## 2017-09-23 NOTE — Telephone Encounter (Signed)
[facility] at the home #  Cell# - call wouldn't go through

## 2017-09-30 ENCOUNTER — Telehealth: Payer: Self-pay | Admitting: *Deleted

## 2017-09-30 NOTE — Telephone Encounter (Signed)
Pt returning call from last week. Last note in system is from brendale. brendale out of the office today. Pt notiifed. She will return tomorrow and give her a call back.  Best number to call [phone]

## 2017-10-09 NOTE — Telephone Encounter (Signed)
[facility] - see note below, need to order & schedule lumbar MRI so results can be sent with neurosurgery referral   Insurance was verified, Pt still has [employer] which no longer requires prior auth for MRI    [facility] at the home #  Cell# - call wouldn't go through   Kathyrn Drown, MD  You 2 weeks ago   I recommend MRI lumbar spine for low back pain and right leg sciatica.  I would wait until January to initiate this to avoid having to go through preauthorization.-Please explained the situation to the family, in January have the nurses ordered the test, then can proceed with neurosurgery referral to his specialist's thank you

## 2017-10-15 ENCOUNTER — Other Ambulatory Visit: Payer: Self-pay | Admitting: Family Medicine

## 2017-10-16 NOTE — Telephone Encounter (Signed)
This +3 refills 

## 2017-11-16 ENCOUNTER — Encounter: Payer: Self-pay | Admitting: Family Medicine

## 2017-11-23 ENCOUNTER — Other Ambulatory Visit: Payer: Self-pay | Admitting: *Deleted

## 2017-11-23 ENCOUNTER — Telehealth: Payer: Self-pay | Admitting: Family Medicine

## 2017-11-23 DIAGNOSIS — G8911 Acute pain due to trauma: Secondary | ICD-10-CM

## 2017-11-23 NOTE — Telephone Encounter (Signed)
Order put in for MRI lumbar without contrast

## 2017-11-23 NOTE — Telephone Encounter (Signed)
Pt finally returned call  Please go ahead & order MRI for lumbar pain (prior auth NOT required)  Pt needs MRI so that results can be sent with referral to neurosurgery (referral for neurosurgery is already in [place])

## 2017-11-23 NOTE — Telephone Encounter (Signed)
Scan scheduled & pt aware

## 2017-11-24 ENCOUNTER — Other Ambulatory Visit: Payer: Self-pay | Admitting: Family Medicine

## 2017-11-26 ENCOUNTER — Ambulatory Visit (HOSPITAL_COMMUNITY)
Admission: RE | Admit: 2017-11-26 | Discharge: 2017-11-26 | Disposition: A | Discharge status: Home / Self Care | Payer: PPO | Source: Ambulatory Visit | Attending: Family Medicine | Admitting: Family Medicine

## 2017-11-26 ENCOUNTER — Other Ambulatory Visit: Payer: Self-pay | Admitting: Family Medicine

## 2017-11-26 DIAGNOSIS — H5710 Ocular pain, unspecified eye: Secondary | ICD-10-CM

## 2017-11-26 DIAGNOSIS — G8911 Acute pain due to trauma: Secondary | ICD-10-CM

## 2017-11-26 DIAGNOSIS — M48061 Spinal stenosis, lumbar region without neurogenic claudication: Secondary | ICD-10-CM | POA: Insufficient documentation

## 2017-11-26 DIAGNOSIS — Z135 Encounter for screening for eye and ear disorders: Secondary | ICD-10-CM | POA: Diagnosis not present

## 2017-11-26 NOTE — Progress Notes (Unsigned)
obit

## 2017-11-27 ENCOUNTER — Encounter: Payer: Self-pay | Admitting: Family Medicine

## 2017-11-30 ENCOUNTER — Encounter: Payer: Self-pay | Admitting: Family Medicine

## 2017-11-30 DIAGNOSIS — M5416 Radiculopathy, lumbar region: Secondary | ICD-10-CM | POA: Diagnosis not present

## 2017-11-30 DIAGNOSIS — M546 Pain in thoracic spine: Secondary | ICD-10-CM | POA: Diagnosis not present

## 2017-11-30 DIAGNOSIS — M47816 Spondylosis without myelopathy or radiculopathy, lumbar region: Secondary | ICD-10-CM | POA: Diagnosis not present

## 2017-11-30 DIAGNOSIS — M48062 Spinal stenosis, lumbar region with neurogenic claudication: Secondary | ICD-10-CM | POA: Diagnosis not present

## 2017-11-30 DIAGNOSIS — M5136 Other intervertebral disc degeneration, lumbar region: Secondary | ICD-10-CM | POA: Diagnosis not present

## 2017-11-30 DIAGNOSIS — M549 Dorsalgia, unspecified: Secondary | ICD-10-CM | POA: Diagnosis not present

## 2018-01-07 ENCOUNTER — Encounter: Payer: Self-pay | Admitting: Family Medicine

## 2018-01-07 ENCOUNTER — Ambulatory Visit (INDEPENDENT_AMBULATORY_CARE_PROVIDER_SITE_OTHER): Payer: PPO | Admitting: Family Medicine

## 2018-01-07 VITALS — BP 120/76 | Ht 72.0 in | Wt 229.0 lb

## 2018-01-07 DIAGNOSIS — E785 Hyperlipidemia, unspecified: Secondary | ICD-10-CM

## 2018-01-07 DIAGNOSIS — M545 Low back pain, unspecified: Secondary | ICD-10-CM

## 2018-01-07 DIAGNOSIS — J438 Other emphysema: Secondary | ICD-10-CM | POA: Diagnosis not present

## 2018-01-07 DIAGNOSIS — E119 Type 2 diabetes mellitus without complications: Secondary | ICD-10-CM | POA: Diagnosis not present

## 2018-01-07 DIAGNOSIS — I10 Essential (primary) hypertension: Secondary | ICD-10-CM

## 2018-01-07 LAB — POCT GLYCOSYLATED HEMOGLOBIN (HGB A1C): Hemoglobin A1C: 9.4

## 2018-01-07 NOTE — Progress Notes (Signed)
Subjective:    Patient ID: Roger Phillips, male    DOB: December 01, 1955, 62 y.o.   MRN: [id number]  HPI  Patient is here today to follow up on Dm He states he eats healthy and takes lantus 28 u QHS and Glipiized 10 mg one half BID. He does not exercise. See a Dr.Nudleman for his back, and will see him tomorrow. This patient is very nice but he is noncompliant with medications and with quitting smoking.  He is aware that this does increase his risk of heart attacks and strokes  COPD is stable obviously it would be best for him to quit smoking uses albuterol on a as needed basis no ER visits uses his inhalers  The patient was seen today as part of a comprehensive diabetic check up.The patient relates medication compliance. No significant side effects to the medications. Denies any low glucose spells. Relates compliance with diet to a reasonable level. Patient does do labwork intermittently and understands the dangers of diabetes.  Subpar control of his diabetes does not use his medication as directed A1c is higher than what it should be does not follow dietary measures denies low sugar spells  Patient for blood pressure check up. Patient relates compliance with meds. Todays BP reviewed with the patient. Patient denies issues with medication. Patient relates reasonable diet. Patient tries to minimize salt. Patient aware of BP goals.  Patient does have ongoing trouble with reflux.  Takes medication on a regular basis.  Tries to minimize foods as best they can.  They understand the importance of dietary compliance.  May also try to avoid eating a large meal close to bedtime.  Patient denies any dysphagia denies hematochezia.  States medicine does a good job keeping the problem under good control.  Without the medication may certainly have issues.They desire to continue taking their medication.  Chronic lumbar pain but does not want to be on any type of pain medicines he does try to do some stretching  exercises occasionally Review of Systems  Constitutional: Negative for activity change, appetite change and fatigue.  HENT: Negative for congestion and rhinorrhea.   Respiratory: Negative for cough, chest tightness and shortness of breath.   Cardiovascular: Negative for chest pain and leg swelling.  Gastrointestinal: Negative for abdominal pain, diarrhea and nausea.  Endocrine: Negative for polydipsia and polyphagia.  Genitourinary: Negative for dysuria and hematuria.  Neurological: Negative for weakness and headaches.  Psychiatric/Behavioral: Negative for confusion and dysphoric mood.   Results for orders placed or performed in visit on 01/07/18  POCT glycosylated hemoglobin (Hb A1C)  Result Value Ref Range   Hemoglobin A1C 9.4        Objective:   Physical Exam  Constitutional: He appears well-nourished. No distress.  HENT:  Head: Normocephalic and atraumatic.  Eyes: Right eye exhibits no discharge. Left eye exhibits no discharge.  Neck: No tracheal deviation present.  Cardiovascular: Normal rate, regular rhythm and normal heart sounds.  No murmur heard. Pulmonary/Chest: Effort normal and breath sounds normal. No respiratory distress. He has no wheezes.  Musculoskeletal: He exhibits no edema.  Lymphadenopathy:    He has no cervical adenopathy.  Neurological: He is alert.  Skin: Skin is warm. No rash noted.  Psychiatric: His behavior is normal.  Vitals reviewed.    25 minutes was spent with the patient.  This statement verifies that 25 minutes was indeed spent with the patient. Greater than half the time was spent in discussion, counseling and answering questions  regarding the issues that the patient came in for today as reflected in the diagnosis (s) please refer to documentation for further details.  Recheck patient in 4 months    Assessment & Plan:  Diabetes subpar control encourage patient to do better with diet exercise  COPD continue inhalers encourage patient to  quit smoking unlikely to do so  Blood pressure decent control continue current medications explained to him that losartan does not cause cancer  Low back pain stretching exercises recommended Tylenol as needed patient does not want to be on pain medicine  Hyperlipidemia encouraged patient to restart medication watch diet closely  Encourage patient do a better job with getting his labs done.

## 2018-01-07 NOTE — Patient Instructions (Signed)
Please do your labs in April

## 2018-01-08 DIAGNOSIS — M5416 Radiculopathy, lumbar region: Secondary | ICD-10-CM | POA: Diagnosis not present

## 2018-01-08 DIAGNOSIS — M47816 Spondylosis without myelopathy or radiculopathy, lumbar region: Secondary | ICD-10-CM | POA: Diagnosis not present

## 2018-01-08 DIAGNOSIS — M5136 Other intervertebral disc degeneration, lumbar region: Secondary | ICD-10-CM | POA: Diagnosis not present

## 2018-01-08 DIAGNOSIS — M48062 Spinal stenosis, lumbar region with neurogenic claudication: Secondary | ICD-10-CM | POA: Diagnosis not present

## 2018-02-04 ENCOUNTER — Encounter: Payer: Self-pay | Admitting: Family Medicine

## 2018-02-04 ENCOUNTER — Telehealth: Payer: Self-pay | Admitting: Family Medicine

## 2018-02-04 ENCOUNTER — Encounter: Payer: Self-pay | Admitting: *Deleted

## 2018-02-04 ENCOUNTER — Other Ambulatory Visit: Payer: Self-pay | Admitting: Family Medicine

## 2018-02-04 DIAGNOSIS — M4726 Other spondylosis with radiculopathy, lumbar region: Secondary | ICD-10-CM | POA: Diagnosis not present

## 2018-02-04 DIAGNOSIS — M48061 Spinal stenosis, lumbar region without neurogenic claudication: Secondary | ICD-10-CM | POA: Diagnosis not present

## 2018-02-04 DIAGNOSIS — M5136 Other intervertebral disc degeneration, lumbar region: Secondary | ICD-10-CM | POA: Diagnosis not present

## 2018-02-04 MED ORDER — FLUCONAZOLE 150 MG PO TABS: 150.0000 mg | ORAL_TABLET | Freq: Once | ORAL | 4 refills | Status: AC

## 2018-02-04 NOTE — Telephone Encounter (Signed)
done

## 2018-02-04 NOTE — Telephone Encounter (Signed)
Family sent a my chart request for Diflucan this was sent into [facility] please send them my chart response letting them know that it was sent in

## 2018-02-06 ENCOUNTER — Other Ambulatory Visit: Payer: Self-pay | Admitting: Family Medicine

## 2018-03-06 ENCOUNTER — Other Ambulatory Visit: Payer: Self-pay | Admitting: Family Medicine

## 2018-05-05 ENCOUNTER — Other Ambulatory Visit: Payer: Self-pay | Admitting: Family Medicine

## 2018-05-06 ENCOUNTER — Ambulatory Visit (INDEPENDENT_AMBULATORY_CARE_PROVIDER_SITE_OTHER): Payer: PPO | Admitting: Family Medicine

## 2018-05-06 ENCOUNTER — Encounter: Payer: Self-pay | Admitting: Family Medicine

## 2018-05-06 VITALS — BP 120/78 | Ht 72.0 in | Wt 228.1 lb

## 2018-05-06 DIAGNOSIS — Z79899 Other long term (current) drug therapy: Secondary | ICD-10-CM

## 2018-05-06 DIAGNOSIS — J438 Other emphysema: Secondary | ICD-10-CM | POA: Diagnosis not present

## 2018-05-06 DIAGNOSIS — I10 Essential (primary) hypertension: Secondary | ICD-10-CM

## 2018-05-06 DIAGNOSIS — Z122 Encounter for screening for malignant neoplasm of respiratory organs: Secondary | ICD-10-CM | POA: Diagnosis not present

## 2018-05-06 DIAGNOSIS — E1165 Type 2 diabetes mellitus with hyperglycemia: Secondary | ICD-10-CM

## 2018-05-06 DIAGNOSIS — R972 Elevated prostate specific antigen [PSA]: Secondary | ICD-10-CM | POA: Diagnosis not present

## 2018-05-06 DIAGNOSIS — E785 Hyperlipidemia, unspecified: Secondary | ICD-10-CM | POA: Diagnosis not present

## 2018-05-06 DIAGNOSIS — E119 Type 2 diabetes mellitus without complications: Secondary | ICD-10-CM | POA: Diagnosis not present

## 2018-05-06 DIAGNOSIS — Z720 Tobacco use: Secondary | ICD-10-CM

## 2018-05-06 DIAGNOSIS — Z125 Encounter for screening for malignant neoplasm of prostate: Secondary | ICD-10-CM | POA: Diagnosis not present

## 2018-05-06 LAB — POCT GLYCOSYLATED HEMOGLOBIN (HGB A1C): HEMOGLOBIN A1C: 8.1 % — AB (ref 4.0–5.6)

## 2018-05-06 MED ORDER — PRAVASTATIN SODIUM 80 MG PO TABS
80.0000 mg | ORAL_TABLET | Freq: Every day | ORAL | 1 refills | Status: DC
Start: 2018-05-06 — End: 2018-10-26

## 2018-05-06 MED ORDER — FLUTICASONE-SALMETEROL 500-50 MCG/DOSE IN AEPB: INHALATION_SPRAY | RESPIRATORY_TRACT | 5 refills | Status: DC

## 2018-05-06 MED ORDER — OMEPRAZOLE 20 MG PO CPDR: DELAYED_RELEASE_CAPSULE | ORAL | 3 refills | Status: DC

## 2018-05-06 MED ORDER — HYDROCHLOROTHIAZIDE 25 MG PO TABS: ORAL_TABLET | ORAL | 1 refills | Status: DC

## 2018-05-06 MED ORDER — KETOCONAZOLE 2 % EX CREA: 1.0000 "application " | TOPICAL_CREAM | Freq: Two times a day (BID) | CUTANEOUS | 4 refills | Status: DC

## 2018-05-06 MED ORDER — LOSARTAN POTASSIUM 50 MG PO TABS: ORAL_TABLET | ORAL | 1 refills | Status: DC

## 2018-05-06 MED ORDER — GLIPIZIDE 10 MG PO TABS: ORAL_TABLET | ORAL | 1 refills | Status: DC

## 2018-05-06 MED ORDER — AMLODIPINE BESYLATE 5 MG PO TABS: ORAL_TABLET | ORAL | 3 refills | Status: DC

## 2018-05-06 MED ORDER — FLUCONAZOLE 200 MG PO TABS: 200.0000 mg | ORAL_TABLET | Freq: Every day | ORAL | 1 refills | Status: DC

## 2018-05-06 NOTE — Progress Notes (Signed)
Subjective:    Patient ID: Roger Phillips, male    DOB: [birth year], 62 y.o.   MRN: [id number]  HPI Patient is here today to follow up on Dm. Needs refills on Ketoconazole cream, lancets and sildenafil [facility]. He states he eats fairly well. He gets some exercise. He sees a neurologist for his back.  Patient here for follow-up regarding cholesterol.  The patient does have hyperlipidemia.  Patient does try to maintain a reasonable diet.  Patient does take the medication on a regular basis.  Denies missing a dose.  The patient denies any obvious side effects.  Prior blood work results reviewed with the patient.  The patient is aware of his cholesterol goals and the need to keep it under good control to lessen the risk of disease.  Patient does use tobacco products.  Patient knows they should quit.  Patient is aware that smoking/in use of tobacco products increases their risk of heart disease, cancer, and COPD- lung issues.  Patient has been counseled to quit smoking/tobacco products.  Patient for blood pressure check up.  The patient does have hypertension.  The patient is on medication.  Patient relates compliance with meds. Todays BP reviewed with the patient. Patient denies issues with medication. Patient relates reasonable diet. Patient tries to minimize salt. Patient aware of BP goals.  Patient counseled on taking his medications eating healthier stay more active and quitting smoking Results for orders placed or performed in visit on 05/06/18  POCT glycosylated hemoglobin (Hb A1C)  Result Value Ref Range   Hemoglobin A1C 8.1 (A) 4.0 - 5.6 %   HbA1c POC (<> result, manual entry)  4.0 - 5.6 %   HbA1c, POC (prediabetic range)  5.7 - 6.4 %   HbA1c, POC (controlled diabetic range)  0.0 - 7.0 %     Review of Systems  Constitutional: Negative for activity change, fatigue and fever.  HENT: Negative for congestion and rhinorrhea.   Respiratory: Negative for cough and shortness of  breath.   Cardiovascular: Negative for chest pain and leg swelling.  Gastrointestinal: Negative for abdominal pain, diarrhea and nausea.  Genitourinary: Negative for dysuria and hematuria.  Neurological: Negative for weakness and headaches.  Psychiatric/Behavioral: Negative for agitation and behavioral problems.       Objective:   Physical Exam  Constitutional: He appears well-nourished. No distress.  HENT:  Head: Normocephalic and atraumatic.  Eyes: Right eye exhibits no discharge. Left eye exhibits no discharge.  Neck: No tracheal deviation present.  Cardiovascular: Normal rate, regular rhythm and normal heart sounds.  No murmur heard. Pulmonary/Chest: Effort normal and breath sounds normal. No respiratory distress.  Musculoskeletal: He exhibits no edema.  Lymphadenopathy:    He has no cervical adenopathy.  Neurological: He is alert. Coordination normal.  Skin: Skin is warm and dry.  Psychiatric: He has a normal mood and affect. His behavior is normal.  Vitals reviewed.         Assessment & Plan:  Diabetes subpar control we adjusted the insulin encouraged him to do better with eating healthy  Smoker discussed quitting encouraged him to do so  COPD may use inhaler quit smoking  HTN- Patient was seen today as part of a visit regarding hypertension. The importance of healthy diet and regular physical activity was discussed. The importance of compliance with medications discussed.  Ideal goal is to keep blood pressure low elevated levels certainly below 629/52 when possible.  The patient was counseled that keeping blood pressure under  control lessen his risk of complications.  The importance of regular follow-ups was discussed with the patient.  Low-salt diet such as DASH recommended.  Regular physical activity was recommended as well.  Patient was advised to keep regular follow-ups.  The patient was seen today as part of an evaluation regarding hyperlipidemia.  Recent  lab work has been reviewed with the patient as well as the goals for good cholesterol care.  In addition to this medications have been discussed the importance of compliance with diet and medications discussed as well.  Finally the patient is aware that poor control of cholesterol, noncompliance can dramatically increase the risk of complications. The patient will keep regular office visits and the patient does agreed to periodic lab work.

## 2018-05-07 ENCOUNTER — Other Ambulatory Visit: Payer: Self-pay | Admitting: Family Medicine

## 2018-05-07 LAB — BASIC METABOLIC PANEL
BUN/Creatinine Ratio: 15 (ref 10–24)
BUN: 15 mg/dL (ref 8–27)
CALCIUM: 9.4 mg/dL (ref 8.6–10.2)
CO2: 22 mmol/L (ref 20–29)
Chloride: 93 mmol/L — ABNORMAL LOW (ref 96–106)
Creatinine, Ser: 1 mg/dL (ref 0.76–1.27)
GFR calc Af Amer: 92 mL/min/{1.73_m2} (ref >59)
GFR calc non Af Amer: 80 mL/min/{1.73_m2} (ref >59)
Glucose: 233 mg/dL — ABNORMAL HIGH (ref 65–99)
Potassium: 4 mmol/L (ref 3.5–5.2)
Sodium: 135 mmol/L (ref 134–144)

## 2018-05-07 LAB — HEPATIC FUNCTION PANEL
ALT: 22 IU/L (ref 0–44)
AST: 20 IU/L (ref 0–40)
Albumin: 4.7 g/dL (ref 3.6–4.8)
Alkaline Phosphatase: 69 IU/L (ref 39–117)
BILIRUBIN, DIRECT: 0.16 mg/dL (ref 0.00–0.40)
Bilirubin Total: 0.6 mg/dL (ref 0.0–1.2)
Total Protein: 7.3 g/dL (ref 6.0–8.5)

## 2018-05-07 LAB — LIPID PANEL
CHOLESTEROL TOTAL: 183 mg/dL (ref 100–199)
Chol/HDL Ratio: 5.4 ratio — ABNORMAL HIGH (ref 0.0–5.0)
HDL: 34 mg/dL — AB (ref >39)
LDL Calculated: 86 mg/dL (ref 0–99)
TRIGLYCERIDES: 317 mg/dL — AB (ref 0–149)
VLDL Cholesterol Cal: 63 mg/dL — ABNORMAL HIGH (ref 5–40)

## 2018-05-07 LAB — PSA: PROSTATE SPECIFIC AG, SERUM: 4.9 ng/mL — AB (ref 0.0–4.0)

## 2018-05-07 NOTE — Telephone Encounter (Signed)
6 refills each

## 2018-05-11 NOTE — Addendum Note (Signed)
Addended by: Karle Barr on: 05/11/2018 01:48 PM   Modules accepted: Orders

## 2018-06-29 ENCOUNTER — Ambulatory Visit: Payer: PPO | Admitting: Urology

## 2018-06-29 DIAGNOSIS — N481 Balanitis: Secondary | ICD-10-CM | POA: Diagnosis not present

## 2018-06-29 DIAGNOSIS — R972 Elevated prostate specific antigen [PSA]: Secondary | ICD-10-CM

## 2018-07-05 ENCOUNTER — Encounter: Payer: Self-pay | Admitting: Family Medicine

## 2018-07-05 NOTE — Telephone Encounter (Signed)
Nurses Please send Roger Phillips message letting her know that I have not heard anything yet Please have family connect with the specialist to get the results Also see if the front can send a request for them/specialist to send [place] a copy of the testing

## 2018-07-05 NOTE — Telephone Encounter (Signed)
No results in Epic. Not sure if Dr. Nicki Reaper received a note from specialist. If not I will respond back to pt that dr scott does not have results and to call specialist.

## 2018-07-12 ENCOUNTER — Other Ambulatory Visit: Payer: Self-pay

## 2018-07-12 DIAGNOSIS — Z720 Tobacco use: Secondary | ICD-10-CM

## 2018-07-15 ENCOUNTER — Ambulatory Visit: Payer: PPO

## 2018-07-21 ENCOUNTER — Ambulatory Visit (INDEPENDENT_AMBULATORY_CARE_PROVIDER_SITE_OTHER): Payer: PPO | Admitting: *Deleted

## 2018-07-21 DIAGNOSIS — Z23 Encounter for immunization: Secondary | ICD-10-CM | POA: Diagnosis not present

## 2018-07-28 ENCOUNTER — Ambulatory Visit (HOSPITAL_COMMUNITY): Payer: PPO

## 2018-07-28 ENCOUNTER — Ambulatory Visit (HOSPITAL_COMMUNITY)
Admission: RE | Admit: 2018-07-28 | Discharge: 2018-07-28 | Disposition: A | Discharge status: Home / Self Care | Payer: PPO | Source: Ambulatory Visit | Attending: Acute Care | Admitting: Acute Care

## 2018-07-28 DIAGNOSIS — R918 Other nonspecific abnormal finding of lung field: Secondary | ICD-10-CM | POA: Insufficient documentation

## 2018-07-28 DIAGNOSIS — Z122 Encounter for screening for malignant neoplasm of respiratory organs: Secondary | ICD-10-CM | POA: Insufficient documentation

## 2018-07-28 DIAGNOSIS — Z720 Tobacco use: Secondary | ICD-10-CM

## 2018-07-28 DIAGNOSIS — I2510 Atherosclerotic heart disease of native coronary artery without angina pectoris: Secondary | ICD-10-CM | POA: Insufficient documentation

## 2018-07-28 DIAGNOSIS — I700 Atherosclerosis of aorta: Secondary | ICD-10-CM | POA: Insufficient documentation

## 2018-07-28 DIAGNOSIS — F17210 Nicotine dependence, cigarettes, uncomplicated: Secondary | ICD-10-CM | POA: Insufficient documentation

## 2018-07-28 DIAGNOSIS — J432 Centrilobular emphysema: Secondary | ICD-10-CM | POA: Diagnosis not present

## 2018-07-28 DIAGNOSIS — J438 Other emphysema: Secondary | ICD-10-CM | POA: Insufficient documentation

## 2018-08-02 ENCOUNTER — Other Ambulatory Visit: Payer: Self-pay | Admitting: Acute Care

## 2018-08-02 DIAGNOSIS — Z122 Encounter for screening for malignant neoplasm of respiratory organs: Secondary | ICD-10-CM

## 2018-08-02 DIAGNOSIS — F17210 Nicotine dependence, cigarettes, uncomplicated: Principal | ICD-10-CM

## 2018-08-09 ENCOUNTER — Other Ambulatory Visit: Payer: Self-pay | Admitting: Family Medicine

## 2018-09-10 ENCOUNTER — Other Ambulatory Visit: Payer: Self-pay | Admitting: Family Medicine

## 2018-09-10 ENCOUNTER — Encounter: Payer: Self-pay | Admitting: Family Medicine

## 2018-09-10 MED ORDER — HYDROCODONE-HOMATROPINE 5-1.5 MG/5ML PO SYRP: 5.0000 mL | ORAL_SOLUTION | Freq: Four times a day (QID) | ORAL | 0 refills | Status: AC | PRN

## 2018-10-26 ENCOUNTER — Encounter: Payer: Self-pay | Admitting: Family Medicine

## 2018-10-26 ENCOUNTER — Ambulatory Visit (INDEPENDENT_AMBULATORY_CARE_PROVIDER_SITE_OTHER): Payer: PPO | Admitting: Family Medicine

## 2018-10-26 VITALS — BP 120/76 | Ht 72.0 in | Wt 228.0 lb

## 2018-10-26 DIAGNOSIS — E1165 Type 2 diabetes mellitus with hyperglycemia: Secondary | ICD-10-CM

## 2018-10-26 DIAGNOSIS — I10 Essential (primary) hypertension: Secondary | ICD-10-CM | POA: Diagnosis not present

## 2018-10-26 DIAGNOSIS — J438 Other emphysema: Secondary | ICD-10-CM

## 2018-10-26 DIAGNOSIS — E785 Hyperlipidemia, unspecified: Secondary | ICD-10-CM

## 2018-10-26 LAB — POCT GLYCOSYLATED HEMOGLOBIN (HGB A1C): HEMOGLOBIN A1C: 9.8 % — AB (ref 4.0–5.6)

## 2018-10-26 MED ORDER — LOSARTAN POTASSIUM 50 MG PO TABS: ORAL_TABLET | ORAL | 1 refills | Status: DC

## 2018-10-26 MED ORDER — AMLODIPINE BESYLATE 5 MG PO TABS: ORAL_TABLET | ORAL | 1 refills | Status: DC

## 2018-10-26 MED ORDER — PRAVASTATIN SODIUM 40 MG PO TABS: 40.0000 mg | ORAL_TABLET | Freq: Every day | ORAL | 1 refills | Status: DC

## 2018-10-26 MED ORDER — INSULIN GLARGINE 100 UNIT/ML SOLOSTAR PEN: PEN_INJECTOR | SUBCUTANEOUS | 5 refills | Status: DC

## 2018-10-26 MED ORDER — FLUTICASONE-SALMETEROL 500-50 MCG/DOSE IN AEPB: INHALATION_SPRAY | RESPIRATORY_TRACT | 5 refills | Status: DC

## 2018-10-26 MED ORDER — ALBUTEROL SULFATE HFA 108 (90 BASE) MCG/ACT IN AERS: INHALATION_SPRAY | RESPIRATORY_TRACT | 5 refills | Status: DC

## 2018-10-26 MED ORDER — HYDROCHLOROTHIAZIDE 25 MG PO TABS: ORAL_TABLET | ORAL | 1 refills | Status: DC

## 2018-10-26 MED ORDER — OMEPRAZOLE 20 MG PO CPDR: DELAYED_RELEASE_CAPSULE | ORAL | 3 refills | Status: DC

## 2018-10-26 NOTE — Progress Notes (Signed)
Subjective:    Patient ID: Roger Phillips, male    DOB: [birth year], 63 y.o.   MRN: [id number]  Diabetes  He presents for his follow-up diabetic visit. He has type 2 diabetes mellitus. Pertinent negatives for hypoglycemia include no confusion, dizziness or headaches. Pertinent negatives for diabetes include no chest pain and no fatigue. Compliance with diabetes treatment: misses some of his insulin at night sometimes. Home blood sugar record trend: in the 300's. He does not see a podiatrist.Eye exam is not current (goes in february).   Patient for blood pressure check up.  The patient does have hypertension.  The patient is on medication.  Patient relates compliance with meds. Todays BP reviewed with the patient. Patient denies issues with medication. Patient relates reasonable diet. Patient tries to minimize salt. Patient aware of BP goals.  Patient here for follow-up regarding cholesterol.  The patient does have hyperlipidemia.  Patient does try to maintain a reasonable diet.  Patient does take the medication on a regular basis.  Denies missing a dose.  The patient denies any obvious side effects.  Prior blood work results reviewed with the patient.  The patient is aware of his cholesterol goals and the need to keep it under good control to lessen the risk of disease.   Pt states pravastatin 80mg  was tearing up his belly so he tried 40mg  bid and still had to go back to 40mg  daily.   Results for orders placed or performed in visit on 10/26/18  POCT glycosylated hemoglobin (Hb A1C)  Result Value Ref Range   Hemoglobin A1C 9.8 (A) 4.0 - 5.6 %   HbA1c POC (<> result, manual entry)     HbA1c, POC (prediabetic range)     HbA1c, POC (controlled diabetic range)      Patient has history of COPD he still smokes I counseled him about quitting smoking unlikely he will do so patient does have COPD.  No flareups recently. Using inhalers.   Review of Systems  Constitutional: Negative for  diaphoresis and fatigue.  HENT: Negative for congestion and rhinorrhea.   Respiratory: Negative for cough and shortness of breath.   Cardiovascular: Negative for chest pain and leg swelling.  Gastrointestinal: Negative for abdominal pain and diarrhea.  Skin: Negative for color change and rash.  Neurological: Negative for dizziness and headaches.  Psychiatric/Behavioral: Negative for behavioral problems and confusion.       Objective:   Physical Exam Vitals signs reviewed.  Constitutional:      General: He is not in acute distress. HENT:     Head: Normocephalic and atraumatic.  Eyes:     General:        Right eye: No discharge.        Left eye: No discharge.  Neck:     Trachea: No tracheal deviation.  Cardiovascular:     Rate and Rhythm: Normal rate and regular rhythm.     Heart sounds: Normal heart sounds. No murmur.  Pulmonary:     Effort: Pulmonary effort is normal. No respiratory distress.     Breath sounds: Normal breath sounds.  Lymphadenopathy:     Cervical: No cervical adenopathy.  Skin:    General: Skin is warm and dry.  Neurological:     Mental Status: He is alert.     Coordination: Coordination normal.  Psychiatric:        Behavior: Behavior normal.           Assessment & Plan:  HTN- Patient  was seen today as part of a visit regarding hypertension. The importance of healthy diet and regular physical activity was discussed. The importance of compliance with medications discussed.  Ideal goal is to keep blood pressure low elevated levels certainly below 233/00 when possible.  The patient was counseled that keeping blood pressure under control lessen his risk of complications.  The importance of regular follow-ups was discussed with the patient.  Low-salt diet such as DASH recommended.  Regular physical activity was recommended as well.  Patient was advised to keep regular follow-ups.  Patient does use tobacco products.  Patient knows they should quit.   Patient is aware that smoking/in use of tobacco products increases their risk of heart disease, cancer, and COPD- lung issues.  Patient has been counseled to quit smoking/tobacco products.  The patient was seen today as part of an evaluation regarding hyperlipidemia.  Recent lab work has been reviewed with the patient as well as the goals for good cholesterol care.  In addition to this medications have been discussed the importance of compliance with diet and medications discussed as well.  Finally the patient is aware that poor control of cholesterol, noncompliance can dramatically increase the risk of complications. The patient will keep regular office visits and the patient does agreed to periodic lab work. Patient unable to tolerate pravastatin at 80 mg reduce it down to 40 mg  Diabetes poor control he will check sugars more often he would do a better job of taking his insulin he will send [place] readings regarding this  25 minutes was spent with the patient.  This statement verifies that 25 minutes was indeed spent with the patient.  More than 50% of this visit-total duration of the visit-was spent in counseling and coordination of care. The issues that the patient came in for today as reflected in the diagnosis (s) please refer to documentation for further details.   Patient due for his colonoscopy in July  Patient is due for eye exam he states he will get this in the near future

## 2018-11-05 ENCOUNTER — Telehealth: Payer: Self-pay | Admitting: *Deleted

## 2018-11-05 NOTE — Telephone Encounter (Signed)
Elevated sugar readings

## 2018-11-05 NOTE — Telephone Encounter (Signed)
His glucose readings are running in the 200s in the morning and near 300 later in the day I talked with his wife they are willing to go up on insulin Levemir 35 units nightly NovoLog 15 units with each meal

## 2018-11-07 NOTE — Telephone Encounter (Signed)
Please send glucose readings sheets to the family.  Please asked Roger Phillips when she checks his sugars to list these.  Also please put on the sheet how much insulin they are giving to correlate those with the sugar readings.  Send [place] some more readings in 2 weeks time

## 2018-11-08 NOTE — Telephone Encounter (Signed)
Glucose forms mailed to patient.  Left message to return call to notify patient.

## 2018-11-09 NOTE — Telephone Encounter (Signed)
Wife (DPR) notified and verbalized understanding. 

## 2018-12-13 ENCOUNTER — Other Ambulatory Visit: Payer: Self-pay | Admitting: Family Medicine

## 2018-12-14 NOTE — Telephone Encounter (Signed)
May have 2 refills 

## 2019-02-14 ENCOUNTER — Other Ambulatory Visit: Payer: Self-pay | Admitting: Family Medicine

## 2019-03-08 ENCOUNTER — Other Ambulatory Visit: Payer: Self-pay | Admitting: Family Medicine

## 2019-03-14 ENCOUNTER — Other Ambulatory Visit: Payer: Self-pay | Admitting: Family Medicine

## 2019-04-12 ENCOUNTER — Encounter (INDEPENDENT_AMBULATORY_CARE_PROVIDER_SITE_OTHER): Payer: Self-pay | Admitting: *Deleted

## 2019-04-26 ENCOUNTER — Ambulatory Visit (INDEPENDENT_AMBULATORY_CARE_PROVIDER_SITE_OTHER): Payer: PPO | Admitting: Family Medicine

## 2019-04-26 ENCOUNTER — Other Ambulatory Visit: Payer: Self-pay

## 2019-04-26 VITALS — BP 124/72 | Temp 98.3°F | Ht 72.0 in | Wt 226.4 lb

## 2019-04-26 DIAGNOSIS — J438 Other emphysema: Secondary | ICD-10-CM | POA: Diagnosis not present

## 2019-04-26 DIAGNOSIS — I10 Essential (primary) hypertension: Secondary | ICD-10-CM | POA: Diagnosis not present

## 2019-04-26 DIAGNOSIS — Z125 Encounter for screening for malignant neoplasm of prostate: Secondary | ICD-10-CM | POA: Diagnosis not present

## 2019-04-26 DIAGNOSIS — Z1159 Encounter for screening for other viral diseases: Secondary | ICD-10-CM | POA: Diagnosis not present

## 2019-04-26 DIAGNOSIS — Z0000 Encounter for general adult medical examination without abnormal findings: Secondary | ICD-10-CM | POA: Diagnosis not present

## 2019-04-26 DIAGNOSIS — Z114 Encounter for screening for human immunodeficiency virus [HIV]: Secondary | ICD-10-CM

## 2019-04-26 DIAGNOSIS — E785 Hyperlipidemia, unspecified: Secondary | ICD-10-CM

## 2019-04-26 DIAGNOSIS — E1165 Type 2 diabetes mellitus with hyperglycemia: Secondary | ICD-10-CM | POA: Diagnosis not present

## 2019-04-26 LAB — POCT GLYCOSYLATED HEMOGLOBIN (HGB A1C): Hemoglobin A1C: 8.1 % — AB (ref 4.0–5.6)

## 2019-04-26 MED ORDER — PRAVASTATIN SODIUM 40 MG PO TABS: 40.0000 mg | ORAL_TABLET | Freq: Every day | ORAL | 1 refills | Status: DC

## 2019-04-26 MED ORDER — HYDROCHLOROTHIAZIDE 25 MG PO TABS: ORAL_TABLET | ORAL | 1 refills | Status: DC

## 2019-04-26 MED ORDER — LANTUS SOLOSTAR 100 UNIT/ML ~~LOC~~ SOPN: PEN_INJECTOR | SUBCUTANEOUS | 5 refills | Status: DC

## 2019-04-26 MED ORDER — AMLODIPINE BESYLATE 5 MG PO TABS: ORAL_TABLET | ORAL | 1 refills | Status: DC

## 2019-04-26 MED ORDER — LOSARTAN POTASSIUM 50 MG PO TABS: ORAL_TABLET | ORAL | 1 refills | Status: DC

## 2019-04-26 MED ORDER — ALBUTEROL SULFATE (2.5 MG/3ML) 0.083% IN NEBU: 2.5000 mg | INHALATION_SOLUTION | Freq: Four times a day (QID) | RESPIRATORY_TRACT | 6 refills | Status: DC | PRN

## 2019-04-26 NOTE — Patient Instructions (Addendum)
Results for orders placed or performed in visit on 04/26/19  POCT glycosylated hemoglobin (Hb A1C)  Result Value Ref Range   Hemoglobin A1C 8.1 (A) 4.0 - 5.6 %   HbA1c POC (<> result, manual entry)     HbA1c, POC (prediabetic range)     HbA1c, POC (controlled diabetic range)      Please do your labs  Please use your insulin daily  Your exam overall is good  Please work on quiting smoking  Hopefully it will be safe to do colonoscopy by early 2021 But if you want it done sooner please call  May use OTC generic claritin or generic Allegra for allergies  Recheck in 4 months

## 2019-04-26 NOTE — Progress Notes (Signed)
Subjective:    Patient ID: Roger Phillips, male    DOB: 1955-11-15, 63 y.o.   MRN: [id number]  HPI The patient comes in today for a wellness visit. Patient states he tries to do a good job of healthy choices with eating He still smokesHe has been counseled to quit smoking several times He does drink occasionally but denies excessive drinking  Patient for blood pressure check up.  The patient does have hypertension.  The patient is on medication.  Patient relates compliance with meds. Todays BP reviewed with the patient. Patient denies issues with medication. Patient relates reasonable diet. Patient tries to minimize salt. Patient aware of BP goals.  Patient here for follow-up regarding cholesterol.  The patient does have hyperlipidemia.  Patient does try to maintain a reasonable diet.  Patient does take the medication on a regular basis.  Denies missing a dose.  The patient denies any obvious side effects.  Prior blood work results reviewed with the patient.  The patient is aware of his cholesterol goals and the need to keep it under good control to lessen the risk of disease.  The patient was seen today as part of a comprehensive diabetic check up.the patient does have diabetes.  The patient follows here on a regular basis.  The patient relates medication compliance. No significant side effects to the medications. Denies any low glucose spells. Relates compliance with diet to a reasonable level. Patient does do labwork intermittently and understands the dangers of diabetes.   A review of their health history was completed.  A review of medications was also completed.  Any needed refills; yes  Eating habits: eats 2 meals a day- tries to eat healthy-could do better  Falls/  MVA accidents in past few months: none  Regular exercise: not much  Specialist pt sees on regular basis: none  Preventative health issues were discussed.   Additional concerns: none  Results for orders placed  or performed in visit on 04/26/19  POCT glycosylated hemoglobin (Hb A1C)  Result Value Ref Range   Hemoglobin A1C 8.1 (A) 4.0 - 5.6 %   HbA1c POC (<> result, manual entry)     HbA1c, POC (prediabetic range)     HbA1c, POC (controlled diabetic range)       Review of Systems  Constitutional: Negative for activity change, appetite change and fever.  HENT: Negative for congestion and rhinorrhea.   Eyes: Negative for discharge.  Respiratory: Negative for cough and wheezing.   Cardiovascular: Negative for chest pain.  Gastrointestinal: Negative for abdominal pain, blood in stool and vomiting.  Genitourinary: Negative for difficulty urinating and frequency.  Musculoskeletal: Negative for neck pain.  Skin: Negative for rash.  Allergic/Immunologic: Negative for environmental allergies and food allergies.  Neurological: Negative for weakness and headaches.  Psychiatric/Behavioral: Negative for agitation.       Objective:   Physical Exam Constitutional:      Appearance: He is well-developed.  HENT:     Head: Normocephalic and atraumatic.     Right Ear: External ear normal.     Left Ear: External ear normal.     Nose: Nose normal.  Eyes:     Pupils: Pupils are equal, round, and reactive to light.  Neck:     Musculoskeletal: Normal range of motion and neck supple.     Thyroid: No thyromegaly.  Cardiovascular:     Rate and Rhythm: Normal rate and regular rhythm.     Heart sounds: Normal heart sounds. No  murmur.  Pulmonary:     Effort: Pulmonary effort is normal. No respiratory distress.     Breath sounds: Normal breath sounds. No wheezing.  Abdominal:     General: Bowel sounds are normal. There is no distension.     Palpations: Abdomen is soft. There is no mass.     Tenderness: There is no abdominal tenderness.  Genitourinary:    Penis: Normal.   Musculoskeletal: Normal range of motion.  Lymphadenopathy:     Cervical: No cervical adenopathy.  Skin:    General: Skin is warm  and dry.     Findings: No erythema.  Neurological:     Mental Status: He is alert.     Motor: No abnormal muscle tone.  Psychiatric:        Behavior: Behavior normal.        Judgment: Judgment normal.      Prostate exam normal     Assessment & Plan:  Adult wellness-complete.wellness physical was conducted today. Importance of diet and exercise were discussed in detail.  In addition to this a discussion regarding safety was also covered. We also reviewed over immunizations and gave recommendations regarding current immunization needed for age.  In addition to this additional areas were also touched on including: Preventative health exams needed:  Colonoscopy patient is due for colonoscopy but he does not want to do it currently because of pandemic wants to put off till next year  Patient was advised yearly wellness exam  The patient was seen today as part of a comprehensive visit for diabetes. The importance of keeping her A1c at or below 7 was discussed.  Importance of regular physical activity was discussed.   The importance of adherence to medication as well as a controlled low starch/sugar diet was also discussed.  Standard follow-up visit recommended.  Also patient aware failure to keep diabetes under control increases the risk of complications.  HTN- Patient was seen today as part of a visit regarding hypertension. The importance of healthy diet and regular physical activity was discussed. The importance of compliance with medications discussed.  Ideal goal is to keep blood pressure low elevated levels certainly below 026/37 when possible.  The patient was counseled that keeping blood pressure under control lessen his risk of complications.  The importance of regular follow-ups was discussed with the patient.  Low-salt diet such as DASH recommended.  Regular physical activity was recommended as well.  Patient was advised to keep regular follow-ups.  The patient was seen  today as part of an evaluation regarding hyperlipidemia.  Recent lab work has been reviewed with the patient as well as the goals for good cholesterol care.  In addition to this medications have been discussed the importance of compliance with diet and medications discussed as well.  Finally the patient is aware that poor control of cholesterol, noncompliance can dramatically increase the risk of complications. The patient will keep regular office visits and the patient does agreed to periodic lab work. Patient to do lab work before next visit He does have COPD uses albuterol intermittently he has been counseled strongly to quit smoking  Patient has been counseled to have his eye exam  Follow-up in 4 months

## 2019-05-03 ENCOUNTER — Encounter: Payer: Self-pay | Admitting: Family Medicine

## 2019-05-03 MED ORDER — AMOXICILLIN 500 MG PO CAPS: ORAL_CAPSULE | ORAL | 0 refills | Status: DC

## 2019-05-03 MED ORDER — AZITHROMYCIN 250 MG PO TABS: ORAL_TABLET | ORAL | 0 refills | Status: DC

## 2019-05-03 NOTE — Addendum Note (Signed)
Addended by: Vicente Males on: 05/03/2019 01:14 PM   Modules accepted: Orders

## 2019-05-03 NOTE — Telephone Encounter (Signed)
Please send in a Z-Pak if he has any ongoing troubles he needs to do a virtual visit

## 2019-05-03 NOTE — Telephone Encounter (Signed)
Nurses May be fine to go with amoxicillin 500 mg 1 3 times daily 10 days If he starts having any cough shortness of breath fever chills body aches he will need to go to ER for further evaluation and testing for possible COVID  Also if over the next several days if he starts developing severe congestion sore throat low-grade fevers those type of symptoms I recommend COVID testing

## 2019-05-03 NOTE — Addendum Note (Signed)
Addended by: Vicente Males on: 05/03/2019 11:22 AM   Modules accepted: Orders

## 2019-05-16 ENCOUNTER — Other Ambulatory Visit: Payer: Self-pay | Admitting: Family Medicine

## 2019-07-05 ENCOUNTER — Other Ambulatory Visit: Payer: Self-pay | Admitting: Family Medicine

## 2019-07-18 ENCOUNTER — Other Ambulatory Visit: Payer: Self-pay | Admitting: Family Medicine

## 2019-07-25 ENCOUNTER — Encounter: Payer: Self-pay | Admitting: Family Medicine

## 2019-07-26 ENCOUNTER — Other Ambulatory Visit: Payer: Self-pay | Admitting: Family Medicine

## 2019-07-26 ENCOUNTER — Other Ambulatory Visit: Payer: Self-pay

## 2019-07-26 ENCOUNTER — Other Ambulatory Visit (INDEPENDENT_AMBULATORY_CARE_PROVIDER_SITE_OTHER): Payer: PPO

## 2019-07-26 DIAGNOSIS — Z23 Encounter for immunization: Secondary | ICD-10-CM

## 2019-07-26 MED ORDER — FLUCONAZOLE 200 MG PO TABS: 200.0000 mg | ORAL_TABLET | Freq: Every day | ORAL | 1 refills | Status: DC

## 2019-08-01 ENCOUNTER — Other Ambulatory Visit: Payer: Self-pay | Admitting: Family Medicine

## 2019-08-11 ENCOUNTER — Other Ambulatory Visit: Payer: Self-pay | Admitting: Family Medicine

## 2019-09-16 ENCOUNTER — Other Ambulatory Visit: Payer: Self-pay | Admitting: Family Medicine

## 2019-10-20 ENCOUNTER — Other Ambulatory Visit: Payer: Self-pay | Admitting: Family Medicine

## 2019-11-02 ENCOUNTER — Other Ambulatory Visit: Payer: Self-pay | Admitting: Family Medicine

## 2019-11-14 ENCOUNTER — Other Ambulatory Visit: Payer: Self-pay | Admitting: Family Medicine

## 2019-11-15 NOTE — Telephone Encounter (Signed)
lvm to schedule virtual visit

## 2019-11-15 NOTE — Telephone Encounter (Signed)
Please contact patient to set up appt; then may route back to nurses. Thank you  

## 2019-11-15 NOTE — Telephone Encounter (Signed)
Scheduled 3/1

## 2019-12-05 ENCOUNTER — Encounter: Payer: Self-pay | Admitting: Family Medicine

## 2019-12-05 ENCOUNTER — Other Ambulatory Visit: Payer: Self-pay

## 2019-12-05 ENCOUNTER — Ambulatory Visit (INDEPENDENT_AMBULATORY_CARE_PROVIDER_SITE_OTHER): Payer: PPO | Admitting: Family Medicine

## 2019-12-05 DIAGNOSIS — E1165 Type 2 diabetes mellitus with hyperglycemia: Secondary | ICD-10-CM

## 2019-12-05 DIAGNOSIS — Z125 Encounter for screening for malignant neoplasm of prostate: Secondary | ICD-10-CM | POA: Diagnosis not present

## 2019-12-05 DIAGNOSIS — Z720 Tobacco use: Secondary | ICD-10-CM | POA: Diagnosis not present

## 2019-12-05 DIAGNOSIS — J438 Other emphysema: Secondary | ICD-10-CM

## 2019-12-05 DIAGNOSIS — Z114 Encounter for screening for human immunodeficiency virus [HIV]: Secondary | ICD-10-CM | POA: Diagnosis not present

## 2019-12-05 DIAGNOSIS — Z79899 Other long term (current) drug therapy: Secondary | ICD-10-CM

## 2019-12-05 DIAGNOSIS — Z1159 Encounter for screening for other viral diseases: Secondary | ICD-10-CM | POA: Diagnosis not present

## 2019-12-05 DIAGNOSIS — I10 Essential (primary) hypertension: Secondary | ICD-10-CM

## 2019-12-05 DIAGNOSIS — E785 Hyperlipidemia, unspecified: Secondary | ICD-10-CM

## 2019-12-05 MED ORDER — OMEPRAZOLE 20 MG PO CPDR: DELAYED_RELEASE_CAPSULE | ORAL | 1 refills | Status: DC

## 2019-12-05 MED ORDER — HYDROCHLOROTHIAZIDE 25 MG PO TABS: ORAL_TABLET | ORAL | 1 refills | Status: DC

## 2019-12-05 MED ORDER — AMLODIPINE BESYLATE 5 MG PO TABS: ORAL_TABLET | ORAL | 1 refills | Status: DC

## 2019-12-05 MED ORDER — PRAVASTATIN SODIUM 40 MG PO TABS: 40.0000 mg | ORAL_TABLET | Freq: Every day | ORAL | 1 refills | Status: DC

## 2019-12-05 MED ORDER — LOSARTAN POTASSIUM 50 MG PO TABS: ORAL_TABLET | ORAL | 1 refills | Status: DC

## 2019-12-05 MED ORDER — CETIRIZINE HCL 10 MG PO TABS: 10.0000 mg | ORAL_TABLET | Freq: Every day | ORAL | 1 refills | Status: DC

## 2019-12-05 MED ORDER — GLIPIZIDE 10 MG PO TABS: ORAL_TABLET | ORAL | 1 refills | Status: DC

## 2019-12-05 NOTE — Progress Notes (Signed)
Subjective:    Patient ID: Roger Phillips, male    DOB: 10/22/1955, 64 y.o.   MRN: [id number]  Diabetes He presents for his follow-up diabetic visit. He has type 2 diabetes mellitus. Pertinent negatives for hypoglycemia include no confusion, dizziness or headaches. Pertinent negatives for diabetes include no chest pain and no fatigue. Current diabetic treatments: glipizide, lantus. Compliance with diabetes treatment: does not always take lantus. Home blood sugar record trend: 250's. Eye exam is not current (wife states she will make him one. did not have one last year ).  Screening for prostate cancer - Plan: PSA  Hyperlipidemia, unspecified hyperlipidemia type - Plan: Lipid panel  Hypertension, unspecified type - Plan: Basic metabolic panel  Uncontrolled type 2 diabetes mellitus with hyperglycemia ([name]) - Plan: Microalbumin / creatinine urine ratio, Hemoglobin A1c  Need for hepatitis C screening test - Plan: Hepatitis C antibody  Screening for HIV (human immunodeficiency virus) - Plan: HIV Antibody (routine testing w rflx)  High risk medication use - Plan: Hepatic function panel   Virtual Visit via Telephone Note  I connected with Eyvonne Left [place]tell on 12/05/19 at  2:00 PM EST by telephone and verified that I am speaking with the correct person using two identifiers.  Location: Patient: home Provider: office   I discussed the limitations, risks, security and privacy concerns of performing an evaluation and management service by telephone and the availability of in person appointments. I also discussed with the patient that there may be a patient responsible charge related to this service. The patient expressed understanding and agreed to proceed.   History of Present Illness:    Observations/Objective:   Assessment and Plan:   Follow Up Instructions:    I discussed the assessment and treatment plan with the patient. The patient was provided an opportunity to ask  questions and all were answered. The patient agreed with the plan and demonstrated an understanding of the instructions.   The patient was advised to call back or seek an in-person evaluation if the symptoms worsen or if the condition fails to improve as anticipated.  I provided 20 minutes of non-face-to-face time during this encounter.        Review of Systems  Constitutional: Negative for diaphoresis and fatigue.  HENT: Negative for congestion and rhinorrhea.   Respiratory: Negative for cough and shortness of breath.   Cardiovascular: Negative for chest pain and leg swelling.  Gastrointestinal: Negative for abdominal pain and diarrhea.  Skin: Negative for color change and rash.  Neurological: Negative for dizziness and headaches.  Psychiatric/Behavioral: Negative for behavioral problems and confusion.       Objective:   Physical Exam   Today's visit was via telephone Physical exam was not possible for this visit       Assessment & Plan:  1. Screening for prostate cancer Patient did not do his lab work last year I encouraged him strongly to get it done - PSA  2. Hyperlipidemia, unspecified hyperlipidemia type Significant hyperlipidemia in the past continue medication check lab await results previous lab reviewed - Lipid panel  3. Hypertension, unspecified type Blood pressure reportedly decent control stay away from smoking watch salt take medicine - Basic metabolic panel  4. Uncontrolled type 2 diabetes mellitus with hyperglycemia ([name]) We need to check his A1c patient did not follow through with doing lab work recently he states he will this time continue medication - Microalbum medication check liver profile in / creatinine urine ratio - Hemoglobin A1c  5. Need for hepatitis C screening test Screening test indicated - Hepatitis C antibody  6. Screening for HIV (human immunodeficiency virus) Screening test indicated - HIV Antibody (routine testing w  rflx)  7. High risk medication use Due to medication check liver profile - Hepatic fun is as needed looking use inhaler ction panel  8. Other emphysema ([place]) Quit some quit smoking  9. Tobacco abuse Quit smoking counseling given Patient advised to get Covid vaccine

## 2019-12-19 ENCOUNTER — Encounter: Payer: Self-pay | Admitting: Family Medicine

## 2019-12-20 NOTE — Telephone Encounter (Signed)
I reordered Roger Phillips's [facility] and she states Roger Phillips still wants to do his at [place] and his orders are already put in for [facility].

## 2019-12-20 NOTE — Telephone Encounter (Signed)
Nurses It appears that they are requesting [facility] It looks like his orders from early March were under [facility]  Please reorder these labs and have them do these via [facility]

## 2019-12-21 ENCOUNTER — Encounter: Payer: Self-pay | Admitting: Family Medicine

## 2019-12-23 DIAGNOSIS — E1165 Type 2 diabetes mellitus with hyperglycemia: Secondary | ICD-10-CM | POA: Diagnosis not present

## 2019-12-23 DIAGNOSIS — E785 Hyperlipidemia, unspecified: Secondary | ICD-10-CM | POA: Diagnosis not present

## 2019-12-23 DIAGNOSIS — Z125 Encounter for screening for malignant neoplasm of prostate: Secondary | ICD-10-CM | POA: Diagnosis not present

## 2019-12-23 DIAGNOSIS — Z1159 Encounter for screening for other viral diseases: Secondary | ICD-10-CM | POA: Diagnosis not present

## 2019-12-23 DIAGNOSIS — Z114 Encounter for screening for human immunodeficiency virus [HIV]: Secondary | ICD-10-CM | POA: Diagnosis not present

## 2019-12-23 DIAGNOSIS — Z79899 Other long term (current) drug therapy: Secondary | ICD-10-CM | POA: Diagnosis not present

## 2019-12-23 DIAGNOSIS — I10 Essential (primary) hypertension: Secondary | ICD-10-CM | POA: Diagnosis not present

## 2019-12-24 LAB — HEPATIC FUNCTION PANEL
ALT: 17 IU/L (ref 0–44)
AST: 13 IU/L (ref 0–40)
Albumin: 4.7 g/dL (ref 3.8–4.8)
Alkaline Phosphatase: 91 IU/L (ref 39–117)
Bilirubin Total: 0.6 mg/dL (ref 0.0–1.2)
Bilirubin, Direct: 0.14 mg/dL (ref 0.00–0.40)
Total Protein: 7.1 g/dL (ref 6.0–8.5)

## 2019-12-24 LAB — BASIC METABOLIC PANEL
BUN/Creatinine Ratio: 16 (ref 10–24)
BUN: 17 mg/dL (ref 8–27)
CO2: 25 mmol/L (ref 20–29)
Calcium: 9.5 mg/dL (ref 8.6–10.2)
Chloride: 95 mmol/L — ABNORMAL LOW (ref 96–106)
Creatinine, Ser: 1.08 mg/dL (ref 0.76–1.27)
GFR calc Af Amer: 83 mL/min/{1.73_m2} (ref >59)
GFR calc non Af Amer: 72 mL/min/{1.73_m2} (ref >59)
Glucose: 286 mg/dL — ABNORMAL HIGH (ref 65–99)
Potassium: 4.2 mmol/L (ref 3.5–5.2)
Sodium: 136 mmol/L (ref 134–144)

## 2019-12-24 LAB — LIPID PANEL
Chol/HDL Ratio: 4.8 ratio (ref 0.0–5.0)
Cholesterol, Total: 162 mg/dL (ref 100–199)
HDL: 34 mg/dL — ABNORMAL LOW (ref >39)
LDL Chol Calc (NIH): 91 mg/dL (ref 0–99)
Triglycerides: 220 mg/dL — ABNORMAL HIGH (ref 0–149)
VLDL Cholesterol Cal: 37 mg/dL (ref 5–40)

## 2019-12-24 LAB — PSA: Prostate Specific Ag, Serum: 4.9 ng/mL — ABNORMAL HIGH (ref 0.0–4.0)

## 2019-12-24 LAB — MICROALBUMIN / CREATININE URINE RATIO
Creatinine, Urine: 221.8 mg/dL
Microalb/Creat Ratio: 15 mg/g creat (ref 0–29)
Microalbumin, Urine: 33.6 ug/mL

## 2019-12-24 LAB — HEPATITIS C ANTIBODY: Hep C Virus Ab: <0.1 s/co ratio (ref 0.0–0.9)

## 2019-12-24 LAB — HIV ANTIBODY (ROUTINE TESTING W REFLEX): HIV Screen 4th Generation wRfx: NONREACTIVE

## 2019-12-24 LAB — HEMOGLOBIN A1C
Est. average glucose Bld gHb Est-mCnc: 275 mg/dL
Hgb A1c MFr Bld: 11.2 % — ABNORMAL HIGH (ref 4.8–5.6)

## 2020-01-28 IMAGING — CT CT CHEST LUNG CANCER SCREENING LOW DOSE W/O CM
1 series · 10 of 10 positions shown, 13 images · non-contrast
Comparison: Low-dose lung cancer screening chest CT 06/12/2017.

CLINICAL DATA: 61-year-old male current smoker with 90 pack-year
history of smoking. Lung cancer screening examination.

EXAM:
CT CHEST WITHOUT CONTRAST LOW-DOSE FOR LUNG CANCER SCREENING
TECHNIQUE: Multidetector CT imaging of the chest was performed following the
standard protocol without IV contrast.

[ct lung segmentation data · axial · 0.73mm/px · z∈[-201,-201]mm · 10 of 255 frames shown]
[frame 1/255  mediastinal]
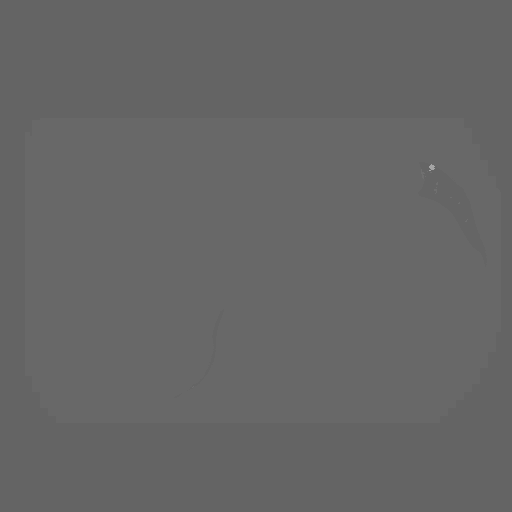
[frame 1/255  lung]
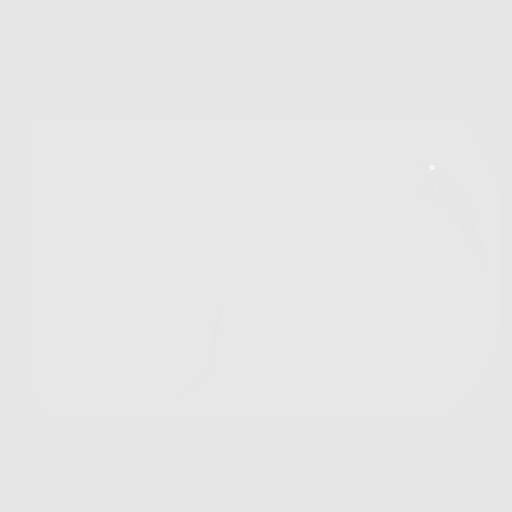
[frame 29/255  lung]
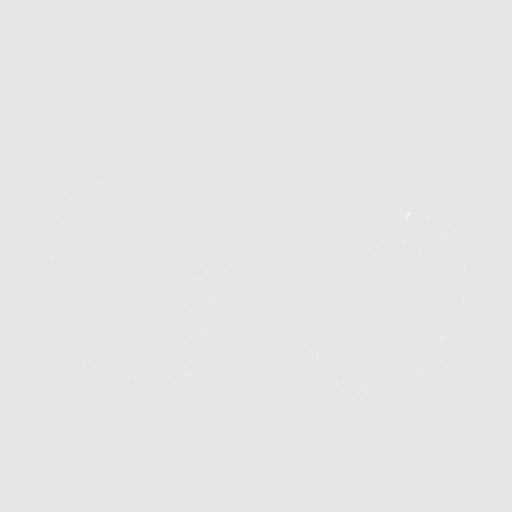
[frame 57/255  lung]
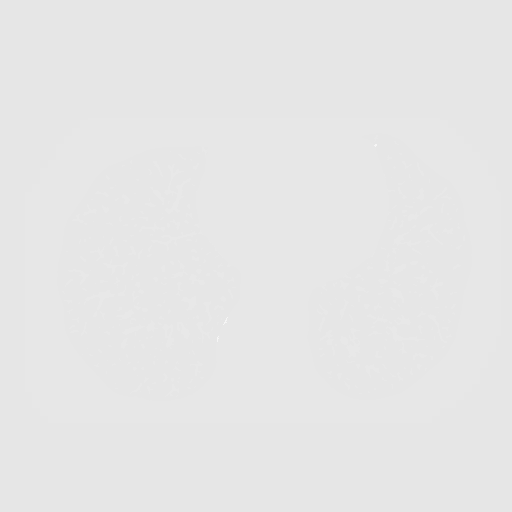
[frame 85/255  lung]
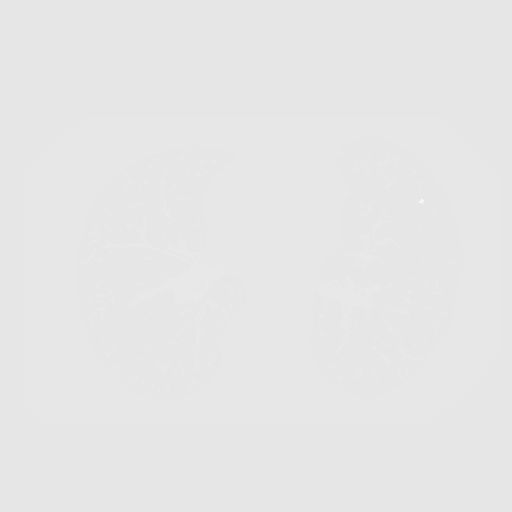
[frame 113/255  mediastinal]
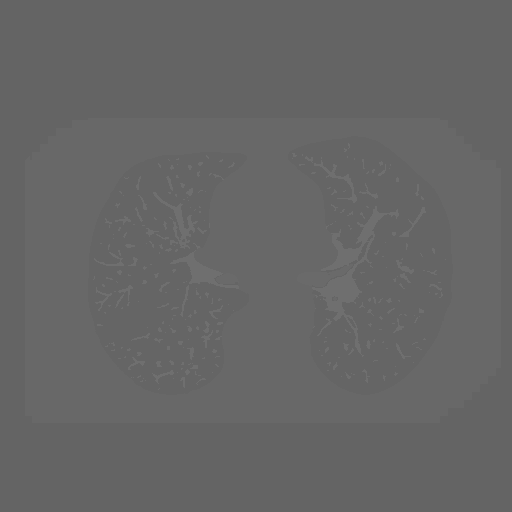
[frame 113/255  lung]
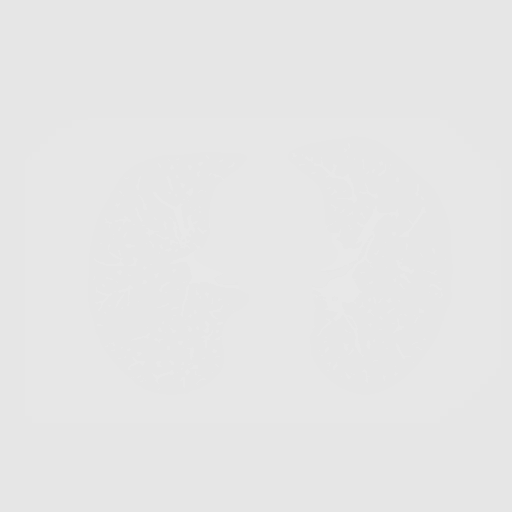
[frame 142/255  lung]
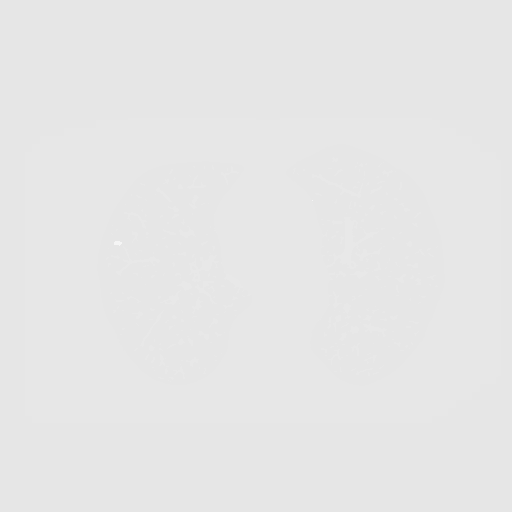
[frame 170/255  lung]
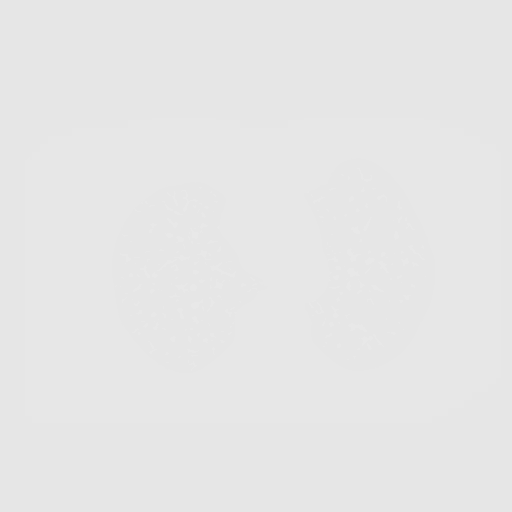
[frame 198/255  lung]
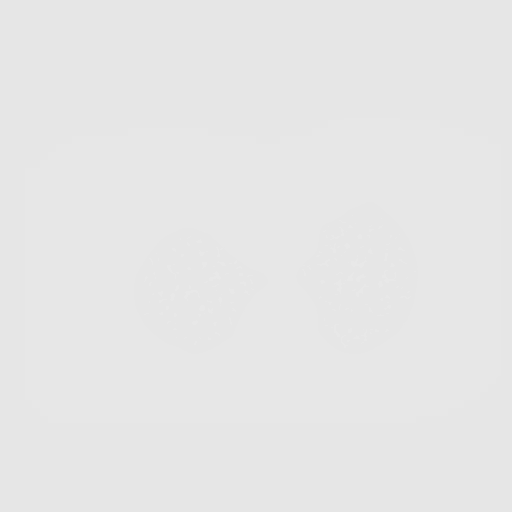
[frame 226/255  mediastinal]
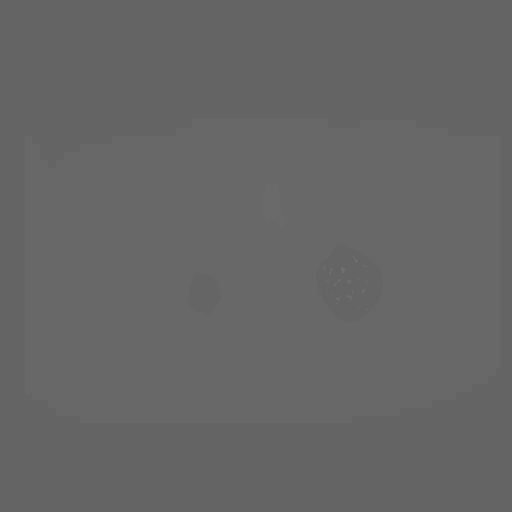
[frame 226/255  lung]
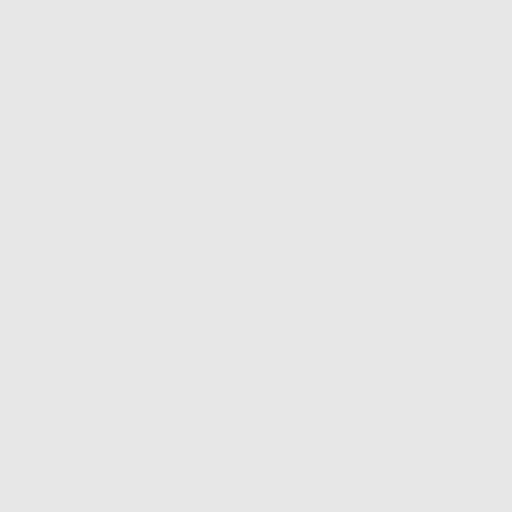
[frame 255/255  lung]
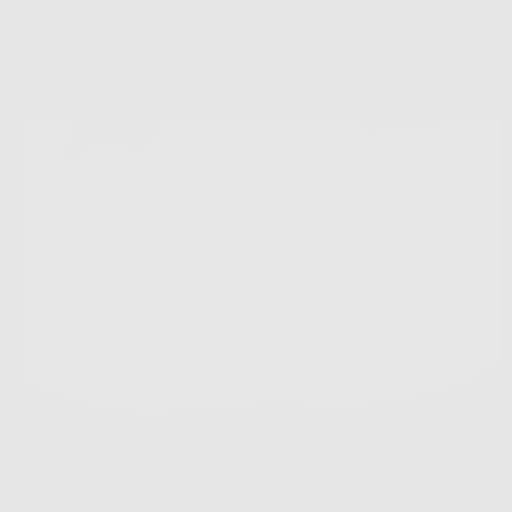

[10 of 10 positions shown; findings below may reference images not displayed]

FINDINGS: Cardiovascular: Heart size is normal. There is no significant
pericardial fluid, thickening or pericardial calcification. There is
aortic atherosclerosis, as well as atherosclerosis of the great
vessels of the mediastinum and the coronary arteries, including
calcified atherosclerotic plaque in the left main, left anterior
descending and left circumflex coronary arteries.

Mediastinum/Nodes: No pathologically enlarged mediastinal or hilar
lymph nodes. Please note that accurate exclusion of hilar adenopathy
is limited on noncontrast CT scans. Esophagus is unremarkable in
appearance. No axillary lymphadenopathy.

Lungs/Pleura: Multiple small pulmonary nodules scattered throughout
the lungs bilaterally, largest of which is in the left upper lobe
(axial image 174 of series 3), with a volume derived mean diameter 5
mm. No larger more suspicious appearing pulmonary nodules or masses
are noted. No acute consolidative airspace disease. No pleural
effusions. Mild diffuse bronchial wall thickening with mild
centrilobular and paraseptal emphysema.

Upper Abdomen: Unremarkable.



Aortic Atherosclerosis (J37KP-RXZ.Z) and Emphysema (J37KP-SC9.Y).

## 2020-03-27 ENCOUNTER — Ambulatory Visit: Payer: PPO | Admitting: Family Medicine

## 2020-06-14 ENCOUNTER — Other Ambulatory Visit: Payer: Self-pay | Admitting: Family Medicine

## 2020-06-14 NOTE — Telephone Encounter (Signed)
lvm to schedule appt.  

## 2020-06-15 ENCOUNTER — Telehealth: Payer: Self-pay | Admitting: Family Medicine

## 2020-06-15 DIAGNOSIS — Z79899 Other long term (current) drug therapy: Secondary | ICD-10-CM

## 2020-06-15 DIAGNOSIS — I10 Essential (primary) hypertension: Secondary | ICD-10-CM

## 2020-06-15 DIAGNOSIS — E785 Hyperlipidemia, unspecified: Secondary | ICD-10-CM

## 2020-06-15 DIAGNOSIS — Z125 Encounter for screening for malignant neoplasm of prostate: Secondary | ICD-10-CM

## 2020-06-15 DIAGNOSIS — E1165 Type 2 diabetes mellitus with hyperglycemia: Secondary | ICD-10-CM

## 2020-06-15 MED ORDER — CETIRIZINE HCL 10 MG PO TABS: 10.0000 mg | ORAL_TABLET | Freq: Every day | ORAL | 0 refills | Status: DC

## 2020-06-15 NOTE — Telephone Encounter (Signed)
Pt last seen 12/05/19. Please advise. Thank you

## 2020-06-15 NOTE — Telephone Encounter (Signed)
The patient needs a the patient needs to do lipid, liver, metabolic 7, PSA, X7O  Diabetes, hyperlipidemia, hypertension, elevated PSA May have 3 months refill on medication

## 2020-06-15 NOTE — Telephone Encounter (Signed)
lvm to schedule appt.  

## 2020-06-15 NOTE — Telephone Encounter (Signed)
Pt has appt scheduled 10/6  Pt is needing refill on cetirizine (ZYRTEC) 10 MG tablet    [place], [place] [facility] [place]

## 2020-06-15 NOTE — Telephone Encounter (Signed)
Lmtc. Labs ordered and rx sent.

## 2020-06-18 ENCOUNTER — Encounter: Payer: Self-pay | Admitting: *Deleted

## 2020-06-18 NOTE — Telephone Encounter (Signed)
Tried to call again, no answer. Sent message on mychart.

## 2020-06-28 DIAGNOSIS — Z125 Encounter for screening for malignant neoplasm of prostate: Secondary | ICD-10-CM | POA: Diagnosis not present

## 2020-06-28 DIAGNOSIS — I10 Essential (primary) hypertension: Secondary | ICD-10-CM | POA: Diagnosis not present

## 2020-06-28 DIAGNOSIS — Z79899 Other long term (current) drug therapy: Secondary | ICD-10-CM | POA: Diagnosis not present

## 2020-06-28 DIAGNOSIS — E1165 Type 2 diabetes mellitus with hyperglycemia: Secondary | ICD-10-CM | POA: Diagnosis not present

## 2020-06-28 DIAGNOSIS — E785 Hyperlipidemia, unspecified: Secondary | ICD-10-CM | POA: Diagnosis not present

## 2020-06-29 LAB — LIPID PANEL
Chol/HDL Ratio: 4.6 ratio (ref 0.0–5.0)
Cholesterol, Total: 162 mg/dL (ref 100–199)
HDL: 35 mg/dL — ABNORMAL LOW (ref >39)
LDL Chol Calc (NIH): 88 mg/dL (ref 0–99)
Triglycerides: 234 mg/dL — ABNORMAL HIGH (ref 0–149)
VLDL Cholesterol Cal: 39 mg/dL (ref 5–40)

## 2020-06-29 LAB — HEPATIC FUNCTION PANEL
ALT: 13 IU/L (ref 0–44)
AST: 11 IU/L (ref 0–40)
Albumin: 4.8 g/dL (ref 3.8–4.8)
Alkaline Phosphatase: 77 IU/L (ref 44–121)
Bilirubin Total: 0.6 mg/dL (ref 0.0–1.2)
Bilirubin, Direct: 0.17 mg/dL (ref 0.00–0.40)
Total Protein: 7.3 g/dL (ref 6.0–8.5)

## 2020-06-29 LAB — PSA: Prostate Specific Ag, Serum: 5.7 ng/mL — ABNORMAL HIGH (ref 0.0–4.0)

## 2020-06-29 LAB — BASIC METABOLIC PANEL
BUN/Creatinine Ratio: 19 (ref 10–24)
BUN: 19 mg/dL (ref 8–27)
CO2: 27 mmol/L (ref 20–29)
Calcium: 9.8 mg/dL (ref 8.6–10.2)
Chloride: 93 mmol/L — ABNORMAL LOW (ref 96–106)
Creatinine, Ser: 0.98 mg/dL (ref 0.76–1.27)
GFR calc Af Amer: 93 mL/min/{1.73_m2} (ref >59)
GFR calc non Af Amer: 81 mL/min/{1.73_m2} (ref >59)
Glucose: 244 mg/dL — ABNORMAL HIGH (ref 65–99)
Potassium: 4.1 mmol/L (ref 3.5–5.2)
Sodium: 135 mmol/L (ref 134–144)

## 2020-06-29 LAB — HEMOGLOBIN A1C
Est. average glucose Bld gHb Est-mCnc: 252 mg/dL
Hgb A1c MFr Bld: 10.4 % — ABNORMAL HIGH (ref 4.8–5.6)

## 2020-07-11 ENCOUNTER — Other Ambulatory Visit: Payer: Self-pay

## 2020-07-11 ENCOUNTER — Ambulatory Visit (INDEPENDENT_AMBULATORY_CARE_PROVIDER_SITE_OTHER): Payer: PPO | Admitting: Family Medicine

## 2020-07-11 VITALS — BP 122/78 | Temp 98.5°F | Wt 224.6 lb

## 2020-07-11 DIAGNOSIS — Z23 Encounter for immunization: Secondary | ICD-10-CM | POA: Diagnosis not present

## 2020-07-11 DIAGNOSIS — R972 Elevated prostate specific antigen [PSA]: Secondary | ICD-10-CM

## 2020-07-11 DIAGNOSIS — E1165 Type 2 diabetes mellitus with hyperglycemia: Secondary | ICD-10-CM | POA: Diagnosis not present

## 2020-07-11 DIAGNOSIS — J438 Other emphysema: Secondary | ICD-10-CM | POA: Diagnosis not present

## 2020-07-11 MED ORDER — GLIPIZIDE 10 MG PO TABS: ORAL_TABLET | ORAL | 1 refills | Status: DC

## 2020-07-11 MED ORDER — OMEPRAZOLE 20 MG PO CPDR
DELAYED_RELEASE_CAPSULE | ORAL | 1 refills | Status: DC
Start: 2020-07-11 — End: 2021-01-08

## 2020-07-11 MED ORDER — HYDROCHLOROTHIAZIDE 25 MG PO TABS
ORAL_TABLET | ORAL | 1 refills | Status: DC
Start: 2020-07-11 — End: 2021-01-01

## 2020-07-11 MED ORDER — DIPHENOXYLATE-ATROPINE 2.5-0.025 MG PO TABS: 1.0000 | ORAL_TABLET | Freq: Three times a day (TID) | ORAL | 1 refills | Status: DC | PRN

## 2020-07-11 MED ORDER — LANTUS SOLOSTAR 100 UNIT/ML ~~LOC~~ SOPN: PEN_INJECTOR | SUBCUTANEOUS | 6 refills | Status: DC

## 2020-07-11 MED ORDER — AMLODIPINE BESYLATE 5 MG PO TABS: ORAL_TABLET | ORAL | 1 refills | Status: DC

## 2020-07-11 MED ORDER — DIPHENOXYLATE-ATROPINE 2.5-0.025 MG PO TABS
1.0000 | ORAL_TABLET | Freq: Three times a day (TID) | ORAL | 1 refills | Status: DC | PRN
Start: 2020-07-11 — End: 2021-01-01

## 2020-07-11 MED ORDER — PRAVASTATIN SODIUM 40 MG PO TABS
40.0000 mg | ORAL_TABLET | Freq: Every day | ORAL | 1 refills | Status: DC
Start: 2020-07-11 — End: 2021-01-01

## 2020-07-11 MED ORDER — LOSARTAN POTASSIUM 50 MG PO TABS
ORAL_TABLET | ORAL | 1 refills | Status: DC
Start: 2020-07-11 — End: 2021-01-01

## 2020-07-11 NOTE — Progress Notes (Signed)
Subjective:    Patient ID: Roger Phillips, male    DOB: 03-Oct-1956, 64 y.o.   MRN: [id number]  Diabetes He presents for his follow-up diabetic visit. He has type 2 diabetes mellitus. Pertinent negatives for hypoglycemia include no confusion or headaches. Pertinent negatives for diabetes include no chest pain and no weakness. Risk factors for coronary artery disease include diabetes mellitus. Current diabetic treatment includes insulin injections.   Patient states he started back on his insulin when he saw his HgbA1c was over 10.  This patient does not do a good job of taking his insulin He has had no desire in the past to see endocrinologist He understands the importance to get his sugars under better control He does not like to check his insulin He does not like to have anyone prick his finger and check his sugars  Discuss recent labs  Results for orders placed or performed in visit on 06/15/20  Lipid panel  Result Value Ref Range   Cholesterol, Total 162 100 - 199 mg/dL   Triglycerides 234 (H) 0 - 149 mg/dL   HDL 35 (L) >39 mg/dL   VLDL Cholesterol Cal 39 5 - 40 mg/dL   LDL Chol Calc (NIH) 88 0 - 99 mg/dL   Chol/HDL Ratio 4.6 0.0 - 5.0 ratio  Hepatic function panel  Result Value Ref Range   Total Protein 7.3 6.0 - 8.5 g/dL   Albumin 4.8 3.8 - 4.8 g/dL   Bilirubin Total 0.6 0.0 - 1.2 mg/dL   Bilirubin, Direct 0.17 0.00 - 0.40 mg/dL   Alkaline Phosphatase 77 44 - 121 IU/L   AST 11 0 - 40 IU/L   ALT 13 0 - 44 IU/L  Basic metabolic panel  Result Value Ref Range   Glucose 244 (H) 65 - 99 mg/dL   BUN 19 8 - 27 mg/dL   Creatinine, Ser 0.98 0.76 - 1.27 mg/dL   GFR calc non Af Amer 81 >59 mL/min/1.73   GFR calc Af Amer 93 >59 mL/min/1.73   BUN/Creatinine Ratio 19 10 - 24   Sodium 135 134 - 144 mmol/L   Potassium 4.1 3.5 - 5.2 mmol/L   Chloride 93 (L) 96 - 106 mmol/L   CO2 27 20 - 29 mmol/L   Calcium 9.8 8.6 - 10.2 mg/dL  PSA  Result Value Ref Range   Prostate Specific Ag,  Serum 5.7 (H) 0.0 - 4.0 ng/mL  Hemoglobin A1c  Result Value Ref Range   Hgb A1c MFr Bld 10.4 (H) 4.8 - 5.6 %   Est. average glucose Bld gHb Est-mCnc 252 mg/dL  Patient has not followed up with urologist since before the pandemic.  He understands the importance of getting this under better control and getting it checked out for the possibility of prostate cancer  Review of Systems  Constitutional: Negative for activity change.  HENT: Negative for congestion and rhinorrhea.   Respiratory: Negative for cough and shortness of breath.   Cardiovascular: Negative for chest pain.  Gastrointestinal: Negative for abdominal pain, diarrhea, nausea and vomiting.  Genitourinary: Negative for dysuria and hematuria.  Neurological: Negative for weakness and headaches.  Psychiatric/Behavioral: Negative for behavioral problems and confusion.       Objective:   Physical Exam Vitals reviewed.  Constitutional:      General: He is not in acute distress. HENT:     Head: Normocephalic and atraumatic.  Eyes:     General:        Right eye: No  discharge.        Left eye: No discharge.  Neck:     Trachea: No tracheal deviation.  Cardiovascular:     Rate and Rhythm: Normal rate and regular rhythm.     Heart sounds: Normal heart sounds. No murmur heard.   Pulmonary:     Effort: Pulmonary effort is normal. No respiratory distress.     Breath sounds: Normal breath sounds.  Lymphadenopathy:     Cervical: No cervical adenopathy.  Skin:    General: Skin is warm and dry.  Neurological:     Mental Status: He is alert.     Coordination: Coordination normal.  Psychiatric:        Behavior: Behavior normal.     25 minutes spent with patient      Assessment & Plan:  1. Uncontrolled type 2 diabetes mellitus with hyperglycemia ([facility]) Poorly controlled diabetes restart insulin the importance of checking sugars have been his wife checked them write them down send [place] readings on a regular basis - Hemoglobin  V2F - Basic metabolic panel  2. Other emphysema ([place]) Encourage patient to quit smoking.  May use inhalers.  Denies any significant shortness of breath currently. - Hemoglobin C1G - Basic metabolic panel  3. Elevated PSA Probable prostate cancer referral back to urology needs further evaluation patient aware - Ambulatory referral to Urology  4. Need for vaccination Vaccines today - Pneumococcal polysaccharide vaccine 23-valent greater than or equal to 2yo subcutaneous/IM - Flu Vaccine QUAD High Dose(Fluad)

## 2020-08-13 ENCOUNTER — Other Ambulatory Visit: Payer: Self-pay | Admitting: Family Medicine

## 2020-08-29 ENCOUNTER — Other Ambulatory Visit: Payer: Self-pay

## 2020-08-29 ENCOUNTER — Encounter: Payer: Self-pay | Admitting: Urology

## 2020-08-29 ENCOUNTER — Ambulatory Visit (INDEPENDENT_AMBULATORY_CARE_PROVIDER_SITE_OTHER): Payer: PPO | Admitting: Urology

## 2020-08-29 VITALS — BP 127/80 | HR 71 | Temp 98.6°F

## 2020-08-29 DIAGNOSIS — R972 Elevated prostate specific antigen [PSA]: Secondary | ICD-10-CM | POA: Diagnosis not present

## 2020-08-29 DIAGNOSIS — R351 Nocturia: Secondary | ICD-10-CM | POA: Insufficient documentation

## 2020-08-29 LAB — BLADDER SCAN AMB NON-IMAGING: Scan Result: 20

## 2020-08-29 MED ORDER — LEVOFLOXACIN 750 MG PO TABS: 750.0000 mg | ORAL_TABLET | Freq: Once | ORAL | 0 refills | Status: AC

## 2020-08-29 MED ORDER — ALFUZOSIN HCL ER 10 MG PO TB24: 10.0000 mg | ORAL_TABLET | Freq: Every day | ORAL | 11 refills | Status: DC

## 2020-08-29 NOTE — Progress Notes (Signed)
Urological Symptom Review  Patient is experiencing the following symptoms: Frequent urination Get up at night to urinate Leakage of urine Urinary tract infection Erection problems (male only)   Review of Systems  Gastrointestinal (upper)  : Indigestion/heartburn  Gastrointestinal (lower) : Diarrhea  Constitutional : Fatigue  Skin: Negative for skin symptoms  Eyes: Blurred vision  Ear/Nose/Throat : Sinus problems  Hematologic/Lymphatic: Easy bruising  Cardiovascular : Leg swelling  Respiratory : Cough Shortness of breath  Endocrine: Excessive thirst  Musculoskeletal: Back pain Joint pain  Neurological: Headaches dizziness  Psychologic: Negative for psychiatric symptoms

## 2020-08-29 NOTE — Progress Notes (Signed)
08/29/2020 3:34 PM   Roger Phillips [birth year] [id number]  Referring provider: Kathyrn Drown, MD [place],  [place] [id number]  Elevated PSA  HPI: Roger Phillips is a 64yo here for evaluation of elevated PSA. He was previously seen by Dr. Diona Fanti for elevated PSA. He has not had a prostate biopsy. PSA was 4.9 2 years ago and then 4.9 12/2019 and then 5.7 in 06/2020. He has moderate LUTS on no BPH therapy. Stream fair. Nocturia 3x. Mild urgency.   His records from [facility] are as follows: Elevated PSA--New Patient/Consult  HPI: Roger Phillips is a 64 year-old male patient who was referred by Dr. Nicki Reaper A. Wolfgang Phoenix, MD who is here evaluation of his PSA.  He is a patient of Dr Sallee Lange seen in office consultation today.   Referred for PSA elevation. PSA levels as follows:   10.2.2014--3.68  5.25.2016:--3.1  8.1.2019--4.9       PMH: Past Medical History:  Diagnosis Date  . Arthritis    neck and left shoulder  . Asthma   . Back pain    buldging disc  . COPD (chronic obstructive pulmonary disease) ([place])   . Depression    takes Wellbutrin  . Diabetes mellitus    takes Metformin bid  . Emphysema   . GERD (gastroesophageal reflux disease)    takes Omeprazole daily  . Hemorrhoids   . High cholesterol    takes Pravastatin daily  . HTN (hypertension)    takes Lisinopril,Norvasc,and HCTZ daily  . IBS (irritable bowel syndrome)   . Insomnia    doesn't take any meds   . Pneumonia    hx of in 80's and 90's  . Sinus headache    sinus drainage    Surgical History: Past Surgical History:  Procedure Laterality Date  . ANTERIOR CERVICAL DECOMP/DISCECTOMY FUSION  12/04/2011   Procedure: ANTERIOR CERVICAL DECOMPRESSION/DISCECTOMY FUSION 2 LEVELS;  Surgeon: Hosie Spangle, MD;  Location: [facility];  Service: Neurosurgery;  Laterality: N/A;  Cervical Three-Four, Cervical Four-Five anterior cervial decompression with fusion plating and bonegraft  . CARPAL  TUNNEL RELEASE  10+yrs   right side  . COLONOSCOPY N/A 04/19/2014   Procedure: COLONOSCOPY;  Surgeon: Rogene Houston, MD;  [facility]: [facility];  Service: Endoscopy;  Laterality: N/A;  1030-moved to 1230 Ann to notify pt  . CYSTECTOMY  12-31yr ago   from left side of neck    Home Medications:  Allergies as of 08/29/2020      Reactions   Sulfa Antibiotics Rash      Medication List       Accurate as of August 29, 2020  3:34 PM. If you have any questions, ask your nurse or doctor.        Accu-Chek Aviva Plus w/Device Kit   Accu-Chek Aviva Soln   Accu-Chek Softclix Lancets lancets   albuterol 108 (90 Base) MCG/ACT inhaler Commonly known as: Ventolin HFA INHALE TWO PUFFS EVERY FOUR HOURS AS NEEDED. SHAKE WELL BEFORE EACH USE.   albuterol (2.5 MG/3ML) 0.083% nebulizer solution Commonly known as: PROVENTIL Take 3 mLs (2.5 mg total) by nebulization every 6 (six) hours as needed for wheezing or shortness of breath.   amLODipine 5 MG tablet Commonly known as: NORVASC TAKE ONE (1) TABLET EACH DAY   aspirin 81 MG tablet Take 81 mg by mouth daily.   B-D SINGLE USE SWABS REGULAR Pads   cetirizine 10 MG tablet Commonly known as: ZYRTEC Take  1 tablet (10 mg total) by mouth at bedtime.   diphenoxylate-atropine 2.5-0.025 MG tablet Commonly known as: LOMOTIL Take 1 tablet by mouth 3 (three) times daily as needed.   fluconazole 200 MG tablet Commonly known as: DIFLUCAN Take 1 tablet (200 mg total) by mouth daily.   Fluticasone-Salmeterol 500-50 MCG/DOSE Aepb Commonly known as: Advair Diskus USE ONE PUFF TWICE DAILY   FREESTYLE LITE test strip Generic drug: glucose blood USE TO TEST BLOOD GLUCOSE THREE TIMES DAILY   glipiZIDE 10 MG tablet Commonly known as: GLUCOTROL TAKE ONE AND ONE-HALF TABLETS BY MOUTH TWICE A DAY   hydrochlorothiazide 25 MG tablet Commonly known as: HYDRODIURIL TAKE ONE (1) TABLET BY MOUTH EVERY DAY   ketoconazole 2 % cream Commonly known  as: NIZORAL Apply 1 application topically 2 (two) times daily.   Lantus SoloStar 100 UNIT/ML Solostar Pen Generic drug: insulin glargine 24 units qhs. May titrate up to 50 units   losartan 50 MG tablet Commonly known as: COZAAR TAKE ONE TABLET EACH DAY   omeprazole 20 MG capsule Commonly known as: PRILOSEC TAKE ONE (1) CAPSULE EACH DAY   pravastatin 40 MG tablet Commonly known as: PRAVACHOL Take 1 tablet (40 mg total) by mouth daily.   sildenafil 20 MG tablet Commonly known as: REVATIO TAKE UP TO THREE TABLETS BY MOUTH AS NEEDED       Allergies:  Allergies  Allergen Reactions  . Sulfa Antibiotics Rash    Family History: Family History  Problem Relation Age of Onset  . Heart disease Father   . Hypertension Father   . Heart disease Brother   . Anesthesia problems Neg Hx   . Hypotension Neg Hx   . Malignant hyperthermia Neg Hx   . Pseudochol deficiency Neg Hx     Social History:  reports that he has been smoking cigarettes. He has a 60.00 pack-year smoking history. He has never used smokeless tobacco. He reports current alcohol use. He reports that he does not use drugs.  ROS: All other review of systems were reviewed and are negative except what is noted above in HPI  Physical Exam: BP 127/80   Pulse 71   Temp 98.6 F (37 C)   Constitutional:  Alert and oriented, No acute distress. HEENT: [place] AT, moist mucus membranes.  Trachea midline, no masses. Cardiovascular: No clubbing, cyanosis, or edema. Respiratory: Normal respiratory effort, no increased work of breathing. GI: Abdomen is soft, nontender, nondistended, no abdominal masses GU: No CVA tenderness. Circumcised phallus. No masses/lesions on penis, testis, scrotum. Prostate 40g smooth no nodules no induration.  Lymph: No cervical or inguinal lymphadenopathy. Skin: No rashes, bruises or suspicious lesions. Neurologic: Grossly intact, no focal deficits, moving all 4 extremities. Psychiatric: Normal mood  and affect.  Laboratory Data: Lab Results  Component Value Date   WBC 6.9 03/30/2013   HGB 16.4 03/30/2013   HCT 45.8 03/30/2013   MCV 85.3 03/30/2013   PLT 247 03/30/2013    Lab Results  Component Value Date   CREATININE 0.98 06/28/2020    Lab Results  Component Value Date   PSA 3.68 07/07/2013    No results found for: TESTOSTERONE  Lab Results  Component Value Date   HGBA1C 10.4 (H) 06/28/2020    Urinalysis No results found for: COLORURINE, APPEARANCEUR, LABSPEC, PHURINE, GLUCOSEU, HGBUR, BILIRUBINUR, KETONESUR, PROTEINUR, UROBILINOGEN, NITRITE, LEUKOCYTESUR  Lab Results  Component Value Date   LABMICR 33.6 12/23/2019    Pertinent Imaging:  No results found for this or any  previous visit.  No results found for this or any previous visit.  No results found for this or any previous visit.  No results found for this or any previous visit.  No results found for this or any previous visit.  No results found for this or any previous visit.  No results found for this or any previous visit.  No results found for this or any previous visit.   Assessment & Plan:    1. Elevated PSA -The patient and I talked about etiologies of elevated PSA.  We discussed the possible relationship between elevated PSA, prostate cancer, BPH, prostatitis, and UTI.   Conservative treatment of elevated PSA with watchful waiting was discussed with the patient.  All questions were answered.        All of the risks and benefits along with alternatives to prostate biopsy were discussed with the patient.  The patient gave fully informed consent to proceed with a transrectal ultrasound guided biopsy of the prostate for the evaluation of their evated PSA.  Prostate biopsy instructions and antibiotics were given to the patient.  - Urinalysis, Routine w reflex microscopic  2. Nocturia -we will trial uroxatral 20m  - BLADDER SCAN AMB NON-IMAGING   No follow-ups on file.  PNicolette Bang MD  CWestern [facility] AssocUrology R[place]

## 2020-08-29 NOTE — Patient Instructions (Addendum)
Prostate-Specific Antigen Test Why am I having this test? The prostate-specific antigen (PSA) test is a screening test for prostate cancer. It can identify early signs of prostate cancer, which may allow for more effective treatment. Your health care provider may recommend that you have a PSA test starting at age 66 or that you have one earlier or later, depending on your risk factors for prostate cancer. You may also have a PSA test:  To monitor treatment of prostate cancer.  To check whether prostate cancer has returned after treatment.  If you have signs of other conditions that can affect PSA levels, such as: ? An enlarged prostate that is not caused by cancer (benign prostatic hyperplasia, BPH). This condition is very common in older men. ? A prostate infection. What is being tested? This test measures the amount of PSA in your blood. PSA is a protein that is made in the prostate. The prostate naturally produces more PSA as you age, but very high levels may be a sign of a medical condition. What kind of sample is taken?  A blood sample is required for this test. It is usually collected by inserting a needle into a blood vessel or by sticking a finger with a small needle. Blood for this test should be drawn before having an exam of the prostate. How do I prepare for this test? Do not ejaculate starting 24 hours before your test, or as long as told by your health care provider. Tell a health care provider about:  Any allergies you have.  All medicines you are taking, including vitamins, herbs, eye drops, creams, and over-the-counter medicines. This also includes: ? Medicines to assist with hair growth, such as finasteride. ? Any recent exposure to a medicine called diethylstilbestrol.  Any blood disorders you have.  Any recent procedures you have had, especially any procedures involving the prostate or rectum.  Any medical conditions you have.  Any recent urinary tract infections  (UTIs) you have had. How are the results reported? Your test results will be reported as a value that indicates how much PSA is in your blood. This will be given as nanograms of PSA per milliliter of blood (ng/mL). Your health care provider will compare your results to normal ranges that were established after testing a large group of people (reference ranges). Reference ranges may vary among labs and hospitals. PSA levels vary from person to person and generally increase with age. Because of this variation, there is no single PSA value that is considered normal for everyone. Instead, PSA reference ranges are used to describe whether your PSA levels are considered low or high (elevated). Common reference ranges are:  Low: 0-2.5 ng/mL.  Slightly to moderately elevated: 2.6-10.0 ng/mL.  Moderately elevated: 10.0-19.9 ng/mL.  Significantly elevated: 20 ng/mL or greater. Sometimes, the test results may report that a condition is present when it is not present (false-positive result). What do the results mean? A test result that is higher than 4 ng/mL may mean that you are at an increased risk for prostate cancer. However, a PSA test by itself is not enough to diagnose prostate cancer. High PSA levels may also be caused by the natural aging process, prostate infection, or BPH. PSA screening cannot tell you if your PSA is high due to cancer or a different cause. A prostate biopsy is the only way to diagnose prostate cancer. A risk of having the PSA test is diagnosing and treating prostate cancer that would never have caused any   symptoms or problems (overdiagnosis and overtreatment). Talk with your health care provider about what your results mean. Questions to ask your health care provider Ask your health care provider, or the department that is doing the test:  When will my results be ready?  How will I get my results?  What are my treatment options?  What other tests do I need?  What are my  next steps? Summary  The prostate-specific antigen (PSA) test is a screening test for prostate cancer.  Your health care provider may recommend that you have a PSA test starting at age 66 or that you have one earlier or later, depending on your risk factors for prostate cancer.  A test result that is higher than 4 ng/mL may mean that you are at an increased risk for prostate cancer. However, elevated levels can be caused by a number of conditions other than prostate cancer.  Talk with your health care provider about what your results mean. This information is not intended to replace advice given to you by your health care provider. Make sure you discuss any questions you have with your health care provider. Document Revised: 09/04/2017 Document Reviewed: 06/29/2017 Elsevier Patient Education  [place].            Appointment Time: 12:30 Please arrive by 12:15   Appointment Date:  09/26/20  Location: [facility]   Prostate Biopsy Instructions  Stop all aspirin or blood thinners (aspirin, plavix, coumadin, warfarin, motrin, ibuprofen, advil, aleve, naproxen, naprosyn) for 7 days prior to the procedure.  If you have any questions about stopping these medications, please contact your primary care physician or cardiologist.  Having a light meal prior to the procedure is recommended.  If you are diabetic or have low blood sugar please bring a small snack or glucose tablet.  A Fleets enema is needed to be purchased over the counter at a local pharmacy and used 2 hours before you scheduled appointment.  This can be purchased over the counter at any pharmacy.  Antibiotics will be administered in the clinic at the time of the procedure and 1 tablet has been sent to your pharmacy. Please take the antibiotic as prescribed.    Please bring someone with you to the procedure to drive you home if you are given a valium to take prior to your procedure.   If you  have any questions or concerns, please feel free to call the office at [phone] or send a Mychart message.    Thank you, [facility]

## 2020-08-30 LAB — URINALYSIS, ROUTINE W REFLEX MICROSCOPIC
Bilirubin, UA: NEGATIVE
Ketones, UA: NEGATIVE
Leukocytes,UA: NEGATIVE
Nitrite, UA: NEGATIVE
Protein,UA: NEGATIVE
RBC, UA: NEGATIVE
Specific Gravity, UA: 1.025 (ref 1.005–1.030)
Urobilinogen, Ur: 1 mg/dL (ref 0.2–1.0)
pH, UA: 6 (ref 5.0–7.5)

## 2020-09-26 ENCOUNTER — Encounter (HOSPITAL_COMMUNITY): Payer: Self-pay

## 2020-09-26 ENCOUNTER — Other Ambulatory Visit: Payer: Self-pay

## 2020-09-26 ENCOUNTER — Ambulatory Visit (HOSPITAL_COMMUNITY)
Admission: RE | Admit: 2020-09-26 | Discharge: 2020-09-26 | Disposition: A | Discharge status: Home / Self Care | Payer: PPO | Source: Ambulatory Visit | Attending: Urology | Admitting: Urology

## 2020-09-26 ENCOUNTER — Other Ambulatory Visit: Payer: Self-pay | Admitting: Urology

## 2020-09-26 ENCOUNTER — Ambulatory Visit (INDEPENDENT_AMBULATORY_CARE_PROVIDER_SITE_OTHER): Payer: PPO | Admitting: Urology

## 2020-09-26 DIAGNOSIS — R972 Elevated prostate specific antigen [PSA]: Secondary | ICD-10-CM

## 2020-09-26 DIAGNOSIS — C61 Malignant neoplasm of prostate: Secondary | ICD-10-CM | POA: Insufficient documentation

## 2020-09-26 MED ORDER — GENTAMICIN SULFATE 40 MG/ML IJ SOLN: 80.0000 mg | Freq: Once | INTRAMUSCULAR | Status: AC

## 2020-09-26 MED ORDER — GENTAMICIN SULFATE 40 MG/ML IJ SOLN
INTRAMUSCULAR | Status: AC
  Administered 2020-09-26: 13:00:00 80 mg via INTRAMUSCULAR
  Filled 2020-09-26: qty 2

## 2020-09-26 MED ORDER — LIDOCAINE HCL (PF) 2 % IJ SOLN
INTRAMUSCULAR | Status: AC
  Administered 2020-09-26: 13:00:00 10 mL
  Filled 2020-09-26: qty 10

## 2020-09-26 NOTE — Discharge Instructions (Signed)

## 2020-09-26 NOTE — Sedation Documentation (Signed)
PT tolerated prostate biopsy procedure well today. Labs obtained and sent for pathology at 1330. PT ambulatory at discharge back to waiting room with no acute distress noted and verbalized understanding of discharge instructions. PT to follow up with urologist as scheduled on 10/10/20.

## 2020-10-02 ENCOUNTER — Encounter: Payer: Self-pay | Admitting: Urology

## 2020-10-02 NOTE — Patient Instructions (Signed)

## 2020-10-02 NOTE — Progress Notes (Signed)
Prostate Biopsy Procedure   Informed consent was obtained after discussing risks/benefits of the procedure.  A time out was performed to ensure correct patient identity.  Pre-Procedure: - Last PSA Level:  Lab Results  Component Value Date   PSA 3.68 07/07/2013   - Gentamicin given prophylactically - Levaquin 500 mg administered PO -Transrectal Ultrasound performed revealing a 81.3 gm prostate -No significant hypoechoic or median lobe noted  Procedure: - Prostate block performed using 10 cc 1% lidocaine and biopsies taken from sextant areas, a total of 12 under ultrasound guidance.  Post-Procedure: - Patient tolerated the procedure well - He was counseled to seek immediate medical attention if experiences any severe pain, significant bleeding, or fevers - Return in one week to discuss biopsy results

## 2020-10-10 ENCOUNTER — Ambulatory Visit (INDEPENDENT_AMBULATORY_CARE_PROVIDER_SITE_OTHER): Payer: PPO | Admitting: Urology

## 2020-10-10 ENCOUNTER — Other Ambulatory Visit: Payer: Self-pay

## 2020-10-10 ENCOUNTER — Encounter: Payer: Self-pay | Admitting: Urology

## 2020-10-10 VITALS — BP 133/76 | HR 70 | Temp 98.7°F | Ht 72.0 in | Wt 225.0 lb

## 2020-10-10 DIAGNOSIS — C61 Malignant neoplasm of prostate: Secondary | ICD-10-CM | POA: Diagnosis not present

## 2020-10-10 NOTE — Progress Notes (Signed)
10/10/2020 4:25 PM   JOCOB DAMBACH 02/20/1956 [id number]  Referring provider: Kathyrn Drown, MD [address] [place],  [place] [id number]  followup prostate biopsy  HPI: Mr Cloward is a 76EU here for followup after prostate biopsy. Biopsy revealed Gleason 3+4=7 in 1/12 cores and Gleason 3+3=6 in 1/12 cores. PSA 5.7. He has moderate LUTS and is currently not taking uroxatral 72m. Nocturia 4-5x. He has erectile dysfunction and takes sildenafil prn.    PMH: Past Medical History:  Diagnosis Date  . Arthritis    neck and left shoulder  . Asthma   . Back pain    buldging disc  . COPD (chronic obstructive pulmonary disease) ([place]   . Depression    takes Wellbutrin  . Diabetes mellitus    takes Metformin bid  . Emphysema   . GERD (gastroesophageal reflux disease)    takes Omeprazole daily  . Hemorrhoids   . High cholesterol    takes Pravastatin daily  . HTN (hypertension)    takes Lisinopril,Norvasc,and HCTZ daily  . IBS (irritable bowel syndrome)   . Insomnia    doesn't take any meds   . Pneumonia    hx of in 80's and 90's  . Sinus headache    sinus drainage    Surgical History: Past Surgical History:  Procedure Laterality Date  . ANTERIOR CERVICAL DECOMP/DISCECTOMY FUSION  12/04/2011   Procedure: ANTERIOR CERVICAL DECOMPRESSION/DISCECTOMY FUSION 2 LEVELS;  Surgeon: RHosie Spangle MD;  [facility];  Service: Neurosurgery;  Laterality: N/A;  Cervical Three-Four, Cervical Four-Five anterior cervial decompression with fusion plating and bonegraft  . CARPAL TUNNEL RELEASE  10+yrs   right side  . COLONOSCOPY N/A 04/19/2014   Procedure: COLONOSCOPY;  Surgeon: NRogene Houston MD;  [facility];  Service: Endoscopy;  Laterality: N/A;  1030-moved to 1230 Ann to notify pt  . CYSTECTOMY  [id number]   from left side of neck    Home Medications:  Allergies as of 10/10/2020      Reactions   Sulfa Antibiotics Rash      Medication List        Accurate as of October 10, 2020  4:25 PM. If you have any questions, ask your nurse or doctor.        Accu-Chek Aviva Plus w/Device Kit   Accu-Chek Aviva Soln   Accu-Chek Softclix Lancets lancets   albuterol 108 (90 Base) MCG/ACT inhaler Commonly known as: Ventolin HFA INHALE TWO PUFFS EVERY FOUR HOURS AS NEEDED. SHAKE WELL BEFORE EACH USE.   albuterol (2.5 MG/3ML) 0.083% nebulizer solution Commonly known as: PROVENTIL Take 3 mLs (2.5 mg total) by nebulization every 6 (six) hours as needed for wheezing or shortness of breath.   alfuzosin 10 MG 24 hr tablet Commonly known as: UROXATRAL Take 1 tablet (10 mg total) by mouth at bedtime.   amLODipine 5 MG tablet Commonly known as: NORVASC TAKE ONE (1) TABLET EACH DAY   aspirin 81 MG tablet Take 81 mg by mouth daily.   B-D SINGLE USE SWABS REGULAR Pads   cetirizine 10 MG tablet Commonly known as: ZYRTEC Take 1 tablet (10 mg total) by mouth at bedtime.   diphenoxylate-atropine 2.5-0.025 MG tablet Commonly known as: LOMOTIL Take 1 tablet by mouth 3 (three) times daily as needed.   fluconazole 200 MG tablet Commonly known as: DIFLUCAN Take 1 tablet (200 mg total) by mouth daily.   Fluticasone-Salmeterol 500-50 MCG/DOSE Aepb Commonly known as: Advair  Diskus USE ONE PUFF TWICE DAILY   FREESTYLE LITE test strip Generic drug: glucose blood USE TO TEST BLOOD GLUCOSE THREE TIMES DAILY   glipiZIDE 10 MG tablet Commonly known as: GLUCOTROL TAKE ONE AND ONE-HALF TABLETS BY MOUTH TWICE A DAY   hydrochlorothiazide 25 MG tablet Commonly known as: HYDRODIURIL TAKE ONE (1) TABLET BY MOUTH EVERY DAY   ketoconazole 2 % cream Commonly known as: NIZORAL Apply 1 application topically 2 (two) times daily.   Lantus SoloStar 100 UNIT/ML Solostar Pen Generic drug: insulin glargine 24 units qhs. May titrate up to 50 units   losartan 50 MG tablet Commonly known as: COZAAR TAKE ONE TABLET EACH DAY   omeprazole 20 MG  capsule Commonly known as: PRILOSEC TAKE ONE (1) CAPSULE EACH DAY   pravastatin 40 MG tablet Commonly known as: PRAVACHOL Take 1 tablet (40 mg total) by mouth daily.   sildenafil 20 MG tablet Commonly known as: REVATIO TAKE UP TO THREE TABLETS BY MOUTH AS NEEDED       Allergies:  Allergies  Allergen Reactions  . Sulfa Antibiotics Rash    Family History: Family History  Problem Relation Age of Onset  . Heart disease Father   . Hypertension Father   . Heart disease Brother   . Anesthesia problems Neg Hx   . Hypotension Neg Hx   . Malignant hyperthermia Neg Hx   . Pseudochol deficiency Neg Hx     Social History:  reports that he has been smoking cigarettes. He has a 60.00 pack-year smoking history. He has never used smokeless tobacco. He reports current alcohol use. He reports that he does not use drugs.  ROS: All other review of systems were reviewed and are negative except what is noted above in HPI  Physical Exam: BP 133/76   Pulse 70   Temp 98.7 F (37.1 C)   Ht 6' (1.829 m)   Wt 225 lb (102.1 kg)   BMI 30.52 kg/m   Constitutional:  Alert and oriented, No acute distress. HEENT: [place] AT, moist mucus membranes.  Trachea midline, no masses. Cardiovascular: No clubbing, cyanosis, or edema. Respiratory: Normal respiratory effort, no increased work of breathing. GI: Abdomen is soft, nontender, nondistended, no abdominal masses GU: No CVA tenderness.  Lymph: No cervical or inguinal lymphadenopathy. Skin: No rashes, bruises or suspicious lesions. Neurologic: Grossly intact, no focal deficits, moving all 4 extremities. Psychiatric: Normal mood and affect.  Laboratory Data: Lab Results  Component Value Date   WBC 6.9 03/30/2013   HGB 16.4 03/30/2013   HCT 45.8 03/30/2013   MCV 85.3 03/30/2013   PLT 247 03/30/2013    Lab Results  Component Value Date   CREATININE 0.98 06/28/2020    Lab Results  Component Value Date   PSA 3.68 07/07/2013    No results  found for: TESTOSTERONE  Lab Results  Component Value Date   HGBA1C 10.4 (H) 06/28/2020    Urinalysis    Component Value Date/Time   APPEARANCEUR Clear 08/29/2020 1453   GLUCOSEU 1+ (A) 08/29/2020 1453   BILIRUBINUR Negative 08/29/2020 1453   PROTEINUR Negative 08/29/2020 1453   NITRITE Negative 08/29/2020 1453   LEUKOCYTESUR Negative 08/29/2020 1453    Lab Results  Component Value Date   LABMICR 33.6 12/23/2019    Pertinent Imaging:  No results found for this or any previous visit.  No results found for this or any previous visit.  No results found for this or any previous visit.  No results found for  this or any previous visit.  No results found for this or any previous visit.  No results found for this or any previous visit.  No results found for this or any previous visit.  No results found for this or any previous visit.   Assessment & Plan:    1. Prostate cancer [facility]) I discussed the natural history of favorable intermediate risk prostate cancer with the patient and the various treatment options including active surveillance, RALP, IMRT, brachytherapy, cryotherapy, HIFU and ADT. After discussing the options the patient elects for radiation therapy. I will have him see Dr Tammi Klippel for consideration of radiation therapy   No follow-ups on file.  Nicolette Bang, MD  [facility]

## 2020-10-10 NOTE — Patient Instructions (Signed)
Prostate Cancer  The prostate is a male gland that helps make semen. Prostate cancer is when abnormal cells grow in this gland. Follow these instructions at home:  Take over-the-counter and prescription medicines only as told by your doctor.  Eat a healthy diet.  Get plenty of sleep.  Ask your doctor for help to find a support group for men with prostate cancer.  Keep all follow-up visits as told by your doctor. This is important.  If you have to go to the hospital, let your cancer doctor (oncologist) know.  Touch, hold, hug, and caress your partner to continue to show sexual feelings. Contact a doctor if:  You have trouble peeing (urinating).  You have blood in your pee (urine).  You have pain in your hips, back, or chest. Get help right away if:  You have weakness in your legs.  You lose feeling (have numbness) in your legs.  You cannot control your pee or your poop (stool).  You have trouble breathing.  You have sudden pain in your chest.  You have chills or a fever. Summary  The prostate is a male gland that helps make semen. Prostate cancer is when abnormal cells grow in this gland.  Ask your doctor for help to find a support group for men with prostate cancer.  Contact a doctor if you have problems peeing or have any new pain that you did not have before. This information is not intended to replace advice given to you by your health care provider. Make sure you discuss any questions you have with your health care provider. Document Revised: 09/04/2017 Document Reviewed: 06/02/2016 Elsevier Patient Education  2020 Elsevier Inc.  

## 2020-10-11 ENCOUNTER — Telehealth: Payer: Self-pay | Admitting: *Deleted

## 2020-10-11 NOTE — Telephone Encounter (Signed)
LVM for call back to schedule appointment with Dr. Manning. 

## 2020-10-11 NOTE — Telephone Encounter (Signed)
LVM again after a returning phone call to schedule appointment with Dr. Kathrynn Running.

## 2020-10-29 ENCOUNTER — Encounter: Payer: Self-pay | Admitting: Radiation Oncology

## 2020-10-29 NOTE — Progress Notes (Signed)
GU Location of Tumor / Histology: prostatic adenocarcinoma  If Prostate Cancer, Gleason Score is (3 + 4) and PSA is (5.7). Prostate volume: 81.3 g    Biopsies of prostate (if applicable) revealed:    Past/Anticipated interventions by urology, if any: prostate biopsy, referral to Dr. Tammi Klippel to discuss radiation options  Past/Anticipated interventions by medical oncology, if any: no  Weight changes, if any: denies  Bowel/Bladder complaints, if any: IPSS 19. SHIM 21 with aid of Viagra. Denies hematuria. Reports dysuria managed with AZO. Reports urinary leakage and incontinence. Reports nocturia x 4-5. Denies any bowel complaints.   Nausea/Vomiting, if any: denies  Pain issues, if any:  denies  SAFETY ISSUES:  Prior radiation? denies  Pacemaker/ICD? denies  Possible current pregnancy? No, male patient  Is the patient on methotrexate? denies  Current Complaints / other details:  65 year old male. Married with two children. Retired. Smoker.

## 2020-10-30 ENCOUNTER — Ambulatory Visit
Admission: RE | Admit: 2020-10-30 | Discharge: 2020-10-30 | Disposition: A | Discharge status: Home / Self Care | Payer: HMO | Source: Ambulatory Visit | Attending: Radiation Oncology | Admitting: Radiation Oncology

## 2020-10-30 ENCOUNTER — Encounter: Payer: Self-pay | Admitting: Radiation Oncology

## 2020-10-30 ENCOUNTER — Other Ambulatory Visit: Payer: Self-pay

## 2020-10-30 VITALS — Ht 72.0 in | Wt 219.0 lb

## 2020-10-30 DIAGNOSIS — C61 Malignant neoplasm of prostate: Secondary | ICD-10-CM

## 2020-10-30 DIAGNOSIS — R972 Elevated prostate specific antigen [PSA]: Secondary | ICD-10-CM | POA: Diagnosis not present

## 2020-10-30 HISTORY — DX: Malignant neoplasm of prostate: C61

## 2020-10-30 NOTE — Progress Notes (Signed)
Radiation Oncology         [phone] ________________________________  Initial Outpatient Consultation - Conducted via Telephone due to current COVID-19 concerns for limiting patient exposure  Name: Roger Phillips MRN: [id number]  Date: 10/30/2020  DOB: March 07, 1956  UT:[id number], Elayne Snare, MD  McKenzie, Candee Furbish, MD   REFERRING PHYSICIAN: Cleon Gustin, MD  DIAGNOSIS: 65 y.o. gentleman with Stage T1c adenocarcinoma of the prostate with Gleason score of 3+4, and PSA of 5.7.    ICD-10-CM   1. Prostate cancer ([facility])  C61     HISTORY OF PRESENT ILLNESS: Roger Phillips is a 65 y.o. male with a diagnosis of prostate cancer. He was previously seen by Dr. Diona Fanti in 2019 for an elevated PSA of 4.9, but he never underwent a prostate biopsy.  More recently, he was noted to have an elevated PSA of 5.7 by his primary care physician, Dr. Wolfgang Phoenix.  Accordingly, he was referred for evaluation in urology by Dr. Alyson Ingles on 08/29/2020,  digital rectal examination was performed at that time revealing no nodules.  The patient proceeded to transrectal ultrasound with 12 biopsies of the prostate on 09/26/2020.  The prostate volume measured 81.3 cc.  Out of 12 core biopsies, 2 were positive.  The maximum Gleason score was 3+4, and this was seen in the right apex.  Additionally, a small focus of Gleason 3+3 was seen in the right mid lateral.  The patient reviewed the biopsy results with his urologist and he has kindly been referred today for discussion of potential radiation treatment options.   PREVIOUS RADIATION THERAPY: No  PAST MEDICAL HISTORY:  Past Medical History:  Diagnosis Date   Arthritis    neck and left shoulder   Asthma    Back pain    buldging disc   COPD (chronic obstructive pulmonary disease) (HCC)    Depression    takes Wellbutrin   Diabetes mellitus    takes Metformin bid   Emphysema    GERD (gastroesophageal reflux disease)    takes Omeprazole daily    Hemorrhoids    High cholesterol    takes Pravastatin daily   HTN (hypertension)    takes Lisinopril,Norvasc,and HCTZ daily   IBS (irritable bowel syndrome)    Insomnia    doesn't take any meds    Pneumonia    hx of in 80's and 90's   Prostate cancer ([place])    Sinus headache    sinus drainage      PAST SURGICAL HISTORY: Past Surgical History:  Procedure Laterality Date   ANTERIOR CERVICAL DECOMP/DISCECTOMY FUSION  12/04/2011   Procedure: ANTERIOR CERVICAL DECOMPRESSION/DISCECTOMY FUSION 2 LEVELS;  Surgeon: Hosie Spangle, MD;  Location: [facility];  Service: Neurosurgery;  Laterality: N/A;  Cervical Three-Four, Cervical Four-Five anterior cervial decompression with fusion plating and bonegraft   CARPAL TUNNEL RELEASE  10+yrs   right side   COLONOSCOPY N/A 04/19/2014   Procedure: COLONOSCOPY;  Surgeon: Rogene Houston, MD;  Location: [facility];  Service: Endoscopy;  Laterality: N/A;  1030-moved to 1230 Ann to notify pt   CYSTECTOMY  12-79yr ago   from left side of neck    FAMILY HISTORY:  Family History  Problem Relation Age of Onset   Heart disease Father    Hypertension Father    Heart disease Brother    Anesthesia problems Neg Hx    Hypotension Neg Hx    Malignant hyperthermia Neg Hx    Pseudochol deficiency Neg Hx  Breast cancer Neg Hx    Prostate cancer Neg Hx    Colon cancer Neg Hx    Pancreatic cancer Neg Hx     SOCIAL HISTORY:  Social History   Socioeconomic History   Marital status: Married    Spouse name: Not on file   Number of children: 2   Years of education: Not on file   Highest education level: Not on file  Occupational History   Occupation: retired  Tobacco Use   Smoking status: Current Every Day Smoker    Packs/day: 1.50    Years: 40.00    Pack years: 60.00    Types: Cigarettes   Smokeless tobacco: Never Used  Scientific laboratory technician Use: Never used  Substance and Sexual Activity   Alcohol use: Yes    Drug use: No   Sexual activity: Yes  Other Topics Concern   Not on file  Social History Narrative   Not on file   Social Determinants of Health   Financial Resource Strain: Not on file  Food Insecurity: Not on file  Transportation Needs: Not on file  Physical Activity: Not on file  Stress: Not on file  Social Connections: Not on file  Intimate Partner Violence: Not on file    ALLERGIES: Morphine and related and Sulfa antibiotics  MEDICATIONS:  Current Outpatient Medications  Medication Sig Dispense Refill   ACCU-CHEK SOFTCLIX LANCETS lancets      albuterol (VENTOLIN HFA) 108 (90 Base) MCG/ACT inhaler INHALE TWO PUFFS EVERY FOUR HOURS AS NEEDED. SHAKE WELL BEFORE EACH USE. 18 g 5   Alcohol Swabs (B-D SINGLE USE SWABS REGULAR) PADS      alfuzosin (UROXATRAL) 10 MG 24 hr tablet Take 1 tablet (10 mg total) by mouth at bedtime. 30 tablet 11   amLODipine (NORVASC) 5 MG tablet TAKE ONE (1) TABLET EACH DAY 90 tablet 1   aspirin 81 MG tablet Take 81 mg by mouth daily.     Blood Glucose Calibration (ACCU-CHEK AVIVA) SOLN      Blood Glucose Monitoring Suppl (ACCU-CHEK AVIVA PLUS) W/DEVICE KIT      cetirizine (ZYRTEC) 10 MG tablet Take 1 tablet (10 mg total) by mouth at bedtime. 90 tablet 0   diphenoxylate-atropine (LOMOTIL) 2.5-0.025 MG tablet Take 1 tablet by mouth 3 (three) times daily as needed. 45 tablet 1   Fluticasone-Salmeterol (ADVAIR DISKUS) 500-50 MCG/DOSE AEPB USE ONE PUFF TWICE DAILY 60 each 5   FREESTYLE LITE test strip USE TO TEST BLOOD GLUCOSE THREE TIMES DAILY 100 each 5   glipiZIDE (GLUCOTROL) 10 MG tablet TAKE ONE AND ONE-HALF TABLETS BY MOUTH TWICE A DAY 270 tablet 1   hydrochlorothiazide (HYDRODIURIL) 25 MG tablet TAKE ONE (1) TABLET BY MOUTH EVERY DAY 90 tablet 1   insulin glargine (LANTUS SOLOSTAR) 100 UNIT/ML Solostar Pen 24 units qhs. May titrate up to 50 units 3 mL 6   ketoconazole (NIZORAL) 2 % cream Apply 1 application topically 2 (two)  times daily. 60 g 4   losartan (COZAAR) 50 MG tablet TAKE ONE TABLET EACH DAY 90 tablet 1   omeprazole (PRILOSEC) 20 MG capsule TAKE ONE (1) CAPSULE EACH DAY 90 capsule 1   pravastatin (PRAVACHOL) 40 MG tablet Take 1 tablet (40 mg total) by mouth daily. 90 tablet 1   sildenafil (REVATIO) 20 MG tablet TAKE UP TO THREE TABLETS BY MOUTH AS NEEDED 50 tablet 5   No current facility-administered medications for this encounter.    REVIEW OF SYSTEMS:  On review of systems, the patient reports that he is doing well overall. He denies any chest pain, shortness of breath, cough, fevers, chills, night sweats, unintended weight changes. He denies any bowel disturbances, and denies abdominal pain, nausea or vomiting. He denies any new musculoskeletal or joint aches or pains. His IPSS was 19, indicating moderate to severe urinary symptoms despite taking Uroxatrol daily. He reports dysuria, urinary leakage, incontinence, and nocturia x4-5. He endorses using AZO to manage his dysuria. His SHIM was 21 with the aid of Viagra, indicating he has fairly well-controlled erectile dysfunction. A complete review of systems is obtained and is otherwise negative.    PHYSICAL EXAM:  Wt Readings from Last 3 Encounters:  10/30/20 219 lb (99.3 kg)  10/10/20 225 lb (102.1 kg)  07/11/20 224 lb 9.6 oz (101.9 kg)   Temp Readings from Last 3 Encounters:  10/10/20 98.7 F (37.1 C)  09/26/20 97.7 F (36.5 C) (Oral)  08/29/20 98.6 F (37 C)   BP Readings from Last 3 Encounters:  10/10/20 133/76  09/26/20 128/70  08/29/20 127/80   Pulse Readings from Last 3 Encounters:  10/10/20 70  09/26/20 70  08/29/20 71   Pain Assessment Pain Score: 0-No pain/10  Physical exam not performed in light of telephone consult visit format.   KPS = 90  100 - Normal; no complaints; no evidence of disease. 90   - Able to carry on normal activity; minor signs or symptoms of disease. 80   - Normal activity with effort; some signs  or symptoms of disease. 52   - Cares for self; unable to carry on normal activity or to do active work. 60   - Requires occasional assistance, but is able to care for most of his personal needs. 50   - Requires considerable assistance and frequent medical care. 28   - Disabled; requires special care and assistance. 68   - Severely disabled; hospital admission is indicated although death not imminent. 48   - Very sick; hospital admission necessary; active supportive treatment necessary. 10   - Moribund; fatal processes progressing rapidly. 0     - Dead  Karnofsky DA, Abelmann [place], Craver LS and Burchenal [facility] [phone]) The use of the nitrogen mustards in the palliative treatment of carcinoma: with particular reference to bronchogenic carcinoma Cancer 1 634-56  LABORATORY DATA:  Lab Results  Component Value Date   WBC 6.9 03/30/2013   HGB 16.4 03/30/2013   HCT 45.8 03/30/2013   MCV 85.3 03/30/2013   PLT 247 03/30/2013   Lab Results  Component Value Date   NA 135 06/28/2020   K 4.1 06/28/2020   CL 93 (L) 06/28/2020   CO2 27 06/28/2020   Lab Results  Component Value Date   ALT 13 06/28/2020   AST 11 06/28/2020   ALKPHOS 77 06/28/2020   BILITOT 0.6 06/28/2020     RADIOGRAPHY: No results found.    IMPRESSION/PLAN: This visit was conducted via Telephone to spare the patient unnecessary potential exposure in the healthcare setting during the current COVID-19 pandemic. 1. 65 y.o. gentleman with Stage T1c adenocarcinoma of the prostate with Gleason Score of 3+4, and PSA of 5.7. We discussed the patient's workup and outlined the nature of prostate cancer in this setting. The patient's T stage, Gleason's score, and PSA put him into the favorable intermediate risk group. Accordingly, he is eligible for a variety of potential treatment options including brachytherapy, 5.5 weeks of external radiation, or prostatectomy. We discussed the  available radiation techniques, and focused on the details and  logistics of delivery. The patient is not an ideal candidate for brachytherapy with a prostate volume of 81 cc and severe LUTS despite medical therapy.  Therefore, we discussed and outlined the risks, benefits, short and long-term effects associated with daily external beam radiotherapy and compared and contrasted these with prostatectomy. We discussed the role of SpaceOAR in reducing the rectal toxicity associated with radiotherapy. He appears to have a good understanding of his disease and our treatment recommendations which are of curative intent.  He and his wife were encouraged to ask questions that were answered to their stated satisfaction.  At the end of the conversation, the patient is interested in moving forward with external beam therapy but would prefer to receive daily treatment at [facility] since this is much closer to his home in [place]. We will share our discussion with Dr. Alyson Ingles and make arrangements for referral to Dr. Adella Nissen for consult to discuss radiotherapy closer to home. We enjoyed meeting with him and his wife today and look forward to continuing to follow his progress.  He understands that we would be more than happy to continue to participate in his care should he ultimately decide to pursue treatment in [facility] for any reason.   Given current concerns for patient exposure during the COVID-19 pandemic, this encounter was conducted via telephone. The patient was notified in advance and was offered a MyChart meeting to allow for face to face communication but unfortunately reported that he did not have the appropriate resources/technology to support such a visit and instead preferred to proceed with telephone consult. The patient has given verbal consent for this type of encounter. The time spent during this encounter was 30 minutes. The attendants for this meeting include Tyler Pita MD, Ashlyn Bruning PA-C, Katie Daubenspeck- scribe, and patient, Roger Phillips  and his wife. During the encounter, Tyler Pita MD, Ashlyn Bruning PA-C, and scribe, Wilburn Mylar were located at [place].  Patient, Roger Phillips and his wife were located at home.    Nicholos Johns, PA-C    Tyler Pita, MD  [facility] Direct Dial: [phone]   Fax: [phone] [email or web address]   Skype   [email or web address]   This document serves as a record of services personally performed by Tyler Pita, MD and Freeman Caldron, PA-C. It was created on their behalf by Wilburn Mylar, a trained medical scribe. The creation of this record is based on the scribe's personal observations and the provider's statements to them. This document has been checked and approved by the attending provider.

## 2020-11-05 ENCOUNTER — Other Ambulatory Visit: Payer: Self-pay | Admitting: Family Medicine

## 2020-11-06 ENCOUNTER — Ambulatory Visit: Payer: PPO

## 2020-11-06 ENCOUNTER — Ambulatory Visit: Payer: PPO | Admitting: Radiation Oncology

## 2020-11-08 ENCOUNTER — Other Ambulatory Visit: Payer: Self-pay | Admitting: Family Medicine

## 2020-11-08 MED ORDER — FREESTYLE LANCETS MISC: 3 refills | Status: AC

## 2020-11-09 DIAGNOSIS — Z87891 Personal history of nicotine dependence: Secondary | ICD-10-CM | POA: Diagnosis not present

## 2020-11-09 DIAGNOSIS — Z7982 Long term (current) use of aspirin: Secondary | ICD-10-CM | POA: Diagnosis not present

## 2020-11-09 DIAGNOSIS — R300 Dysuria: Secondary | ICD-10-CM | POA: Diagnosis not present

## 2020-11-09 DIAGNOSIS — C61 Malignant neoplasm of prostate: Secondary | ICD-10-CM | POA: Diagnosis not present

## 2020-11-09 DIAGNOSIS — K589 Irritable bowel syndrome without diarrhea: Secondary | ICD-10-CM | POA: Diagnosis not present

## 2020-11-09 DIAGNOSIS — I10 Essential (primary) hypertension: Secondary | ICD-10-CM | POA: Diagnosis not present

## 2020-11-09 DIAGNOSIS — Z794 Long term (current) use of insulin: Secondary | ICD-10-CM | POA: Diagnosis not present

## 2020-11-09 DIAGNOSIS — E119 Type 2 diabetes mellitus without complications: Secondary | ICD-10-CM | POA: Diagnosis not present

## 2020-11-10 ENCOUNTER — Telehealth: Payer: Self-pay | Admitting: Family Medicine

## 2020-11-10 DIAGNOSIS — I10 Essential (primary) hypertension: Secondary | ICD-10-CM

## 2020-11-10 DIAGNOSIS — E1165 Type 2 diabetes mellitus with hyperglycemia: Secondary | ICD-10-CM

## 2020-11-10 DIAGNOSIS — E785 Hyperlipidemia, unspecified: Secondary | ICD-10-CM

## 2020-11-10 NOTE — Telephone Encounter (Signed)
The patient needs to do an office visit in March to do lab work ahead of time including A1c, CMP, lipid for diabetes hypertension hyperlipidemia thank you

## 2020-11-12 ENCOUNTER — Other Ambulatory Visit: Payer: Self-pay

## 2020-11-12 ENCOUNTER — Telehealth: Payer: Self-pay | Admitting: Urology

## 2020-11-12 DIAGNOSIS — C61 Malignant neoplasm of prostate: Secondary | ICD-10-CM

## 2020-11-12 MED ORDER — FLEET ENEMA 7-19 GM/118ML RE ENEM: 1.0000 | ENEMA | Freq: Once | RECTAL | 0 refills | Status: AC

## 2020-11-12 NOTE — Telephone Encounter (Signed)
Lab order placed and mailed to patient. Placed note on bottom of labs to schedule appt

## 2020-11-12 NOTE — Telephone Encounter (Signed)
Dr Adella Nissen called and LM to speak to Dr Alyson Ingles regarding this patient.

## 2020-11-12 NOTE — Addendum Note (Signed)
Addended by: Vicente Males on: 11/12/2020 10:59 AM   Modules accepted: Orders

## 2020-11-14 ENCOUNTER — Ambulatory Visit: Payer: PPO | Admitting: Urology

## 2020-11-20 ENCOUNTER — Other Ambulatory Visit (HOSPITAL_COMMUNITY): Payer: Self-pay | Admitting: Urology

## 2020-11-20 DIAGNOSIS — C61 Malignant neoplasm of prostate: Secondary | ICD-10-CM

## 2020-11-28 ENCOUNTER — Other Ambulatory Visit (HOSPITAL_COMMUNITY): Payer: Self-pay | Admitting: Urology

## 2020-11-28 DIAGNOSIS — C61 Malignant neoplasm of prostate: Secondary | ICD-10-CM

## 2020-12-03 NOTE — Patient Instructions (Signed)
Roger Phillips  12/03/2020     @PREFPERIOPPHARMACY @   Your procedure is scheduled on  12/06/2020   Report to [facility] at  0800 A.M.   Call this number if you have problems the morning of surgery:  [phone]   Remember:  Do not eat or drink after midnight.                         Take these medicines the morning of surgery with A SIP OF WATER  Uroxatral, amlodipine, prilosec.    Use your inhalers before you come and bring your rescue inhaler with you.  DO NOT take any medications for diabetes the morning of your procedure.  If your glucose is 70 or below the morning of your procedure, Drink 1/2 cup of clear juice and recheck your glucose in 15 minutes. If your glucose is still 70 or below, call [phone] for instructions.  If your glucose is 300 or above the morning of your procedure, call [phone] for instructions.     Do not wear jewelry, make-up or nail polish.  Do not wear lotions, powders, or perfumes, or deodorant.  Do not shave 48 hours prior to surgery.  Men may shave face and neck.  Do not bring valuables to the hospital.  [facility] is not responsible for any belongings or valuables.  Contacts, dentures or bridgework may not be worn into surgery.  Leave your suitcase in the car.  After surgery it may be brought to your room.  For patients admitted to the hospital, discharge time will be determined by your treatment team.  Patients discharged the day of surgery will not be allowed to drive home and must have someone with them for 24 hours.  Place clean sheets on your bed the night before your procedure and DO NOT sleep with pets this night.  Shower the morning of your procedure. Dry off with a clean towel, put on clean, comfortable clothes and brush your teeth before you come to the hospital.   Special instructions:   DO NOT smoke tobacco or vape the morning of your procedure.  Please read over the following fact sheets that you were  given. Coughing and Deep Breathing, Anesthesia Post-op Instructions and Care and Recovery After Surgery       Hydrogel Spacer Implantation for Prostate Cancer, Care After This sheet gives you information about how to care for yourself after your procedure. Your health care provider may also give you more specific instructions. If you have problems or questions, contact your health care provider. What can I expect after the procedure? After the procedure, it is common to have:  A feeling of fullness in your rectum for a few days.  Pink-colored urine for a short time. This should clear as you increase your fluid intake. Follow these instructions at home: Medicines  Take over-the-counter and prescription medicines only as told by your health care provider.  If you were prescribed an antibiotic medicine, take it as told by your health care provider. Do not stop taking the antibiotic even if you start to feel better. Activity  Do not drive for 24 hours if you were given a sedative during your procedure.  Return to your normal activities as told by your health care provider. Ask your health care provider what activities are safe for you.  Do not lift anything that is heavier than 10 lb (4.5 kg), or the limit  that you are told, until your health care provider says that it is safe.   Eating and drinking  Drink enough fluid to keep your urine pale yellow.  Eat foods that are high in fiber, such as beans, whole grains, and fresh fruits and vegetables.  Limit foods that are high in fat and processed sugars, such as fried or sweet foods.   General instructions  Do not place anything into your rectum for 3 months.  Keep all follow-up visits as told by your health care provider. This is important. ? You will need to return for an MRI test before starting your radiation treatment. Contact a health care provider if you have:  A fever.  Chills.  Rectal pain.  Pain when you  urinate.  Trouble passing urine.  Blood in your urine or stool. Get help right away if you have:  Shortness of breath or problems breathing. Summary  After the procedure, it is normal to have a feeling of fullness in your rectum for a few days.  Take over-the-counter and prescription medicines only as told by your health care provider.  Return to your normal activities as told by your health care provider.  Contact your health care provider if you have chills, a fever, pain, or blood in your urine or stool. This information is not intended to replace advice given to you by your health care provider. Make sure you discuss any questions you have with your health care provider. Document Revised: 03/25/2018 Document Reviewed: 03/25/2018 Elsevier Patient Education  2021 [place] After This sheet gives you information about how to care for yourself after your procedure. Your health care provider may also give you more specific instructions. If you have problems or questions, contact your health care provider. What can I expect after the procedure? After the procedure, it is common to have:  Tiredness.  Forgetfulness about what happened after the procedure.  Impaired judgment for important decisions.  Nausea or vomiting.  Some difficulty with balance. Follow these instructions at home: For the time period you were told by your health care provider:  Rest as needed.  Do not participate in activities where you could fall or become injured.  Do not drive or use machinery.  Do not drink alcohol.  Do not take sleeping pills or medicines that cause drowsiness.  Do not make important decisions or sign legal documents.  Do not take care of children on your own.      Eating and drinking  Follow the diet that is recommended by your health care provider.  Drink enough fluid to keep your urine pale yellow.  If you vomit: ? Drink water,  juice, or soup when you can drink without vomiting. ? Make sure you have little or no nausea before eating solid foods. General instructions  Have a responsible adult stay with you for the time you are told. It is important to have someone help care for you until you are awake and alert.  Take over-the-counter and prescription medicines only as told by your health care provider.  If you have sleep apnea, surgery and certain medicines can increase your risk for breathing problems. Follow instructions from your health care provider about wearing your sleep device: ? Anytime you are sleeping, including during daytime naps. ? While taking prescription pain medicines, sleeping medicines, or medicines that make you drowsy.  Avoid smoking.  Keep all follow-up visits as told by your health care provider. This is important.  Contact a health care provider if:  You keep feeling nauseous or you keep vomiting.  You feel light-headed.  You are still sleepy or having trouble with balance after 24 hours.  You develop a rash.  You have a fever.  You have redness or swelling around the IV site. Get help right away if:  You have trouble breathing.  You have new-onset confusion at home. Summary  For several hours after your procedure, you may feel tired. You may also be forgetful and have poor judgment.  Have a responsible adult stay with you for the time you are told. It is important to have someone help care for you until you are awake and alert.  Rest as told. Do not drive or operate machinery. Do not drink alcohol or take sleeping pills.  Get help right away if you have trouble breathing, or if you suddenly become confused. This information is not intended to replace advice given to you by your health care provider. Make sure you discuss any questions you have with your health care provider. Document Revised: 06/07/2020 Document Reviewed: 08/25/2019 Elsevier Patient Education  2021  [employer].

## 2020-12-04 ENCOUNTER — Other Ambulatory Visit (HOSPITAL_COMMUNITY)
Admission: RE | Admit: 2020-12-04 | Discharge: 2020-12-04 | Disposition: A | Discharge status: Home / Self Care | Payer: HMO | Source: Ambulatory Visit | Attending: Urology | Admitting: Urology

## 2020-12-04 ENCOUNTER — Other Ambulatory Visit: Payer: Self-pay

## 2020-12-04 ENCOUNTER — Encounter (HOSPITAL_COMMUNITY)
Admission: RE | Admit: 2020-12-04 | Discharge: 2020-12-04 | Disposition: A | Discharge status: Home / Self Care | Payer: HMO | Source: Ambulatory Visit | Attending: Urology | Admitting: Urology

## 2020-12-04 ENCOUNTER — Encounter (HOSPITAL_COMMUNITY): Payer: Self-pay

## 2020-12-04 DIAGNOSIS — Z01818 Encounter for other preprocedural examination: Secondary | ICD-10-CM | POA: Insufficient documentation

## 2020-12-04 DIAGNOSIS — Z20822 Contact with and (suspected) exposure to covid-19: Secondary | ICD-10-CM | POA: Insufficient documentation

## 2020-12-04 DIAGNOSIS — Z01812 Encounter for preprocedural laboratory examination: Secondary | ICD-10-CM | POA: Insufficient documentation

## 2020-12-04 HISTORY — DX: Other complications of anesthesia, initial encounter: T88.59XA

## 2020-12-04 LAB — HEMOGLOBIN A1C
Hgb A1c MFr Bld: 9.9 % — ABNORMAL HIGH (ref 4.8–5.6)
Mean Plasma Glucose: 237.43 mg/dL

## 2020-12-04 LAB — GLUCOSE, CAPILLARY: Glucose-Capillary: 245 mg/dL — ABNORMAL HIGH (ref 70–99)

## 2020-12-04 LAB — BASIC METABOLIC PANEL
Anion gap: 10 (ref 5–15)
BUN: 18 mg/dL (ref 8–23)
CO2: 28 mmol/L (ref 22–32)
Calcium: 8.8 mg/dL — ABNORMAL LOW (ref 8.9–10.3)
Chloride: 97 mmol/L — ABNORMAL LOW (ref 98–111)
Creatinine, Ser: 0.85 mg/dL (ref 0.61–1.24)
GFR, Estimated: >60 mL/min (ref >60)
Glucose, Bld: 260 mg/dL — ABNORMAL HIGH (ref 70–99)
Potassium: 3.6 mmol/L (ref 3.5–5.1)
Sodium: 135 mmol/L (ref 135–145)

## 2020-12-05 LAB — SARS CORONAVIRUS 2 (TAT 6-24 HRS): SARS Coronavirus 2: NEGATIVE

## 2020-12-06 ENCOUNTER — Ambulatory Visit (HOSPITAL_COMMUNITY): Payer: HMO | Admitting: Anesthesiology

## 2020-12-06 ENCOUNTER — Ambulatory Visit (HOSPITAL_COMMUNITY)
Admission: RE | Admit: 2020-12-06 | Discharge: 2020-12-06 | Disposition: A | Discharge status: Home / Self Care | Payer: HMO | Attending: Urology | Admitting: Urology

## 2020-12-06 ENCOUNTER — Encounter (HOSPITAL_COMMUNITY)
Admission: RE | Disposition: A | Discharge status: Home / Self Care | Payer: Self-pay | Source: Home / Self Care | Attending: Urology

## 2020-12-06 ENCOUNTER — Encounter (HOSPITAL_COMMUNITY): Payer: Self-pay | Admitting: Urology

## 2020-12-06 ENCOUNTER — Encounter: Payer: Self-pay | Admitting: Family Medicine

## 2020-12-06 ENCOUNTER — Other Ambulatory Visit: Payer: Self-pay | Admitting: *Deleted

## 2020-12-06 ENCOUNTER — Ambulatory Visit (HOSPITAL_COMMUNITY)
Admission: RE | Admit: 2020-12-06 | Discharge: 2020-12-06 | Disposition: A | Discharge status: Home / Self Care | Payer: HMO | Source: Ambulatory Visit | Attending: Urology | Admitting: Urology

## 2020-12-06 DIAGNOSIS — F17210 Nicotine dependence, cigarettes, uncomplicated: Secondary | ICD-10-CM | POA: Diagnosis not present

## 2020-12-06 DIAGNOSIS — Z794 Long term (current) use of insulin: Secondary | ICD-10-CM | POA: Insufficient documentation

## 2020-12-06 DIAGNOSIS — C61 Malignant neoplasm of prostate: Secondary | ICD-10-CM | POA: Diagnosis not present

## 2020-12-06 DIAGNOSIS — Z7951 Long term (current) use of inhaled steroids: Secondary | ICD-10-CM | POA: Insufficient documentation

## 2020-12-06 DIAGNOSIS — Z79899 Other long term (current) drug therapy: Secondary | ICD-10-CM | POA: Insufficient documentation

## 2020-12-06 DIAGNOSIS — Z7982 Long term (current) use of aspirin: Secondary | ICD-10-CM | POA: Insufficient documentation

## 2020-12-06 DIAGNOSIS — Z882 Allergy status to sulfonamides status: Secondary | ICD-10-CM | POA: Insufficient documentation

## 2020-12-06 DIAGNOSIS — Z7984 Long term (current) use of oral hypoglycemic drugs: Secondary | ICD-10-CM | POA: Insufficient documentation

## 2020-12-06 DIAGNOSIS — J449 Chronic obstructive pulmonary disease, unspecified: Secondary | ICD-10-CM | POA: Diagnosis not present

## 2020-12-06 HISTORY — PX: SPACE OAR INSTILLATION: SHX6769

## 2020-12-06 HISTORY — PX: GOLD SEED IMPLANT: SHX6343

## 2020-12-06 LAB — GLUCOSE, CAPILLARY: Glucose-Capillary: 217 mg/dL — ABNORMAL HIGH (ref 70–99)

## 2020-12-06 SURGERY — INJECTION, HYDROGEL SPACER
Anesthesia: General

## 2020-12-06 MED ORDER — FENTANYL CITRATE (PF) 100 MCG/2ML IJ SOLN
INTRAMUSCULAR | Status: DC | PRN
  Administered 2020-12-06: 100 ug via INTRAVENOUS

## 2020-12-06 MED ORDER — FENTANYL CITRATE (PF) 100 MCG/2ML IJ SOLN
INTRAMUSCULAR | Status: AC
  Filled 2020-12-06: qty 2

## 2020-12-06 MED ORDER — CEFAZOLIN SODIUM-DEXTROSE 2-4 GM/100ML-% IV SOLN
INTRAVENOUS | Status: AC
  Filled 2020-12-06: qty 100

## 2020-12-06 MED ORDER — ALBUTEROL SULFATE HFA 108 (90 BASE) MCG/ACT IN AERS: INHALATION_SPRAY | RESPIRATORY_TRACT | 2 refills | Status: DC

## 2020-12-06 MED ORDER — MIDAZOLAM HCL 2 MG/2ML IJ SOLN
INTRAMUSCULAR | Status: AC
  Filled 2020-12-06: qty 2

## 2020-12-06 MED ORDER — CHLORHEXIDINE GLUCONATE 0.12 % MT SOLN
15.0000 mL | Freq: Once | OROMUCOSAL | Status: AC
  Administered 2020-12-06: 15 mL via OROMUCOSAL

## 2020-12-06 MED ORDER — EPHEDRINE SULFATE 50 MG/ML IJ SOLN
INTRAMUSCULAR | Status: DC | PRN
  Administered 2020-12-06: 10 mg via INTRAVENOUS

## 2020-12-06 MED ORDER — LIDOCAINE HCL (CARDIAC) PF 100 MG/5ML IV SOSY
PREFILLED_SYRINGE | INTRAVENOUS | Status: DC | PRN
  Administered 2020-12-06: 50 mg via INTRAVENOUS

## 2020-12-06 MED ORDER — FENTANYL CITRATE (PF) 100 MCG/2ML IJ SOLN: 25.0000 ug | INTRAMUSCULAR | Status: DC | PRN

## 2020-12-06 MED ORDER — EPHEDRINE 5 MG/ML INJ
INTRAVENOUS | Status: AC
  Filled 2020-12-06: qty 10

## 2020-12-06 MED ORDER — PROMETHAZINE HCL 25 MG/ML IJ SOLN: 6.2500 mg | INTRAMUSCULAR | Status: DC | PRN

## 2020-12-06 MED ORDER — PROPOFOL 10 MG/ML IV BOLUS
INTRAVENOUS | Status: DC | PRN
  Administered 2020-12-06: 200 mg via INTRAVENOUS

## 2020-12-06 MED ORDER — MIDAZOLAM HCL 5 MG/5ML IJ SOLN
INTRAMUSCULAR | Status: DC | PRN
  Administered 2020-12-06: 2 mg via INTRAVENOUS

## 2020-12-06 MED ORDER — CEFAZOLIN SODIUM-DEXTROSE 2-4 GM/100ML-% IV SOLN
2.0000 g | INTRAVENOUS | Status: AC
  Administered 2020-12-06: 2 g via INTRAVENOUS

## 2020-12-06 MED ORDER — TRAMADOL HCL 50 MG PO TABS: 50.0000 mg | ORAL_TABLET | Freq: Four times a day (QID) | ORAL | 0 refills | Status: AC | PRN

## 2020-12-06 MED ORDER — ONDANSETRON HCL 4 MG/2ML IJ SOLN
INTRAMUSCULAR | Status: DC | PRN
  Administered 2020-12-06: 4 mg via INTRAVENOUS

## 2020-12-06 MED ORDER — MEPERIDINE HCL 50 MG/ML IJ SOLN: 6.2500 mg | INTRAMUSCULAR | Status: DC | PRN

## 2020-12-06 MED ORDER — LACTATED RINGERS IV SOLN
INTRAVENOUS | Status: DC
  Administered 2020-12-06: 1000 mL via INTRAVENOUS

## 2020-12-06 MED ORDER — ORAL CARE MOUTH RINSE: 15.0000 mL | Freq: Once | OROMUCOSAL | Status: AC

## 2020-12-06 MED ORDER — SODIUM CHLORIDE FLUSH 0.9 % IV SOLN
INTRAVENOUS | Status: DC | PRN
  Administered 2020-12-06: 50 mL via INTRAVENOUS

## 2020-12-06 SURGICAL SUPPLY — 21 items
DRAPE C-ARM 35X43 STRL (DRAPES) ×2 IMPLANT
DRSG TEGADERM 4X4.75 (GAUZE/BANDAGES/DRESSINGS) ×2 IMPLANT
DRSG TEGADERM 8X12 (GAUZE/BANDAGES/DRESSINGS) ×2 IMPLANT
GLOVE BIO SURGEON STRL SZ8 (GLOVE) ×4 IMPLANT
GLOVE ECLIPSE 6.0 STRL STRAW (GLOVE) ×2 IMPLANT
GLOVE ECLIPSE 8.0 STRL XLNG CF (GLOVE) ×2 IMPLANT
GLOVE SRG 8 PF TXTR STRL LF DI (GLOVE) ×1 IMPLANT
GLOVE SURG UNDER POLY LF SZ7 (GLOVE) ×2 IMPLANT
GLOVE SURG UNDER POLY LF SZ8 (GLOVE) ×2
GOWN STRL REUS W/TWL LRG LVL3 (GOWN DISPOSABLE) ×2 IMPLANT
GOWN STRL REUS W/TWL XL LVL3 (GOWN DISPOSABLE) ×2 IMPLANT
IMPL SPACEOAR SYSTEM 10ML (Spacer) ×1 IMPLANT
IMPLANT SPACEOAR SYSTEM 10ML (Spacer) ×2 IMPLANT
KIT TURNOVER CYSTO (KITS) ×2 IMPLANT
MARKER GOLD PRELOAD 1.2X3 (Urological Implant) ×1 IMPLANT
NS IRRIG 1000ML POUR BTL (IV SOLUTION) ×2 IMPLANT
PACK CYSTO (CUSTOM PROCEDURE TRAY) ×2 IMPLANT
PAD TELFA 3X4 1S STER (GAUZE/BANDAGES/DRESSINGS) ×2 IMPLANT
SEED GOLD PRELOAD 1.2X3 (Urological Implant) ×2 IMPLANT
SURGILUBE 2OZ TUBE FLIPTOP (MISCELLANEOUS) ×2 IMPLANT
UNDERPAD 30X36 HEAVY ABSORB (UNDERPADS AND DIAPERS) ×4 IMPLANT

## 2020-12-06 NOTE — Anesthesia Postprocedure Evaluation (Signed)
Anesthesia Post Note  Patient: Roger Phillips  Procedure(s) Performed: SPACE OAR INSTILLATION (N/A ) GOLD SEED IMPLANT (N/A )  Patient location during evaluation: PACU Anesthesia Type: General Level of consciousness: awake, oriented, awake and alert and patient cooperative Pain management: satisfactory to patient Vital Signs Assessment: post-procedure vital signs reviewed and stable Respiratory status: spontaneous breathing, respiratory function stable, nonlabored ventilation and patient connected to nasal cannula oxygen Cardiovascular status: stable Postop Assessment: no apparent nausea or vomiting Anesthetic complications: no   No complications documented.   Last Vitals:  Vitals:   12/06/20 0826  BP: (!) 135/91  Pulse: 64  Resp: 18  Temp: 36.6 C  SpO2: 99%    Last Pain:  Vitals:   12/06/20 0826  TempSrc: Oral  PainSc: 0-No pain                 Omelia Marquart

## 2020-12-06 NOTE — Anesthesia Preprocedure Evaluation (Addendum)
Anesthesia Evaluation  Patient identified by MRN, date of birth, ID band Patient awake    Reviewed: Allergy & Precautions, NPO status , Patient's Chart, lab work & pertinent test results  History of Anesthesia Complications (+) PROLONGED EMERGENCE and history of anesthetic complications  Airway Mallampati: II  TM Distance: >3 FB Neck ROM: Full    Dental  (+) Edentulous Upper, Edentulous Lower   Pulmonary asthma , pneumonia, resolved, COPD, Current SmokerPatient did not abstain from smoking.,    Pulmonary exam normal breath sounds clear to auscultation       Cardiovascular Exercise Tolerance: Good hypertension, Pt. on medications + CAD  Normal cardiovascular exam Rhythm:Regular Rate:Normal     Neuro/Psych  Headaches, PSYCHIATRIC DISORDERS Depression    GI/Hepatic negative GI ROS, Neg liver ROS, GERD  Medicated and Controlled,  Endo/Other  diabetes, Poorly Controlled, Type 2, Oral Hypoglycemic Agents, Insulin Dependent  Renal/GU negative Renal ROS     Musculoskeletal  (+) Arthritis  (back pain),   Abdominal   Peds  Hematology negative hematology ROS (+)   Anesthesia Other Findings   Reproductive/Obstetrics                            Anesthesia Physical Anesthesia Plan  ASA: III  Anesthesia Plan: General   Post-op Pain Management:    Induction: Intravenous  PONV Risk Score and Plan: 3 and Ondansetron and Midazolam  Airway Management Planned: LMA  Additional Equipment:   Intra-op Plan:   Post-operative Plan: Extubation in OR  Informed Consent: I have reviewed the patients History and Physical, chart, labs and discussed the procedure including the risks, benefits and alternatives for the proposed anesthesia with the patient or authorized representative who has indicated his/her understanding and acceptance.     Dental advisory given  Plan Discussed with: CRNA and  Surgeon  Anesthesia Plan Comments:        Anesthesia Quick Evaluation

## 2020-12-06 NOTE — Anesthesia Procedure Notes (Signed)
Procedure Name: LMA Insertion Date/Time: 12/06/2020 10:31 AM Performed by: Jonna Munro, CRNA Pre-anesthesia Checklist: Patient identified, Emergency Drugs available, Suction available, Patient being monitored and Timeout performed Patient Re-evaluated:Patient Re-evaluated prior to induction Oxygen Delivery Method: Circle system utilized Preoxygenation: Pre-oxygenation with 100% oxygen Induction Type: IV induction LMA: LMA flexible inserted LMA Size: 5.0 Number of attempts: 1 Placement Confirmation: positive ETCO2 and breath sounds checked- equal and bilateral Tube secured with: Tape Dental Injury: Teeth and Oropharynx as per pre-operative assessment

## 2020-12-06 NOTE — Transfer of Care (Signed)
Immediate Anesthesia Transfer of Care Note  Patient: Roger Phillips  Procedure(s) Performed: SPACE OAR INSTILLATION (N/A ) GOLD SEED IMPLANT (N/A )  Patient Location: PACU  Anesthesia Type:General  Level of Consciousness: awake, alert , oriented and patient cooperative  Airway & Oxygen Therapy: Patient Spontanous Breathing and Patient connected to nasal cannula oxygen  Post-op Assessment: Report given to RN, Post -op Vital signs reviewed and stable and Patient moving all extremities X 4  Post vital signs: Reviewed and stable  Last Vitals:  Vitals Value Taken Time  BP    Temp    Pulse    Resp    SpO2      Last Pain:  Vitals:   12/06/20 0826  TempSrc: Oral  PainSc: 0-No pain      Patients Stated Pain Goal: 6 (08/06/27 1188)  Complications: No complications documented.

## 2020-12-06 NOTE — H&P (Signed)
HPI: [name] is a 65yo here for followup after prostate biopsy. Biopsy revealed Gleason 3+4=7 in 1/12 cores and Gleason 3+3=6 in 1/12 cores. PSA 5.7. He has moderate LUTS and is currently not taking uroxatral 49m. Nocturia 4-5x. He has erectile dysfunction and takes sildenafil prn.    PMH:     Past Medical History:  Diagnosis Date  . Arthritis    neck and left shoulder  . Asthma   . Back pain    buldging disc  . COPD (chronic obstructive pulmonary disease) ([facility]   . Depression    takes Wellbutrin  . Diabetes mellitus    takes Metformin bid  . Emphysema   . GERD (gastroesophageal reflux disease)    takes Omeprazole daily  . Hemorrhoids   . High cholesterol    takes Pravastatin daily  . HTN (hypertension)    takes Lisinopril,Norvasc,and HCTZ daily  . IBS (irritable bowel syndrome)   . Insomnia    doesn't take any meds   . Pneumonia    hx of in 80's and 90's  . Sinus headache    sinus drainage    Surgical History:      Past Surgical History:  Procedure Laterality Date  . ANTERIOR CERVICAL DECOMP/DISCECTOMY FUSION  12/04/2011   Procedure: ANTERIOR CERVICAL DECOMPRESSION/DISCECTOMY FUSION 2 LEVELS;  Surgeon: RHosie Spangle MD;  [facility];  Service: Neurosurgery;  Laterality: N/A;  Cervical Three-Four, Cervical Four-Five anterior cervial decompression with fusion plating and bonegraft  . CARPAL TUNNEL RELEASE  10+yrs   right side  . COLONOSCOPY N/A 04/19/2014   Procedure: COLONOSCOPY;  Surgeon: NRogene Houston MD;  [facility];  Service: Endoscopy;  Laterality: N/A;  1030-moved to 1230 Ann to notify pt  . CYSTECTOMY  12-186yrago   from left side of neck    Home Medications:       Allergies as of 10/10/2020      Reactions   Sulfa Antibiotics Rash         Medication List       Accurate as of October 10, 2020  4:25 PM. If you have any questions, ask your nurse or doctor.         Accu-Chek Aviva Plus w/Device Kit   Accu-Chek Aviva Soln   Accu-Chek Softclix Lancets lancets   albuterol 108 (90 Base) MCG/ACT inhaler Commonly known as: Ventolin HFA INHALE TWO PUFFS EVERY FOUR HOURS AS NEEDED. SHAKE WELL BEFORE EACH USE.   albuterol (2.5 MG/3ML) 0.083% nebulizer solution Commonly known as: PROVENTIL Take 3 mLs (2.5 mg total) by nebulization every 6 (six) hours as needed for wheezing or shortness of breath.   alfuzosin 10 MG 24 hr tablet Commonly known as: UROXATRAL Take 1 tablet (10 mg total) by mouth at bedtime.   amLODipine 5 MG tablet Commonly known as: NORVASC TAKE ONE (1) TABLET EACH DAY   aspirin 81 MG tablet Take 81 mg by mouth daily.   B-D SINGLE USE SWABS REGULAR Pads   cetirizine 10 MG tablet Commonly known as: ZYRTEC Take 1 tablet (10 mg total) by mouth at bedtime.   diphenoxylate-atropine 2.5-0.025 MG tablet Commonly known as: LOMOTIL Take 1 tablet by mouth 3 (three) times daily as needed.   fluconazole 200 MG tablet Commonly known as: DIFLUCAN Take 1 tablet (200 mg total) by mouth daily.   Fluticasone-Salmeterol 500-50 MCG/DOSE Aepb Commonly known as: Advair Diskus USE ONE PUFF TWICE DAILY   FREESTYLE LITE test strip Generic drug: glucose  blood USE TO TEST BLOOD GLUCOSE THREE TIMES DAILY   glipiZIDE 10 MG tablet Commonly known as: GLUCOTROL TAKE ONE AND ONE-HALF TABLETS BY MOUTH TWICE A DAY   hydrochlorothiazide 25 MG tablet Commonly known as: HYDRODIURIL TAKE ONE (1) TABLET BY MOUTH EVERY DAY   ketoconazole 2 % cream Commonly known as: NIZORAL Apply 1 application topically 2 (two) times daily.   Lantus SoloStar 100 UNIT/ML Solostar Pen Generic drug: insulin glargine 24 units qhs. May titrate up to 50 units   losartan 50 MG tablet Commonly known as: COZAAR TAKE ONE TABLET EACH DAY   omeprazole 20 MG capsule Commonly known as: PRILOSEC TAKE ONE (1) CAPSULE EACH DAY   pravastatin 40 MG  tablet Commonly known as: PRAVACHOL Take 1 tablet (40 mg total) by mouth daily.   sildenafil 20 MG tablet Commonly known as: REVATIO TAKE UP TO THREE TABLETS BY MOUTH AS NEEDED       Allergies:      Allergies  Allergen Reactions  . Sulfa Antibiotics Rash    Family History:      Family History  Problem Relation Age of Onset  . Heart disease Father   . Hypertension Father   . Heart disease Brother   . Anesthesia problems Neg Hx   . Hypotension Neg Hx   . Malignant hyperthermia Neg Hx   . Pseudochol deficiency Neg Hx     Social History:  reports that he has been smoking cigarettes. He has a 60.00 pack-year smoking history. He has never used smokeless tobacco. He reports current alcohol use. He reports that he does not use drugs.  ROS: All other review of systems were reviewed and are negative except what is noted above in HPI  Physical Exam: BP 133/76   Pulse 70   Temp 98.7 F (37.1 C)   Ht 6' (1.829 m)   Wt 225 lb (102.1 kg)   BMI 30.52 kg/m   Constitutional:  Alert and oriented, No acute distress. HEENT: [place] AT, moist mucus membranes.  Trachea midline, no masses. Cardiovascular: No clubbing, cyanosis, or edema. Respiratory: Normal respiratory effort, no increased work of breathing. GI: Abdomen is soft, nontender, nondistended, no abdominal masses GU: No CVA tenderness.  Lymph: No cervical or inguinal lymphadenopathy. Skin: No rashes, bruises or suspicious lesions. Neurologic: Grossly intact, no focal deficits, moving all 4 extremities. Psychiatric: Normal mood and affect.  Laboratory Data: Recent Labs       Lab Results  Component Value Date   WBC 6.9 03/30/2013   HGB 16.4 03/30/2013   HCT 45.8 03/30/2013   MCV 85.3 03/30/2013   PLT 247 03/30/2013      Recent Labs       Lab Results  Component Value Date   CREATININE 0.98 06/28/2020      Recent Labs       Lab Results  Component Value Date   PSA 3.68 07/07/2013       Recent Labs  No results found for: TESTOSTERONE    Recent Labs       Lab Results  Component Value Date   HGBA1C 10.4 (H) 06/28/2020      Urinalysis Labs (Brief)          Component Value Date/Time   APPEARANCEUR Clear 08/29/2020 1453   GLUCOSEU 1+ (A) 08/29/2020 1453   BILIRUBINUR Negative 08/29/2020 1453   PROTEINUR Negative 08/29/2020 1453   NITRITE Negative 08/29/2020 1453   LEUKOCYTESUR Negative 08/29/2020 1453      Recent Labs  Lab Results  Component Value Date   LABMICR 33.6 12/23/2019      Pertinent Imaging:  No results found for this or any previous visit.  No results found for this or any previous visit.  No results found for this or any previous visit.  No results found for this or any previous visit.  No results found for this or any previous visit.  No results found for this or any previous visit.  No results found for this or any previous visit.  No results found for this or any previous visit.   Assessment & Plan:    1. Prostate cancer [facility]) I discussed the natural history of favorable intermediate risk prostate cancer with the patient and the various treatment options including active surveillance, RALP, IMRT, brachytherapy, cryotherapy, HIFU and ADT. After discussing the options the patient elects for IMRT. We discussed fiducial marker placement and SpaceOAR and after discussingthe procedure the patient wishes to proceed with surgery. Risks/benefits/alterantives discussed  No follow-ups on file.  Nicolette Bang, MD  [facility] [place]

## 2020-12-06 NOTE — Telephone Encounter (Signed)
Nurses  May have 2 rf Needs ov this spring!!!please

## 2020-12-06 NOTE — Op Note (Addendum)
PRE-OPERATIVE DIAGNOSIS:  Adenocarcinoma of the prostate  POST-OPERATIVE DIAGNOSIS:  Same  PROCEDURE: 1. Prostate Ultrasound 2. Placement of fiducial marks 3. Placement of SpaceOAR  SURGEON:  Surgeon(s): Nicolette Bang, MD  ANESTHESIA:  General  EBL:  Minimal  DRAINS: none  FINDINGS: 39.9cc prostate on [place]. Placement of fiducial at right base medial, right apex medial and left mid lateral prostate  INDICATION: Roger Phillips is a 65 year old with a history of T1c prostate cancer who is scheduled to undergo IMRT. He wishes to have fiducial markers and SpaceOAR placed prior to IMRT to decrease rectal toxicity.  Description of procedure: After informed consent the patient was brought to the major OR, placed on the table and administered general anesthesia. He was then moved to the modified lithotomy position with his perineum perpendicular to the floor. His perineum and genitalia were then sterilely prepped. An official timeout was then performed. The transrectal ultrasound probe was placed in the rectum and affixed to the stand. He was then sterilely draped.  A transrectal ultrasound of the prostate was performed.  Lidocaine  was not  instilled using ultrasound guidance into the junction of each seminal vesicle of the prostate.  3 Gold markers were placed into the prostate using the standard template and ultrasound guidance.  Accurate placement of the markers was confirmed.  We then proceeded to mix the SpaceOAR using the kit supplied from the manufacturer. Once this was complete we placed a sinal needle into the perirectal fat between the rectum and the prostate. Once this was accomplished we injected 2cc of normal saline to hydrodissect the plain. We then instilled the the SpaceOAR through the spinal needle and noted good distribution in the perirectal fat.   The patient was awakened and taken to recovery room in stable and satisfactory condition. He tolerated procedure well and there were  no intraoperative complications.  CONDITION: Stable, extubated, transferred to PACU  PLAN: The patient is to be discharged home and he will start IMRT in the next 2-3 weeks

## 2020-12-06 NOTE — Discharge Instructions (Signed)
Transrectal Ultrasound-Guided Prostate Gold Seed Placement, Care After This sheet gives you information about how to care for yourself after your procedure. Your health care provider may also give you more specific instructions. If you have problems or questions, contact your health care provider. What can I expect after the procedure? After the procedure, it is common to have:  Light bleeding from the rectum.  Bruising or tenderness in the area behind the scrotum (perineum).  Small amounts of blood in your urine. This should only last for a couple of days.  Light brown or red semen. This may last for a couple of weeks. Follow these instructions at home: Medicines  Take over-the-counter and prescription medicines only as told by your health care provider.  If you were prescribed an antibiotic medicine, take it as told by your health care provider. Do not stop taking the antibiotic even if you start to feel better.   Activity  Do not drive for 24 hours if you were given a medicine to help you relax (sedative) during your procedure.  Do not drive or use heavy machinery while taking prescription pain medicine.  Return to your normal activities as told by your health care provider. Ask your health care provider what activities are safe for you.  Ask your health care provider when it is safe for you to resume sexual activity. General instructions  Do not take baths, swim, or use a hot tub until your health care provider approves.  Drink enough water to keep your urine pale yellow.  Plan to have a responsible adult care for you for at least 24 hours after you leave the hospital or clinic. This is important.  Keep all follow-up visits as told by your health care provider. This is important. Managing pain, stiffness, and swelling  If directed, put ice on the affected area: ? Put ice in a plastic bag. ? Place a towel between your skin and the bag. ? Leave the ice on for 20 minutes,  2-3 times a day.  Try not to sit directly on the area behind the scrotum. A soft cushion can help with discomfort. Contact a health care provider if:  You have a fever or chills.  You have more blood in your urine instead of less.  You have blood in your urine for more than 2-3 days after the procedure.  You have trouble urinating or having a bowel movement.  You have pain or burning when urinating.  You have nausea or you vomit. Get help right away if:  You have severe pain that does not get better with medicine.  Your urine is bright red.  You cannot urinate.  You have rectal bleeding that gets worse.  You have shortness of breath. Summary  After the procedure, you may have blood in your urine and light bleeding from the rectum.  Return to your normal activities as told by your health care provider. Ask your health care provider what activities are safe for you.  Take over-the-counter and prescription medicines only as told by your health care provider.  Contact your health care provider right away if your urine is bright red or you cannot urinate. This information is not intended to replace advice given to you by your health care provider. Make sure you discuss any questions you have with your health care provider. Document Revised: 06/08/2020 Document Reviewed: 06/08/2020 Elsevier Patient Education  2021 [place].    Tramadol tablets What is this medicine? TRAMADOL (TRA ma dole)  is a pain reliever. It is used to treat severe pain. This medicine may be used for other purposes; ask your health care provider or pharmacist if you have questions. COMMON BRAND NAME(S): Ultram What should I tell my health care provider before I take this medicine? They need to know if you have any of these conditions:  brain tumor  drug abuse or addiction  head injury  if you often drink alcohol  kidney disease  liver disease  lung disease, asthma, or breathing  problems  seizures  stomach or intestine problems  suicidal thoughts, plans or attempt; a previous suicide attempt by you or a family member  taken an MAOI like Marplan, Nardil, or Parnate in the last 14 days  an unusual allergic reaction to tramadol, other medicines, foods, dyes, or preservative  pregnant or trying to get pregnant  breast-feeding How should I use this medicine? Take this medicine by mouth with a full glass of water. Follow the directions on the prescription label. You can take it with or without food. If it upsets your stomach, take it with food. Do not take your medicine more often than directed. A special MedGuide will be given to you by the pharmacist with each prescription and refill. Be sure to read this information carefully each time. Talk to your pediatrician regarding the use of this medicine in children. Special care may be needed. Overdosage: If you think you have taken too much of this medicine contact a poison control center or emergency room at once. NOTE: This medicine is only for you. Do not share this medicine with others. What if I miss a dose? If you miss a dose, take it as soon as you can. If it is almost time for your next dose, take only that dose. Do not take double or extra doses. What may interact with this medicine? Do not take this medicine with any of the following medications:  linezolid  MAOIs like Marplan, Nardil, and Parnate  methylene blue  ozanimod This medicine may also interact with the following medications:  alcohol  antihistamines for allergy, cough, and cold  atropine  certain antibiotics like erythromycin, clarithromycin, rifampin  certain antivirals for HIV or hepatitis  certain medicines for anxiety or sleep  certain medicines for bladder problems like oxybutynin, tolterodine  certain medicines for depression like amitriptyline, bupropion, fluoxetine, paroxetine, sertraline  certain medicines for fungal  infections like ketoconazole, itraconazole, or posaconazole  certain medicines for migraine headache like almotriptan, eletriptan, frovatriptan, naratriptan, rizatriptan, sumatriptan, zolmitriptan  certain medicines for Parkinson's disease like benztropine, trihexyphenidyl  certain medicines for seizures like carbamazepine, phenobarbital, primidone  certain medicines for stomach problems like dicyclomine, hyoscyamine  certain medicines for travel sickness like scopolamine  digoxin  diuretics  general anesthetics like halothane, isoflurane, methoxyflurane, propofol  ipratropium  medicines that relax muscles for surgery  other narcotic medicines for pain  phenothiazines like chlorpromazine, mesoridazine, prochlorperazine, thioridazine  quinidine  warfarin This list may not describe all possible interactions. Give your health care provider a list of all the medicines, herbs, non-prescription drugs, or dietary supplements you use. Also tell them if you smoke, drink alcohol, or use illegal drugs. Some items may interact with your medicine. What should I watch for while using this medicine? Tell your health care provider if your pain does not go away, if it gets worse, or if you have new or a different type of pain. You may develop tolerance to this drug. Tolerance means that you will need  a higher dose of the drug for pain relief. Tolerance is normal and is expected if you take this drug for a long time. Do not suddenly stop taking your drug because you may develop a severe reaction. Your body becomes used to the drug. This does NOT mean you are addicted. Addiction is a behavior related to getting and using a drug for a nonmedical reason. If you have pain, you have a medical reason to take pain drug. Your health care provider will tell you how much drug to take. If your health care provider wants you to stop the drug, the dose will be slowly lowered over time to avoid any side effects. If  you take other drugs that also cause drowsiness like other narcotic pain drugs, benzodiazepines, or other drugs for sleep, you may have more side effects. Give your health care provider a list of all drugs you use. He or she will tell you how much drug to take. Do not take more drug than directed. Get emergency help right away if you have trouble breathing or are unusually tired or sleepy. Talk to your health care provider about naloxone and how to get it. Naloxone is an emergency drug used for an opioid overdose. An overdose can happen if you take too much opioid. It can also happen if an opioid is taken with some other drugs or substances, like alcohol. Know the symptoms of an overdose, like trouble breathing, unusually tired or sleepy, or not being able to respond or wake up. Make sure to tell caregivers and close contacts where it is stored. Make sure they know how to use it. After naloxone is given, you must get emergency help right away. Naloxone is a temporary treatment. Repeat doses may be needed. This drug may cause serious skin reactions. They can happen weeks to months after starting the drug. Contact your health care provider right away if you notice fevers or flu-like symptoms with a rash. The rash may be red or purple and then turn into blisters or peeling of the skin. Or, you might notice a red rash with swelling of the face, lips, or lymph nodes in your neck or under your arms. You may get drowsy or dizzy. Do not drive, use machinery, or do anything that needs mental alertness until you know how this drug affects you. Do not stand up or sit up quickly, especially if you are an older patient. This reduces the risk of dizzy or fainting spells. Alcohol may interfere with the effect of this drug. Avoid alcoholic drinks. This drug will cause constipation. If you do not have a bowel movement for 3 days, call your health care provider. Your mouth may get dry. Chewing sugarless gum or sucking hard candy  and drinking plenty of water may help. Contact your health care provider if the problem does not go away or is severe. What side effects may I notice from receiving this medicine? Side effects that you should report to your doctor or health care professional as soon as possible:  allergic reactions (skin rash, itching or hives; swelling of the face, lips, or tongue)  confusion  low adrenal gland function (nausea; vomiting; loss of appetite; unusually weak or tired; dizziness; low blood pressure)  low blood pressure (dizziness; feeling faint or lightheaded, falls; unusually weak or tired)  low blood sugar (feeling anxious; confusion, dizziness, increased hunger; unusually weak or tired; increased sweating; shakiness; cold, clammy skin; irritable; headache; blurred vision; fast heartbeat; loss of consciousness)  low sodium level (muscle weakness, fatigue, dizziness, headache, confusion)  redness, blistering, peeling or loosening of the skin, including inside the mouth  seizures  serotonin syndrome (irritable; confusion; diarrhea; fast or irregular heartbeat; muscle twitching; stiff muscles; trouble walking; sweating; high fever; seizures; chills; vomiting)  trouble breathing Side effects that usually do not require medical attention (report to your doctor or health care professional if they continue or are bothersome):  constipation  dry mouth  nausea, vomiting  tiredness This list may not describe all possible side effects. Call your doctor for medical advice about side effects. You may report side effects to FDA at 1-800-FDA-1088. Where should I keep my medicine? Keep out of the reach of children and pets. This medicine can be abused. Keep it in a safe place to protect it from theft. Do not share it with anyone. It is only for you. Selling or giving away this medicine is dangerous and against the law. Store at room temperature between 20 and 25 degrees C (68 and 77 degrees F). Get  rid of any unused medicine after the expiration date. This medicine may cause harm and death if it is taken by other adults, children, or pets. It is important to get rid of the medicine as soon as you no longer need it or it is expired. You can do this in two ways:  Take the medicine to a medicine take-back program. Check with your pharmacy or law enforcement to find a location.  If you cannot return the medicine, check the label or package insert to see if the medicine should be thrown out in the garbage or flushed down the toilet. If you are not sure, ask your health care provider. If it is safe to put it in the trash, take the medicine out of the container. Mix the medicine with cat litter, dirt, coffee grounds, or other unwanted substance. Seal the mixture in a bag or container. Put it in the trash. NOTE: This sheet is a summary. It may not cover all possible information. If you have questions about this medicine, talk to your doctor, pharmacist, or health care provider.  2021 Elsevier/Gold Standard (2020-08-07 13:35:28)

## 2020-12-07 ENCOUNTER — Encounter (HOSPITAL_COMMUNITY): Payer: Self-pay | Admitting: Urology

## 2020-12-07 ENCOUNTER — Encounter: Payer: Self-pay | Admitting: *Deleted

## 2020-12-07 ENCOUNTER — Other Ambulatory Visit (HOSPITAL_COMMUNITY): Payer: Self-pay | Admitting: Urology

## 2020-12-07 DIAGNOSIS — C61 Malignant neoplasm of prostate: Secondary | ICD-10-CM | POA: Diagnosis not present

## 2020-12-07 DIAGNOSIS — K589 Irritable bowel syndrome without diarrhea: Secondary | ICD-10-CM | POA: Diagnosis not present

## 2020-12-07 DIAGNOSIS — Z7982 Long term (current) use of aspirin: Secondary | ICD-10-CM | POA: Diagnosis not present

## 2020-12-07 DIAGNOSIS — E119 Type 2 diabetes mellitus without complications: Secondary | ICD-10-CM | POA: Diagnosis not present

## 2020-12-07 DIAGNOSIS — I10 Essential (primary) hypertension: Secondary | ICD-10-CM | POA: Diagnosis not present

## 2020-12-07 DIAGNOSIS — Z794 Long term (current) use of insulin: Secondary | ICD-10-CM | POA: Diagnosis not present

## 2020-12-07 DIAGNOSIS — Z87891 Personal history of nicotine dependence: Secondary | ICD-10-CM | POA: Diagnosis not present

## 2020-12-07 DIAGNOSIS — R300 Dysuria: Secondary | ICD-10-CM | POA: Diagnosis not present

## 2020-12-10 ENCOUNTER — Other Ambulatory Visit (HOSPITAL_COMMUNITY): Payer: Self-pay | Admitting: Urology

## 2020-12-10 DIAGNOSIS — C61 Malignant neoplasm of prostate: Secondary | ICD-10-CM

## 2020-12-12 DIAGNOSIS — C61 Malignant neoplasm of prostate: Secondary | ICD-10-CM | POA: Diagnosis not present

## 2020-12-21 ENCOUNTER — Ambulatory Visit (INDEPENDENT_AMBULATORY_CARE_PROVIDER_SITE_OTHER): Payer: HMO | Admitting: Urology

## 2020-12-21 ENCOUNTER — Encounter: Payer: Self-pay | Admitting: Urology

## 2020-12-21 ENCOUNTER — Other Ambulatory Visit: Payer: Self-pay

## 2020-12-21 VITALS — BP 133/77 | HR 73 | Temp 98.6°F | Ht 72.0 in | Wt 219.0 lb

## 2020-12-21 DIAGNOSIS — N401 Enlarged prostate with lower urinary tract symptoms: Secondary | ICD-10-CM | POA: Diagnosis not present

## 2020-12-21 DIAGNOSIS — N138 Other obstructive and reflux uropathy: Secondary | ICD-10-CM | POA: Diagnosis not present

## 2020-12-21 DIAGNOSIS — C61 Malignant neoplasm of prostate: Secondary | ICD-10-CM

## 2020-12-21 DIAGNOSIS — R351 Nocturia: Secondary | ICD-10-CM | POA: Diagnosis not present

## 2020-12-21 LAB — URINALYSIS, ROUTINE W REFLEX MICROSCOPIC
Bilirubin, UA: NEGATIVE
Ketones, UA: NEGATIVE
Leukocytes,UA: NEGATIVE
Nitrite, UA: NEGATIVE
Protein,UA: NEGATIVE
RBC, UA: NEGATIVE
Specific Gravity, UA: 1.025 (ref 1.005–1.030)
Urobilinogen, Ur: 0.2 mg/dL (ref 0.2–1.0)
pH, UA: 5.5 (ref 5.0–7.5)

## 2020-12-21 NOTE — Progress Notes (Signed)
12/21/2020 2:13 PM   Roger Phillips 24-Mar-1956 [id number]  Referring provider: Kathyrn Drown, MD [facility],  [place] [address]  followup prostate cancer  HPI: Roger Phillips is a 31HL here for followup for prostate cancer. He underwent fiducial marker and SpaceOAR placement 2 weeks ago. No pelvic or perineal pain. No worsening LUTS. No other complaints today. He has not started IMRT   PMH: Past Medical History:  Diagnosis Date  . Arthritis    neck and left shoulder  . Asthma   . Back pain    buldging disc  . Complication of anesthesia    difficult to wake up after neck surgery  . COPD (chronic obstructive pulmonary disease) ([place])   . Depression    takes Wellbutrin  . Diabetes mellitus    takes Metformin bid  . Emphysema   . GERD (gastroesophageal reflux disease)    takes Omeprazole daily  . Hemorrhoids   . High cholesterol    takes Pravastatin daily  . HTN (hypertension)    takes Lisinopril,Norvasc,and HCTZ daily  . IBS (irritable bowel syndrome)   . Insomnia    doesn't take any meds   . Pneumonia    hx of in 80's and 90's  . Prostate cancer ([place])   . Sinus headache    sinus drainage    Surgical History: Past Surgical History:  Procedure Laterality Date  . ANTERIOR CERVICAL DECOMP/DISCECTOMY FUSION  12/04/2011   Procedure: ANTERIOR CERVICAL DECOMPRESSION/DISCECTOMY FUSION 2 LEVELS;  Surgeon: Hosie Spangle, MD;  Location: [facility];  Service: Neurosurgery;  Laterality: N/A;  Cervical Three-Four, Cervical Four-Five anterior cervial decompression with fusion plating and bonegraft  . CARPAL TUNNEL RELEASE  10+yrs   right side  . COLONOSCOPY N/A 04/19/2014   Procedure: COLONOSCOPY;  Surgeon: Rogene Houston, MD;  [facility]: [facility];  Service: Endoscopy;  Laterality: N/A;  1030-moved to 1230 Ann to notify pt  . CYSTECTOMY  12-44yr ago   from left side of neck  . GOLD SEED IMPLANT N/A 12/06/2020   Procedure: GOLD SEED IMPLANT;   Surgeon: MCleon Gustin MD;  Location: [facility];  Service: Urology;  Laterality: N/A;  . SPACE OAR INSTILLATION N/A 12/06/2020   Procedure: SPACE OAR INSTILLATION;  Surgeon: MCleon Gustin MD;  Location: [facility];  Service: Urology;  Laterality: N/A;    Home Medications:  Allergies as of 12/21/2020      Reactions   Morphine And Related Other (See Comments)   Insomnia   Sulfa Antibiotics Rash      Medication List       Accurate as of December 21, 2020  2:13 PM. If you have any questions, ask your nurse or doctor.        Accu-Chek Aviva Plus w/Device Kit   Accu-Chek Aviva Soln   Accu-Chek Softclix Lancets lancets   freestyle lancets Use to check blood sugar TID   albuterol 108 (90 Base) MCG/ACT inhaler Commonly known as: Ventolin HFA INHALE TWO PUFFS EVERY FOUR HOURS AS NEEDED. SHAKE WELL BEFORE EACH USE.   alfuzosin 10 MG 24 hr tablet Commonly known as: UROXATRAL Take 1 tablet (10 mg total) by mouth at bedtime.   amLODipine 5 MG tablet Commonly known as: NORVASC TAKE ONE (1) TABLET EACH DAY   aspirin 81 MG tablet Take 81 mg by mouth daily.   B-D SINGLE USE SWABS REGULAR Pads   cetirizine 10 MG tablet Commonly known as: ZYRTEC Take 1  tablet (10 mg total) by mouth at bedtime.   diphenoxylate-atropine 2.5-0.025 MG tablet Commonly known as: LOMOTIL Take 1 tablet by mouth 3 (three) times daily as needed.   Fluticasone-Salmeterol 500-50 MCG/DOSE Aepb Commonly known as: Advair Diskus USE ONE PUFF TWICE DAILY   FREESTYLE LITE test strip Generic drug: glucose blood USE TO TEST BLOOD GLUCOSE THREE TIMES DAILY   glipiZIDE 10 MG tablet Commonly known as: GLUCOTROL TAKE ONE AND ONE-HALF TABLETS BY MOUTH TWICE A DAY   hydrochlorothiazide 25 MG tablet Commonly known as: HYDRODIURIL TAKE ONE (1) TABLET BY MOUTH EVERY DAY   ketoconazole 2 % cream Commonly known as: NIZORAL Apply 1 application topically 2 (two) times daily.   Lantus SoloStar 100 UNIT/ML  Solostar Pen Generic drug: insulin glargine 24 units qhs. May titrate up to 50 units   losartan 50 MG tablet Commonly known as: COZAAR TAKE ONE TABLET EACH DAY   omeprazole 20 MG capsule Commonly known as: PRILOSEC TAKE ONE (1) CAPSULE EACH DAY   pravastatin 40 MG tablet Commonly known as: PRAVACHOL Take 1 tablet (40 mg total) by mouth daily.   sildenafil 20 MG tablet Commonly known as: REVATIO TAKE UP TO THREE TABLETS BY MOUTH AS NEEDED   traMADol 50 MG tablet Commonly known as: Ultram Take 1 tablet (50 mg total) by mouth every 6 (six) hours as needed.       Allergies:  Allergies  Allergen Reactions  . Morphine And Related Other (See Comments)    Insomnia   . Sulfa Antibiotics Rash    Family History: Family History  Problem Relation Age of Onset  . Heart disease Father   . Hypertension Father   . Heart disease Brother   . Anesthesia problems Neg Hx   . Hypotension Neg Hx   . Malignant hyperthermia Neg Hx   . Pseudochol deficiency Neg Hx   . Breast cancer Neg Hx   . Prostate cancer Neg Hx   . Colon cancer Neg Hx   . Pancreatic cancer Neg Hx     Social History:  reports that he has been smoking cigarettes. He has a 60.00 pack-year smoking history. He has never used smokeless tobacco. He reports current alcohol use. He reports that he does not use drugs.  ROS: All other review of systems were reviewed and are negative except what is noted above in HPI  Physical Exam: BP 133/77   Pulse 73   Temp 98.6 F (37 C)   Ht 6' (1.829 m)   Wt 219 lb (99.3 kg)   BMI 29.70 kg/m   Constitutional:  Alert and oriented, No acute distress. HEENT: [place] AT, moist mucus membranes.  Trachea midline, no masses. Cardiovascular: No clubbing, cyanosis, or edema. Respiratory: Normal respiratory effort, no increased work of breathing. GI: Abdomen is soft, nontender, nondistended, no abdominal masses GU: No CVA tenderness.  Lymph: No cervical or inguinal lymphadenopathy. Skin:  No rashes, bruises or suspicious lesions. Neurologic: Grossly intact, no focal deficits, moving all 4 extremities. Psychiatric: Normal mood and affect.  Laboratory Data: Lab Results  Component Value Date   WBC 6.9 03/30/2013   HGB 16.4 03/30/2013   HCT 45.8 03/30/2013   MCV 85.3 03/30/2013   PLT 247 03/30/2013    Lab Results  Component Value Date   CREATININE 0.85 12/04/2020    Lab Results  Component Value Date   PSA 3.68 07/07/2013    No results found for: TESTOSTERONE  Lab Results  Component Value Date   HGBA1C  9.9 (H) 12/04/2020    Urinalysis    Component Value Date/Time   APPEARANCEUR Clear 08/29/2020 1453   GLUCOSEU 1+ (A) 08/29/2020 1453   BILIRUBINUR Negative 08/29/2020 1453   PROTEINUR Negative 08/29/2020 1453   NITRITE Negative 08/29/2020 1453   LEUKOCYTESUR Negative 08/29/2020 1453    Lab Results  Component Value Date   LABMICR 33.6 12/23/2019    Pertinent Imaging:  No results found for this or any previous visit.  No results found for this or any previous visit.  No results found for this or any previous visit.  No results found for this or any previous visit.  No results found for this or any previous visit.  No results found for this or any previous visit.  No results found for this or any previous visit.  No results found for this or any previous visit.   Assessment & Plan:    1. Benign prostatic hyperplasia with urinary obstruction -patient defers therapy at this time - Urinalysis, Routine w reflex microscopic  2. Nocturia -fluid management. Decrease caffeine intake prior to bed and decrease fluid intake.   Return in about 3 months (around 03/23/2021) for PSA.  Nicolette Bang, MD  [facility] [place]

## 2020-12-21 NOTE — Progress Notes (Signed)
Urological Symptom Review  Patient is experiencing the following symptoms: Frequent urination Hard to postpone urination Get up at night to urinate Stream starts and stops Trouble starting stream Have to strain to urinate Urinary tract infection Injury to kidneys/bladder Weak stream Erection problems (male only)   Review of Systems  Gastrointestinal (upper)  : Indigestion/heartburn  Gastrointestinal (lower) : Diarrhea  Constitutional : Negative for symptoms  Skin: Negative for skin symptoms  Eyes: Negative for eye symptoms  Ear/Nose/Throat : Sinus problems  Hematologic/Lymphatic: Easy bruising  Cardiovascular : Negative for cardiovascular symptoms  Respiratory : Cough  Endocrine: Negative for endocrine symptoms  Musculoskeletal: Back pain  Neurological: Negative for neurological symptoms  Psychologic: Negative for psychiatric symptoms

## 2020-12-21 NOTE — Patient Instructions (Signed)

## 2020-12-25 DIAGNOSIS — C61 Malignant neoplasm of prostate: Secondary | ICD-10-CM | POA: Diagnosis not present

## 2020-12-26 DIAGNOSIS — C61 Malignant neoplasm of prostate: Secondary | ICD-10-CM | POA: Diagnosis not present

## 2020-12-27 DIAGNOSIS — C61 Malignant neoplasm of prostate: Secondary | ICD-10-CM | POA: Diagnosis not present

## 2020-12-28 DIAGNOSIS — C61 Malignant neoplasm of prostate: Secondary | ICD-10-CM | POA: Diagnosis not present

## 2020-12-31 DIAGNOSIS — C61 Malignant neoplasm of prostate: Secondary | ICD-10-CM | POA: Diagnosis not present

## 2021-01-01 ENCOUNTER — Other Ambulatory Visit: Payer: Self-pay

## 2021-01-01 ENCOUNTER — Encounter: Payer: Self-pay | Admitting: Family Medicine

## 2021-01-01 ENCOUNTER — Encounter: Payer: Self-pay | Admitting: Urology

## 2021-01-01 ENCOUNTER — Ambulatory Visit (INDEPENDENT_AMBULATORY_CARE_PROVIDER_SITE_OTHER): Payer: HMO | Admitting: Family Medicine

## 2021-01-01 VITALS — BP 136/84 | HR 90 | Temp 97.6°F | Ht 72.0 in | Wt 218.6 lb

## 2021-01-01 DIAGNOSIS — E1169 Type 2 diabetes mellitus with other specified complication: Secondary | ICD-10-CM

## 2021-01-01 DIAGNOSIS — Z122 Encounter for screening for malignant neoplasm of respiratory organs: Secondary | ICD-10-CM | POA: Diagnosis not present

## 2021-01-01 DIAGNOSIS — J438 Other emphysema: Secondary | ICD-10-CM

## 2021-01-01 DIAGNOSIS — R300 Dysuria: Secondary | ICD-10-CM

## 2021-01-01 DIAGNOSIS — E785 Hyperlipidemia, unspecified: Secondary | ICD-10-CM | POA: Diagnosis not present

## 2021-01-01 DIAGNOSIS — C61 Malignant neoplasm of prostate: Secondary | ICD-10-CM

## 2021-01-01 DIAGNOSIS — E1165 Type 2 diabetes mellitus with hyperglycemia: Secondary | ICD-10-CM

## 2021-01-01 MED ORDER — INSULIN ASPART 100 UNIT/ML FLEXPEN: PEN_INJECTOR | SUBCUTANEOUS | 5 refills | Status: DC

## 2021-01-01 MED ORDER — DIPHENOXYLATE-ATROPINE 2.5-0.025 MG PO TABS: ORAL_TABLET | ORAL | 1 refills | Status: DC

## 2021-01-01 MED ORDER — HYDROCHLOROTHIAZIDE 25 MG PO TABS: ORAL_TABLET | ORAL | 1 refills | Status: DC

## 2021-01-01 MED ORDER — LANTUS SOLOSTAR 100 UNIT/ML ~~LOC~~ SOPN: PEN_INJECTOR | SUBCUTANEOUS | 5 refills | Status: DC

## 2021-01-01 MED ORDER — AMLODIPINE BESYLATE 5 MG PO TABS: ORAL_TABLET | ORAL | 1 refills | Status: DC

## 2021-01-01 MED ORDER — PRAVASTATIN SODIUM 40 MG PO TABS: 40.0000 mg | ORAL_TABLET | Freq: Every day | ORAL | 1 refills | Status: DC

## 2021-01-01 MED ORDER — CEPHALEXIN 500 MG PO CAPS: 500.0000 mg | ORAL_CAPSULE | Freq: Three times a day (TID) | ORAL | 0 refills | Status: DC

## 2021-01-01 MED ORDER — LOSARTAN POTASSIUM 50 MG PO TABS: ORAL_TABLET | ORAL | 1 refills | Status: DC

## 2021-01-01 NOTE — Progress Notes (Signed)
Subjective:    Patient ID: Roger Phillips, male    DOB: 09-27-1956, 65 y.o.   MRN: [id number]  Diabetes He presents for his follow-up diabetic visit. He has type 2 diabetes mellitus. Pertinent negatives for hypoglycemia include no confusion, dizziness or headaches. Pertinent negatives for diabetes include no chest pain and no fatigue. Risk factors for coronary artery disease include hypertension and dyslipidemia. Current diabetic treatment includes insulin injections and oral agent (monotherapy). He is compliant with treatment all of the time. His weight is stable. He is following a diabetic diet.   Dysuria - Plan: Urine Culture  Uncontrolled type 2 diabetes mellitus with hyperglycemia ([facility])  Other emphysema (H[facility])  Prostate cancer ([place])  Hyperlipidemia associated with type 2 diabetes mellitus ([place])      Review of Systems  Constitutional: Negative for diaphoresis and fatigue.  HENT: Negative for congestion and rhinorrhea.   Respiratory: Negative for cough and shortness of breath.   Cardiovascular: Negative for chest pain and leg swelling.  Gastrointestinal: Negative for abdominal pain and diarrhea.  Genitourinary: Positive for dysuria.  Skin: Negative for color change and rash.  Neurological: Negative for dizziness and headaches.  Psychiatric/Behavioral: Negative for behavioral problems and confusion.       Objective:   Physical Exam Vitals reviewed.  Constitutional:      General: He is not in acute distress. HENT:     Head: Normocephalic and atraumatic.  Eyes:     General:        Right eye: No discharge.        Left eye: No discharge.  Neck:     Trachea: No tracheal deviation.  Cardiovascular:     Rate and Rhythm: Normal rate and regular rhythm.     Heart sounds: Normal heart sounds. No murmur heard.   Pulmonary:     Effort: Pulmonary effort is normal. No respiratory distress.     Breath sounds: Normal breath sounds.  Lymphadenopathy:     Cervical: No  cervical adenopathy.  Skin:    General: Skin is warm and dry.  Neurological:     Mental Status: He is alert.     Coordination: Coordination normal.  Psychiatric:        Behavior: Behavior normal.           Assessment & Plan:  1. Dysuria This is more than related to the radiation he is getting for the prostate cancer we will go ahead and do a urine culture short course of antibiotics he is using over-the-counter Azo-Standard may need to speak with his urologist I encouraged him to reach out to him to see if there is something different they can try - Urine Culture  2. Uncontrolled type 2 diabetes mellitus with hyperglycemia ([place]) Subpar control of diabetes he needs to do short acting insulin with meals we will start off 8 units with each meal plus also long-acting 40 units titrate upward wife will record his sugar readings over the next 7 to 10 days and send them to [place] Stop glipizide  3. Other emphysema ([place]) Significant emphysema but unfortunately patient continues to smoke unlikely to ever quit we have encouraged him to quit Lung function testing would not be of any benefit right now CT scan of the lungs to rule out lung cancer as part of screening would be a good idea we will reach out to him to get this scheduled  4. Prostate cancer [facility]) He is being followed by urology for this  5. Hyperlipidemia  associated with type 2 diabetes mellitus ([place]) On medication did not tolerate stronger dosing currently on pravastatin 40 Follow-up 3 months

## 2021-01-02 ENCOUNTER — Other Ambulatory Visit: Payer: Self-pay

## 2021-01-02 DIAGNOSIS — R300 Dysuria: Secondary | ICD-10-CM

## 2021-01-02 DIAGNOSIS — C61 Malignant neoplasm of prostate: Secondary | ICD-10-CM | POA: Diagnosis not present

## 2021-01-02 MED ORDER — PHENAZOPYRIDINE HCL 100 MG PO TABS: ORAL_TABLET | ORAL | 0 refills | Status: DC

## 2021-01-02 MED ORDER — URIBEL 118 MG PO CAPS: ORAL_CAPSULE | ORAL | 0 refills | Status: DC

## 2021-01-02 NOTE — Progress Notes (Signed)
Low dose CT ordered and my chart message sent to patient.

## 2021-01-02 NOTE — Addendum Note (Signed)
Addended by: Vicente Males on: 01/02/2021 08:18 AM   Modules accepted: Orders

## 2021-01-03 DIAGNOSIS — C61 Malignant neoplasm of prostate: Secondary | ICD-10-CM | POA: Diagnosis not present

## 2021-01-04 DIAGNOSIS — Z87891 Personal history of nicotine dependence: Secondary | ICD-10-CM | POA: Diagnosis not present

## 2021-01-04 DIAGNOSIS — K589 Irritable bowel syndrome without diarrhea: Secondary | ICD-10-CM | POA: Diagnosis not present

## 2021-01-04 DIAGNOSIS — I10 Essential (primary) hypertension: Secondary | ICD-10-CM | POA: Diagnosis not present

## 2021-01-04 DIAGNOSIS — E119 Type 2 diabetes mellitus without complications: Secondary | ICD-10-CM | POA: Diagnosis not present

## 2021-01-04 DIAGNOSIS — R300 Dysuria: Secondary | ICD-10-CM | POA: Diagnosis not present

## 2021-01-04 DIAGNOSIS — Z7982 Long term (current) use of aspirin: Secondary | ICD-10-CM | POA: Diagnosis not present

## 2021-01-04 DIAGNOSIS — C61 Malignant neoplasm of prostate: Secondary | ICD-10-CM | POA: Diagnosis not present

## 2021-01-04 DIAGNOSIS — Z794 Long term (current) use of insulin: Secondary | ICD-10-CM | POA: Diagnosis not present

## 2021-01-04 LAB — URINE CULTURE

## 2021-01-07 DIAGNOSIS — C61 Malignant neoplasm of prostate: Secondary | ICD-10-CM | POA: Diagnosis not present

## 2021-01-08 ENCOUNTER — Other Ambulatory Visit: Payer: Self-pay | Admitting: Family Medicine

## 2021-01-08 DIAGNOSIS — C61 Malignant neoplasm of prostate: Secondary | ICD-10-CM | POA: Diagnosis not present

## 2021-01-09 ENCOUNTER — Other Ambulatory Visit: Payer: Self-pay

## 2021-01-09 DIAGNOSIS — C61 Malignant neoplasm of prostate: Secondary | ICD-10-CM | POA: Diagnosis not present

## 2021-01-09 DIAGNOSIS — N138 Other obstructive and reflux uropathy: Secondary | ICD-10-CM

## 2021-01-09 MED ORDER — TAMSULOSIN HCL 0.4 MG PO CAPS: 0.4000 mg | ORAL_CAPSULE | Freq: Every day | ORAL | 11 refills | Status: DC

## 2021-01-09 NOTE — Progress Notes (Signed)
Wife called and informed that patient had chest pain with Alfuzosin. Per Dr. Alyson Ingles patient is to start Flomax q day. Wife notified of allergy warning and informed to notify office if patient is unable to tolerate.

## 2021-01-10 DIAGNOSIS — R300 Dysuria: Secondary | ICD-10-CM | POA: Diagnosis not present

## 2021-01-10 DIAGNOSIS — C61 Malignant neoplasm of prostate: Secondary | ICD-10-CM | POA: Diagnosis not present

## 2021-01-10 DIAGNOSIS — R350 Frequency of micturition: Secondary | ICD-10-CM | POA: Diagnosis not present

## 2021-01-10 DIAGNOSIS — R351 Nocturia: Secondary | ICD-10-CM | POA: Diagnosis not present

## 2021-01-11 DIAGNOSIS — C61 Malignant neoplasm of prostate: Secondary | ICD-10-CM | POA: Diagnosis not present

## 2021-01-14 DIAGNOSIS — C61 Malignant neoplasm of prostate: Secondary | ICD-10-CM | POA: Diagnosis not present

## 2021-01-15 DIAGNOSIS — C61 Malignant neoplasm of prostate: Secondary | ICD-10-CM | POA: Diagnosis not present

## 2021-01-16 DIAGNOSIS — C61 Malignant neoplasm of prostate: Secondary | ICD-10-CM | POA: Diagnosis not present

## 2021-01-17 DIAGNOSIS — C61 Malignant neoplasm of prostate: Secondary | ICD-10-CM | POA: Diagnosis not present

## 2021-01-21 DIAGNOSIS — C61 Malignant neoplasm of prostate: Secondary | ICD-10-CM | POA: Diagnosis not present

## 2021-01-22 DIAGNOSIS — C61 Malignant neoplasm of prostate: Secondary | ICD-10-CM | POA: Diagnosis not present

## 2021-01-23 DIAGNOSIS — C61 Malignant neoplasm of prostate: Secondary | ICD-10-CM | POA: Diagnosis not present

## 2021-01-24 DIAGNOSIS — C61 Malignant neoplasm of prostate: Secondary | ICD-10-CM | POA: Diagnosis not present

## 2021-01-25 DIAGNOSIS — C61 Malignant neoplasm of prostate: Secondary | ICD-10-CM | POA: Diagnosis not present

## 2021-01-28 DIAGNOSIS — C61 Malignant neoplasm of prostate: Secondary | ICD-10-CM | POA: Diagnosis not present

## 2021-01-29 DIAGNOSIS — C61 Malignant neoplasm of prostate: Secondary | ICD-10-CM | POA: Diagnosis not present

## 2021-01-30 DIAGNOSIS — C61 Malignant neoplasm of prostate: Secondary | ICD-10-CM | POA: Diagnosis not present

## 2021-01-31 DIAGNOSIS — C61 Malignant neoplasm of prostate: Secondary | ICD-10-CM | POA: Diagnosis not present

## 2021-02-01 DIAGNOSIS — C61 Malignant neoplasm of prostate: Secondary | ICD-10-CM | POA: Diagnosis not present

## 2021-02-03 ENCOUNTER — Telehealth: Payer: Self-pay | Admitting: Family Medicine

## 2021-02-03 NOTE — Telephone Encounter (Signed)
Should be noted that the patient was referred for CT scan of lungs for cancer detection/lung cancer screening because of current prostate cancer does not qualify see below  Roger Beckers, RN  Roger Spatz, NP; Roger Mates, RN; Roger Phillips, Roger Snare, MD This pt is currently being treated for newly diagnosed Prostate Cancer and is getting IMRT and plans for surgery after radiation. Lung cancer screening guidelines state that ipt has to be cancer free for 5 years before they can have lung cancer screening CT. So he will not qualify at this time. Please feel free to send a new referral when pt is in remission for 5 years. Thanks.  Roger Glassman, RN

## 2021-02-05 ENCOUNTER — Other Ambulatory Visit: Payer: Self-pay | Admitting: Family Medicine

## 2021-02-05 ENCOUNTER — Encounter: Payer: Self-pay | Admitting: Family Medicine

## 2021-02-05 NOTE — Telephone Encounter (Signed)
6 refills °

## 2021-02-05 NOTE — Telephone Encounter (Signed)
Seen 01/01/21 for med check up

## 2021-03-06 DIAGNOSIS — Z87891 Personal history of nicotine dependence: Secondary | ICD-10-CM | POA: Diagnosis not present

## 2021-03-06 DIAGNOSIS — R300 Dysuria: Secondary | ICD-10-CM | POA: Diagnosis not present

## 2021-03-06 DIAGNOSIS — C61 Malignant neoplasm of prostate: Secondary | ICD-10-CM | POA: Diagnosis not present

## 2021-03-06 DIAGNOSIS — K589 Irritable bowel syndrome without diarrhea: Secondary | ICD-10-CM | POA: Diagnosis not present

## 2021-03-06 DIAGNOSIS — Z7982 Long term (current) use of aspirin: Secondary | ICD-10-CM | POA: Diagnosis not present

## 2021-03-06 DIAGNOSIS — E119 Type 2 diabetes mellitus without complications: Secondary | ICD-10-CM | POA: Diagnosis not present

## 2021-03-06 DIAGNOSIS — Z794 Long term (current) use of insulin: Secondary | ICD-10-CM | POA: Diagnosis not present

## 2021-03-06 DIAGNOSIS — I10 Essential (primary) hypertension: Secondary | ICD-10-CM | POA: Diagnosis not present

## 2021-03-15 ENCOUNTER — Telehealth: Payer: Self-pay | Admitting: Family Medicine

## 2021-03-15 NOTE — Telephone Encounter (Signed)
Left message for patient to call back and schedule Medicare Annual Wellness Visit (AWV).   Please offer to do virtually or by telephone.   Last AWV:04/26/2019  Please schedule at anytime with Nurse Health Advisor.

## 2021-03-19 ENCOUNTER — Other Ambulatory Visit: Payer: Self-pay

## 2021-03-19 ENCOUNTER — Ambulatory Visit (INDEPENDENT_AMBULATORY_CARE_PROVIDER_SITE_OTHER): Payer: HMO | Admitting: Family Medicine

## 2021-03-19 ENCOUNTER — Ambulatory Visit: Payer: HMO | Admitting: Family Medicine

## 2021-03-19 VITALS — BP 120/73 | HR 78 | Temp 98.2°F | Wt 217.2 lb

## 2021-03-19 DIAGNOSIS — K219 Gastro-esophageal reflux disease without esophagitis: Secondary | ICD-10-CM

## 2021-03-19 DIAGNOSIS — R079 Chest pain, unspecified: Secondary | ICD-10-CM

## 2021-03-19 DIAGNOSIS — E1165 Type 2 diabetes mellitus with hyperglycemia: Secondary | ICD-10-CM | POA: Diagnosis not present

## 2021-03-19 MED ORDER — SUCRALFATE 1 G PO TABS: ORAL_TABLET | ORAL | 0 refills | Status: DC

## 2021-03-19 MED ORDER — OMEPRAZOLE 20 MG PO CPDR: DELAYED_RELEASE_CAPSULE | ORAL | 1 refills | Status: DC

## 2021-03-19 NOTE — Progress Notes (Signed)
   Subjective:    Patient ID: Roger Phillips, male    DOB: Jan 01, 1956, 65 y.o.   MRN: [id number]  HPI Pt having gas build up that goes into chest. Began about 3 weeks ago. Worsened after radiation for prostate cancer. Pt wanting to know if he should take stomach pill BID instead of once per day.   No other symptoms. Has been having to take lots of antacid and milk of magnesia.   Patient denies any angina with walking.  No shortness of breath.  He does state that he wakes up at night with central chest pain that will last a few minutes then go away often if he did take some Mylanta.  He wonders if he needs to increase the PPI.  On previous CAT scan patient had evidence of coronary artery disease EKG from March looked good. Review of Systems     Objective:   Physical Exam Lungs are clear heart regular abdomen is soft  EKG no acute changes     Assessment & Plan:   Central chest discomfort-EKG looks good.  What is concerning is that his CAT scan from 2019 did show coronary artery disease.  Patient feels that it is very much reflux because he relates burning in his central chest along with feeling regurgitation.  We will continue omeprazole add Pepcid in the evening.  Do a follow-up visit in approximately 14 days  We will also connect with cardiology-more than likely would benefit from a stress test but the likelihood of this being cardiac is low given that he states when he walks he does not experience any chest tightness pressure or discomfort-but he has significant risk factors including uncontrolled diabetes and years of smoking and poor health habits  Will also touch base with cancer screening to see if he qualifies given that he has had radiation for prostate cancer  Reflux add Pepcid in the evening continue omeprazole during the day if in 2 weeks he is not doing better referral to GI for EGD

## 2021-03-22 ENCOUNTER — Other Ambulatory Visit: Payer: HMO

## 2021-03-22 ENCOUNTER — Other Ambulatory Visit: Payer: Self-pay

## 2021-03-22 DIAGNOSIS — C61 Malignant neoplasm of prostate: Secondary | ICD-10-CM

## 2021-03-23 LAB — PSA: Prostate Specific Ag, Serum: 1.3 ng/mL (ref 0.0–4.0)

## 2021-03-26 ENCOUNTER — Telehealth: Payer: Self-pay | Admitting: Family Medicine

## 2021-03-26 DIAGNOSIS — R079 Chest pain, unspecified: Secondary | ICD-10-CM

## 2021-03-26 NOTE — Progress Notes (Signed)
We will go ahead with consultation with cardiology please see telephone note

## 2021-03-26 NOTE — Telephone Encounter (Signed)
Nurses  Please let patient know that I did communicate with cardiology regarding his symptoms he had the other day.  They agree with me that it would be a good idea for them to see him in order to evaluate him for cardiovascular disease.  Possibility that they may recommend a Myoview stress test.  Recommend consultation with Beckie Busing, NP at cardiology [place]

## 2021-03-27 NOTE — Progress Notes (Signed)
Cardiology referral ordered in [facility]

## 2021-03-27 NOTE — Addendum Note (Signed)
Addended by: Dairl Ponder on: 03/27/2021 03:51 PM   Modules accepted: Orders

## 2021-03-27 NOTE — Telephone Encounter (Signed)
Left message to return call 

## 2021-03-27 NOTE — Telephone Encounter (Signed)
Patient advised per Dr Nicki Reaper:  Dr Nicki Reaper communicated with cardiology regarding his symptoms he had the other day.  They agree with me that it would be a good idea for them to see him in order to evaluate him for cardiovascular disease.  Possibility that they may recommend a Myoview stress test Patient verbalized understanding and is ok proceeding with cardiology referral. Referral ordered in Epic.

## 2021-03-27 NOTE — Telephone Encounter (Signed)
Left message for patient to return call for details.

## 2021-03-29 ENCOUNTER — Other Ambulatory Visit: Payer: Self-pay

## 2021-03-29 ENCOUNTER — Ambulatory Visit (INDEPENDENT_AMBULATORY_CARE_PROVIDER_SITE_OTHER): Payer: HMO | Admitting: Urology

## 2021-03-29 ENCOUNTER — Encounter: Payer: Self-pay | Admitting: Urology

## 2021-03-29 VITALS — BP 122/76 | HR 64 | Ht 72.0 in | Wt 216.5 lb

## 2021-03-29 DIAGNOSIS — N401 Enlarged prostate with lower urinary tract symptoms: Secondary | ICD-10-CM | POA: Diagnosis not present

## 2021-03-29 DIAGNOSIS — R351 Nocturia: Secondary | ICD-10-CM | POA: Diagnosis not present

## 2021-03-29 DIAGNOSIS — N138 Other obstructive and reflux uropathy: Secondary | ICD-10-CM | POA: Diagnosis not present

## 2021-03-29 DIAGNOSIS — C61 Malignant neoplasm of prostate: Secondary | ICD-10-CM | POA: Diagnosis not present

## 2021-03-29 LAB — URINALYSIS, ROUTINE W REFLEX MICROSCOPIC
Bilirubin, UA: NEGATIVE
Ketones, UA: NEGATIVE
Leukocytes,UA: NEGATIVE
Nitrite, UA: NEGATIVE
Protein,UA: NEGATIVE
Specific Gravity, UA: 1.025 (ref 1.005–1.030)
Urobilinogen, Ur: 0.2 mg/dL (ref 0.2–1.0)
pH, UA: 6.5 (ref 5.0–7.5)

## 2021-03-29 LAB — MICROSCOPIC EXAMINATION
Bacteria, UA: NONE SEEN
Epithelial Cells (non renal): NONE SEEN /hpf (ref 0–10)
Renal Epithel, UA: NONE SEEN /hpf
WBC, UA: NONE SEEN /hpf (ref 0–5)

## 2021-03-29 NOTE — Progress Notes (Signed)
03/29/2021 1:59 PM   Roger Phillips 1956/08/09 [id number]  Referring provider: Kathyrn Drown, MD [address] [place],   [address]  Followup prostate cancer and BPH   HPI: Roger Phillips is a 13KG here for followup for prostate cancer and BPH. PSA 1.9 IPSS 11 QOL 1 on no BPH therapy. No hematuria or dysuria. Urine stream strong. Nocturia 2-3x based on fluid consumption. NO other complaints today   PMH: Past Medical History:  Diagnosis Date   Arthritis    neck and left shoulder   Asthma    Back pain    buldging disc   Complication of anesthesia    difficult to wake up after neck surgery   COPD (chronic obstructive pulmonary disease) (HCC)    Depression    takes Wellbutrin   Diabetes mellitus    takes Metformin bid   Emphysema    GERD (gastroesophageal reflux disease)    takes Omeprazole daily   Hemorrhoids    High cholesterol    takes Pravastatin daily   HTN (hypertension)    takes Lisinopril,Norvasc,and HCTZ daily   IBS (irritable bowel syndrome)    Insomnia    doesn't take any meds    Pneumonia    hx of in 80's and 90's   Prostate cancer (HCC)    Sinus headache    sinus drainage    Surgical History: Past Surgical History:  Procedure Laterality Date   ANTERIOR CERVICAL DECOMP/DISCECTOMY FUSION  12/04/2011   Procedure: ANTERIOR CERVICAL DECOMPRESSION/DISCECTOMY FUSION 2 LEVELS;  Surgeon: Hosie Spangle, MD;  Location: [place] [facility];  Service: Neurosurgery;  Laterality: N/A;  Cervical Three-Four, Cervical Four-Five anterior cervial decompression with fusion plating and bonegraft   CARPAL TUNNEL RELEASE  10+yrs   right side   COLONOSCOPY N/A 04/19/2014   Procedure: COLONOSCOPY;  Surgeon: Rogene Houston, MD;  [facility];  Service: Endoscopy;  Laterality: N/A;  1030-moved to 1230 Ann to notify pt   CYSTECTOMY  12-61yr ago   from left side of neck   GOLD SEED IMPLANT N/A 12/06/2020   Procedure: [place]  Surgeon:  MCleon Gustin MD;  Location: [facility];  Service: Urology;  Laterality: N/A;   SPACE OAR INSTILLATION N/A 12/06/2020   Procedure: SPACE OAR INSTILLATION;  Surgeon: MCleon Gustin MD;  Location: [facility];  Service: Urology;  Laterality: N/A;    Home Medications:  Allergies as of 03/29/2021       Reactions   Morphine And Related Other (See Comments)   Insomnia   Sulfa Antibiotics Rash        Medication List        Accurate as of March 29, 2021  1:59 PM. If you have any questions, ask your nurse or doctor.          Accu-Chek Aviva Plus w/Device Kit   Accu-Chek Aviva Soln   Accu-Chek Softclix Lancets lancets   freestyle lancets Use to check blood sugar TID   albuterol 108 (90 Base) MCG/ACT inhaler Commonly known as: VENTOLIN HFA INHALE TWO PUFFS INTO THE LUNGS EVERY FOUR HOURS AS NEEDED. SHAKE WELL BEFORE EACH USE.   amLODipine 5 MG tablet Commonly known as: NORVASC TAKE ONE (1) TABLET EACH DAY   aspirin 81 MG tablet Take 81 mg by mouth daily.   B-D SINGLE USE SWABS REGULAR Pads   cetirizine 10 MG tablet Commonly known as: ZYRTEC TAKE ONE TABLET (10MG) BY MOUTH AT BEDTIME  diphenoxylate-atropine 2.5-0.025 MG tablet Commonly known as: LOMOTIL One bid prn diarrhea due to prostrate radiation   Fluticasone-Salmeterol 500-50 MCG/DOSE Aepb Commonly known as: Advair Diskus USE ONE PUFF TWICE DAILY   FREESTYLE LITE test strip Generic drug: glucose blood USE TO TEST BLOOD GLUCOSE THREE TIMES DAILY   hydrochlorothiazide 25 MG tablet Commonly known as: HYDRODIURIL TAKE ONE (1) TABLET BY MOUTH EVERY DAY   insulin aspart 100 UNIT/ML FlexPen Commonly known as: NOVOLOG 8 units with lunch and dinner -may titrate up to 16 units   ketoconazole 2 % cream Commonly known as: NIZORAL Apply 1 application topically 2 (two) times daily.   Lantus SoloStar 100 UNIT/ML Solostar Pen Generic drug: insulin glargine 40 units qhs. May titrate up to 60 units   losartan  50 MG tablet Commonly known as: COZAAR TAKE ONE TABLET EACH DAY   omeprazole 20 MG capsule Commonly known as: PRILOSEC 1 po bid for gerd   phenazopyridine 100 MG tablet Commonly known as: Pyridium Take 2 to 3 pills daily as needed.   pravastatin 40 MG tablet Commonly known as: PRAVACHOL Take 1 tablet (40 mg total) by mouth daily.   sildenafil 20 MG tablet Commonly known as: REVATIO TAKE UP TO THREE TABLETS BY MOUTH AS NEEDED   sucralfate 1 g tablet Commonly known as: Carafate One tid for 30 days fpor gerd   traMADol 50 MG tablet Commonly known as: Ultram Take 1 tablet (50 mg total) by mouth every 6 (six) hours as needed.   Uribel 118 MG Caps Take 1 tablet 2 times per day prn.        Allergies:  Allergies  Allergen Reactions   Morphine And Related Other (See Comments)    Insomnia    Sulfa Antibiotics Rash    Family History: Family History  Problem Relation Age of Onset   Heart disease Father    Hypertension Father    Heart disease Brother    Anesthesia problems Neg Hx    Hypotension Neg Hx    Malignant hyperthermia Neg Hx    Pseudochol deficiency Neg Hx    Breast cancer Neg Hx    Prostate cancer Neg Hx    Colon cancer Neg Hx    Pancreatic cancer Neg Hx     Social History:  reports that he has been smoking cigarettes. He has a 60.00 pack-year smoking history. He has never used smokeless tobacco. He reports current alcohol use. He reports that he does not use drugs.  ROS: All other review of systems were reviewed and are negative except what is noted above in HPI  Physical Exam: BP 122/76   Pulse 64   Ht 6' (1.829 m)   Wt 216 lb 8 oz (98.2 kg)   BMI 29.36 kg/m   Constitutional:  Alert and oriented, No acute distress. HEENT: [place] AT, moist mucus membranes.  Trachea midline, no masses. Cardiovascular: No clubbing, cyanosis, or edema. Respiratory: Normal respiratory effort, no increased work of breathing. GI: Abdomen is soft, nontender,  nondistended, no abdominal masses GU: No CVA tenderness.  Lymph: No cervical or inguinal lymphadenopathy. Skin: No rashes, bruises or suspicious lesions. Neurologic: Grossly intact, no focal deficits, moving all 4 extremities. Psychiatric: Normal mood and affect.  Laboratory Data: Lab Results  Component Value Date   WBC 6.9 03/30/2013   HGB 16.4 03/30/2013   HCT 45.8 03/30/2013   MCV 85.3 03/30/2013   PLT 247 03/30/2013    Lab Results  Component Value Date   CREATININE  0.85 12/04/2020    Lab Results  Component Value Date   PSA 3.68 07/07/2013    No results found for: TESTOSTERONE  Lab Results  Component Value Date   HGBA1C 9.9 (H) 12/04/2020    Urinalysis    Component Value Date/Time   APPEARANCEUR Clear 12/21/2020 1347   GLUCOSEU 3+ (A) 12/21/2020 1347   BILIRUBINUR Negative 12/21/2020 1347   PROTEINUR Negative 12/21/2020 1347   NITRITE Negative 12/21/2020 1347   LEUKOCYTESUR Negative 12/21/2020 1347    Lab Results  Component Value Date   LABMICR Comment 12/21/2020    Pertinent Imaging:  No results found for this or any previous visit.  No results found for this or any previous visit.  No results found for this or any previous visit.  No results found for this or any previous visit.  No results found for this or any previous visit.  No results found for this or any previous visit.  No results found for this or any previous visit.  No results found for this or any previous visit.   Assessment & Plan:    1. Benign prostatic hyperplasia with urinary obstruction -patient defers therapy at this therapy.   2. Nocturia -Fluid management prior to going to bed  3. Prostate cancer ([place]) -RTC 6 months with PSA   Return in about 6 months (around 09/28/2021).  Nicolette Bang, MD  [facility] Urology [place]

## 2021-03-29 NOTE — Progress Notes (Signed)
Urological Symptom Review  Patient is experiencing the following symptoms: Get up at night to urinate Stream starts and stops Trouble starting stream Erection problems (male only) Kidney stones  Review of Systems  Gastrointestinal (upper)  : Indigestion/heartburn  Gastrointestinal (lower) : Negative for lower GI symptoms  Constitutional : Fatigue  Skin: Negative for skin symptoms  Eyes: Negative for eye symptoms  Ear/Nose/Throat : Negative for Ear/Nose/Throat symptoms  Hematologic/Lymphatic: Easy bruising  Cardiovascular : Leg swelling  Respiratory : Cough Shortness of breath Endocrine: Excessive thirst  Musculoskeletal: Negative for musculoskeletal symptoms  Neurological: Negative for neurological symptoms  Psychologic: Negative for psychiatric symptoms

## 2021-03-31 ENCOUNTER — Telehealth: Payer: Self-pay | Admitting: Family Medicine

## 2021-03-31 NOTE — Telephone Encounter (Signed)
Nurses  Patient recently had communication sent to [place] by his insurance company stating that he is utilizing albuterol relatively frequently which could indicate he needs to be on an additional inhaler  Please talk with the patient find out how often does he uses albuterol.  He may well benefit from a steroid inhaler to help his lungs as well which would reduce the need for frequent albuterol  (His insurance company will cover Qvar ready inhaler 40 mcg 1 to 2 puffs twice daily, Pulmicort 180 mcg 2 puffs twice daily, Flovent HFA 44 mcg 2 puffs twice daily, Flovent discus 100 mcg 1 inhalation twice daily)

## 2021-04-01 NOTE — Telephone Encounter (Signed)
Left message to return call 

## 2021-04-02 NOTE — Telephone Encounter (Signed)
Pt wife states the he is using Advair also. Using Albuterol 4 times a day every 4 hours. Please advise. Thank you

## 2021-04-02 NOTE — Telephone Encounter (Signed)
Sent patient a mychart message.

## 2021-04-03 NOTE — Telephone Encounter (Signed)
Currently his Advair is not on our med list.  Please confirm with the wife the strength.  Find out if they need this sent in.  Nonetheless please make sure that it is on the epic med list.  Thank you

## 2021-04-04 ENCOUNTER — Ambulatory Visit: Payer: HMO | Admitting: Family Medicine

## 2021-04-04 NOTE — Telephone Encounter (Signed)
Left message to return call 

## 2021-04-05 MED ORDER — FLUTICASONE-SALMETEROL 250-50 MCG/ACT IN AEPB: INHALATION_SPRAY | RESPIRATORY_TRACT | 0 refills | Status: DC

## 2021-04-05 NOTE — Telephone Encounter (Signed)
Pt is currently utilizing Advair 250/50 4 times per day q 4 hours. Pt has been going through the Health Dept. Pt states not needing refills at this time. Epic list updated. Please advise. Thank you

## 2021-04-05 NOTE — Telephone Encounter (Signed)
Pt replied via my chart. They have been going through the health department. My chart message sent back to patient asking if they need refills. Will then send to provider.

## 2021-04-05 NOTE — Telephone Encounter (Signed)
Sent mychart message

## 2021-04-09 ENCOUNTER — Encounter: Payer: Self-pay | Admitting: Family Medicine

## 2021-04-09 ENCOUNTER — Ambulatory Visit (INDEPENDENT_AMBULATORY_CARE_PROVIDER_SITE_OTHER): Payer: HMO | Admitting: Family Medicine

## 2021-04-09 ENCOUNTER — Other Ambulatory Visit: Payer: Self-pay

## 2021-04-09 VITALS — BP 126/78 | HR 63 | Temp 98.2°F | Wt 214.2 lb

## 2021-04-09 DIAGNOSIS — Z79899 Other long term (current) drug therapy: Secondary | ICD-10-CM

## 2021-04-09 DIAGNOSIS — E1169 Type 2 diabetes mellitus with other specified complication: Secondary | ICD-10-CM | POA: Diagnosis not present

## 2021-04-09 DIAGNOSIS — E1165 Type 2 diabetes mellitus with hyperglycemia: Secondary | ICD-10-CM | POA: Diagnosis not present

## 2021-04-09 DIAGNOSIS — K219 Gastro-esophageal reflux disease without esophagitis: Secondary | ICD-10-CM | POA: Diagnosis not present

## 2021-04-09 DIAGNOSIS — E785 Hyperlipidemia, unspecified: Secondary | ICD-10-CM | POA: Diagnosis not present

## 2021-04-09 DIAGNOSIS — I10 Essential (primary) hypertension: Secondary | ICD-10-CM | POA: Diagnosis not present

## 2021-04-09 NOTE — Progress Notes (Signed)
   Subjective:    Patient ID: Roger Phillips, male    DOB: 1955/10/07, 65 y.o.   MRN: [id number]  HPI Pt here for follow up on GERD. Pt states the omeprazole has really helped. Taking BID. Also taking Carafate up to TID.   Primary hypertension - Plan: Hemoglobin A1c, Lipid Profile, Hepatic function panel, Basic Metabolic Panel (BMET)  Uncontrolled type 2 diabetes mellitus with hyperglycemia ([place]) - Plan: Hemoglobin A1c, Lipid Profile, Hepatic function panel, Basic Metabolic Panel (BMET)  Gastroesophageal reflux disease, unspecified whether esophagitis present - Plan: Hemoglobin A1c, Lipid Profile, Hepatic function panel, Basic Metabolic Panel (BMET)  High risk medication use - Plan: Hemoglobin A1c, Lipid Profile, Hepatic function panel, Basic Metabolic Panel (BMET)  Hyperlipidemia associated with type 2 diabetes mellitus ([place]) - Plan: Hemoglobin A1c, Lipid Profile, Hepatic function panel, Basic Metabolic Panel (BMET)  Patient has not done a good job taking his insulin we talked about ways to try to do better with this he pretty much depends on his wife and recently she has had some health issues so hopefully he will be better in the near future Review of Systems     Objective:   Physical Exam  General-in no acute distress Eyes-no discharge Lungs-respiratory rate normal, CTA CV-no murmurs,RRR Extremities skin warm dry no edema Neuro grossly normal Behavior normal, alert       Assessment & Plan:  1. Primary hypertension Blood pressure under good control continue current measures.  Encourage patient to quit smoking.  2. Uncontrolled type 2 diabetes mellitus with hyperglycemia ([place]) Diabetes not under good control he does not do any shots on a regular basis check lab work.  May need endocrinology to help encourage him to do a better job  3. Gastroesophageal reflux disease, unspecified whether esophagitis present Under good control medications continue current  regimen  Patient does have a visit with cardiology in September more than likely have a stress Myoview-patient has significant cardiac risk factors  Patient to follow-up with [place] in 3 months sooner if problems

## 2021-04-10 ENCOUNTER — Other Ambulatory Visit: Payer: Self-pay | Admitting: Family Medicine

## 2021-04-23 ENCOUNTER — Other Ambulatory Visit: Payer: Self-pay | Admitting: Family Medicine

## 2021-04-30 ENCOUNTER — Other Ambulatory Visit: Payer: Self-pay | Admitting: Family Medicine

## 2021-05-13 ENCOUNTER — Other Ambulatory Visit: Payer: Self-pay | Admitting: Family Medicine

## 2021-06-07 ENCOUNTER — Ambulatory Visit: Payer: HMO | Admitting: Cardiology

## 2021-06-07 ENCOUNTER — Encounter: Payer: Self-pay | Admitting: Cardiology

## 2021-06-07 VITALS — BP 118/60 | HR 84 | Ht 72.0 in | Wt 217.0 lb

## 2021-06-07 DIAGNOSIS — E782 Mixed hyperlipidemia: Secondary | ICD-10-CM

## 2021-06-07 DIAGNOSIS — R0789 Other chest pain: Secondary | ICD-10-CM | POA: Diagnosis not present

## 2021-06-07 DIAGNOSIS — I10 Essential (primary) hypertension: Secondary | ICD-10-CM | POA: Diagnosis not present

## 2021-06-07 NOTE — Progress Notes (Signed)
Clinical Summary Roger Phillips is a 65 y.o.male seen today as a new consult, referred by Dr Wolfgang Phoenix for the following medical problems.    1.Chest pain - seen in 2013 for prior chest CT with evidence of coronary atheroscelerosis. Nuclear stress in 2013 showed no ischemia - pressure like pain midchest. 7/10 in severity. Could occur at rest or with activity. Would last 10-15 minutes. No other associated symptoms. Would be better TUMS. Pain often after eating.  - now he is sucralfate, omeprazole. SYmptoms have resolved on these meds - no SOB/DOE with activities.    2. HTN - compliant with meds  3. Hyperlipidemia - 06/2020 TC 162 TG 234 HDL 35 LDL 88 - he is on pravastatin      Past Medical History:  Diagnosis Date   Arthritis    neck and left shoulder   Asthma    Back pain    buldging disc   Complication of anesthesia    difficult to wake up after neck surgery   COPD (chronic obstructive pulmonary disease) (HCC)    Depression    takes Wellbutrin   Diabetes mellitus    takes Metformin bid   Emphysema    GERD (gastroesophageal reflux disease)    takes Omeprazole daily   Hemorrhoids    High cholesterol    takes Pravastatin daily   HTN (hypertension)    takes Lisinopril,Norvasc,and HCTZ daily   IBS (irritable bowel syndrome)    Insomnia    doesn't take any meds    Pneumonia    hx of in 80's and 90's   Prostate cancer (HCC)    Sinus headache    sinus drainage     Allergies  Allergen Reactions   Morphine And Related Other (See Comments)    Insomnia    Sulfa Antibiotics Rash     Current Outpatient Medications  Medication Sig Dispense Refill   diphenoxylate-atropine (LOMOTIL) 2.5-0.025 MG tablet TAKE ONE TABLET BY MOUTH TWICE A DAY AS NEEDED FOR DIARRHEA DUE TO PROSTRATE RADIATION 30 tablet 1   sucralfate (CARAFATE) 1 g tablet TAKE ONE (1) TABLET BY MOUTH 3 TIMES DAILY FOR GERD 90 tablet 0   ACCU-CHEK SOFTCLIX LANCETS lancets      albuterol (VENTOLIN HFA)  108 (90 Base) MCG/ACT inhaler INHALE TWO PUFFS INTO THE LUNGS EVERY FOUR HOURS AS NEEDED. SHAKE WELL BEFORE EACH USE. 18 g 6   Alcohol Swabs (B-D SINGLE USE SWABS REGULAR) PADS      amLODipine (NORVASC) 5 MG tablet TAKE ONE (1) TABLET EACH DAY 90 tablet 1   aspirin 81 MG tablet Take 81 mg by mouth daily.     Blood Glucose Calibration (ACCU-CHEK AVIVA) SOLN      Blood Glucose Monitoring Suppl (ACCU-CHEK AVIVA PLUS) W/DEVICE KIT      cetirizine (ZYRTEC) 10 MG tablet TAKE ONE TABLET (10MG) BY MOUTH AT BEDTIME 90 tablet 0   fluticasone-salmeterol (ADVAIR DISKUS) 250-50 MCG/ACT AEPB Use 4 times daily q 4 hours (per pt) 60 each 0   Fluticasone-Salmeterol (ADVAIR DISKUS) 500-50 MCG/DOSE AEPB USE ONE PUFF TWICE DAILY 60 each 5   FREESTYLE LITE test strip USE TO TEST BLOOD GLUCOSE THREE TIMES DAILY 100 each 5   hydrochlorothiazide (HYDRODIURIL) 25 MG tablet TAKE ONE (1) TABLET BY MOUTH EVERY DAY 90 tablet 1   insulin aspart (NOVOLOG) 100 UNIT/ML FlexPen 8 units with lunch and dinner -may titrate up to 16 units 15 mL 5   insulin glargine (LANTUS SOLOSTAR) 100  UNIT/ML Solostar Pen 40 units qhs. May titrate up to 60 units 3 mL 5   ketoconazole (NIZORAL) 2 % cream Apply 1 application topically 2 (two) times daily. 60 g 4   Lancets (FREESTYLE) lancets Use to check blood sugar TID 300 each 3   losartan (COZAAR) 50 MG tablet TAKE ONE TABLET EACH DAY 90 tablet 1   Meth-Hyo-M Bl-Na Phos-Ph Sal (URIBEL) 118 MG CAPS Take 1 tablet 2 times per day prn. 30 capsule 0   omeprazole (PRILOSEC) 20 MG capsule 1 po bid for gerd 180 capsule 1   phenazopyridine (PYRIDIUM) 100 MG tablet Take 2 to 3 pills daily as needed. 15 tablet 0   pravastatin (PRAVACHOL) 40 MG tablet Take 1 tablet (40 mg total) by mouth daily. 90 tablet 1   sildenafil (REVATIO) 20 MG tablet TAKE UP TO THREE TABLETS BY MOUTH AS NEEDED 50 tablet 5   traMADol (ULTRAM) 50 MG tablet Take 1 tablet (50 mg total) by mouth every 6 (six) hours as needed. 30 tablet 0    No current facility-administered medications for this visit.     Past Surgical History:  Procedure Laterality Date   ANTERIOR CERVICAL DECOMP/DISCECTOMY FUSION  12/04/2011   Procedure: ANTERIOR CERVICAL DECOMPRESSION/DISCECTOMY FUSION 2 LEVELS;  Surgeon: Hosie Spangle, MD;  Location: [place] [facility];  Service: Neurosurgery;  Laterality: N/A;  Cervical Three-Four, Cervical Four-Five anterior cervial decompression with fusion plating and bonegraft   CARPAL TUNNEL RELEASE  10+yrs   right side   COLONOSCOPY N/A 04/19/2014   Procedure: COLONOSCOPY;  Surgeon: Rogene Houston, MD;  [facility];  Service: Endoscopy;  Laterality: N/A;  1030-moved to 1230 Ann to notify pt   CYSTECTOMY  12-8yr ago   from left side of neck   GOLD SEED IMPLANT N/A 12/06/2020   Procedure: [place]  Surgeon: MCleon Gustin MD;  [facility];  Service: Urology;  Laterality: N/A;   SPACE OAR INSTILLATION N/A 12/06/2020   Procedure: SPACE OAR INSTILLATION;  Surgeon: MCleon Gustin MD;  Location[facility];  Service: Urology;  Laterality: N/A;     Allergies  Allergen Reactions   Morphine And Related Other (See Comments)    Insomnia    Sulfa Antibiotics Rash      Family History  Problem Relation Age of Onset   Heart disease Father    Hypertension Father    Heart disease Brother    Anesthesia problems Neg Hx    Hypotension Neg Hx    Malignant hyperthermia Neg Hx    Pseudochol deficiency Neg Hx    Breast cancer Neg Hx    Prostate cancer Neg Hx    Colon cancer Neg Hx    Pancreatic cancer Neg Hx      Social History Mr. SSofrankoreports that he has been smoking cigarettes. He has a 60.00 pack-year smoking history. He has never used smokeless tobacco. Mr. SQuereports current alcohol use.   Review of Systems CONSTITUTIONAL: No weight loss, fever, chills, weakness or fatigue.  HEENT: Eyes: No visual loss, blurred vision, double vision or yellow sclerae.No hearing loss,  sneezing, congestion, runny nose or sore throat.  SKIN: No rash or itching.  CARDIOVASCULAR: per hpi RESPIRATORY: No shortness of breath, cough or sputum.  GASTROINTESTINAL: No anorexia, nausea, vomiting or diarrhea. No abdominal pain or blood.  GENITOURINARY: No burning on urination, no polyuria NEUROLOGICAL: No headache, dizziness, syncope, paralysis, ataxia, numbness or tingling in the extremities. No change in bowel  or bladder control.  MUSCULOSKELETAL: No muscle, back pain, joint pain or stiffness.  LYMPHATICS: No enlarged nodes. No history of splenectomy.  PSYCHIATRIC: No history of depression or anxiety.  ENDOCRINOLOGIC: No reports of sweating, cold or heat intolerance. No polyuria or polydipsia.  Marland Kitchen   Physical Examination Today's Vitals   06/07/21 1458  BP: 118/60  Pulse: 84  SpO2: 91%  Weight: 217 lb (98.4 kg)  Height: 6' (1.829 m)   Body mass index is 29.43 kg/m.  Gen: resting comfortably, no acute distress HEENT: no scleral icterus, pupils equal round and reactive, no palptable cervical adenopathy,  CV: RRR, no m/r/g no jvd Resp: Clear to auscultation bilaterally GI: abdomen is soft, non-tender, non-distended, normal bowel sounds, no hepatosplenomegaly MSK: extremities are warm, no edema.  Skin: warm, no rash Neuro:  no focal deficits Psych: appropriate affect     Assessment and Plan  Chest pains - symptoms have since resolved since starting omeprazole and sucralfate. Symptoms not consitstent with cardiac etiology - no further workup at this time, continue to manage risk factors - pcp EKG reviewed, SR with LAFB. No acute ischemic changes  2. HTN - at goal, continue current meds  3.Hyperlipidemia - overall good control, continue pravastatin   Consider AAA screening if not previously done   F/u with cardiology as needed      Arnoldo Lenis, M.D

## 2021-06-07 NOTE — Patient Instructions (Signed)
Medication Instructions:  Your physician recommends that you continue on your current medications as directed. Please refer to the Current Medication list given to you today.  *If you need a refill on your cardiac medications before your next appointment, please call your pharmacy*   Lab Work: None  If you have labs (blood work) drawn today and your tests are completely normal, you will receive your results only by: [place] (if you have MyChart) OR A paper copy in the mail If you have any lab test that is abnormal or we need to change your treatment, we will call you to review the results.   Testing/Procedures: None    Follow-Up: At [facility], you and your health needs are our priority.  As part of our continuing mission to provide you with exceptional heart care, we have created designated Provider Care Teams.  These Care Teams include your primary Cardiologist (physician) and Advanced Practice Providers (APPs -  Physician Assistants and Nurse Practitioners) who all work together to provide you with the care you need, when you need it.  We recommend signing up for the patient portal called "MyChart".  Sign up information is provided on this After Visit Summary.  MyChart is used to connect with patients for Virtual Visits (Telemedicine).  Patients are able to view lab/test results, encounter notes, upcoming appointments, etc.  Non-urgent messages can be sent to your provider as well.   To learn more about what you can do with MyChart, go to [email or web address].    Your next appointment:  As needed

## 2021-06-12 ENCOUNTER — Other Ambulatory Visit: Payer: Self-pay | Admitting: Family Medicine

## 2021-06-17 ENCOUNTER — Telehealth: Payer: Self-pay | Admitting: Family Medicine

## 2021-06-17 NOTE — Telephone Encounter (Signed)
Nurses Please connect with him His recent prescriptions are showing that he is using his albuterol inhaler very frequently but not using his Advair  Please educate the patient that using the Advair twice daily every day will help his lungs and decrease how often he needs to use the albuterol.  Also in the long run it may help prevent severe COPD flareups.  Please send in 6 refills on his Advair and encourage patient to get get started again on the Advair inhaler if any problems let me know thank you Patient due for follow-up by December

## 2021-06-18 ENCOUNTER — Other Ambulatory Visit: Payer: Self-pay

## 2021-06-18 MED ORDER — FLUTICASONE-SALMETEROL 250-50 MCG/ACT IN AEPB: INHALATION_SPRAY | RESPIRATORY_TRACT | 5 refills | Status: DC

## 2021-06-18 NOTE — Telephone Encounter (Signed)
Refills sent to pharmacy. My chart message sent to patient

## 2021-06-18 NOTE — Telephone Encounter (Signed)
Left message to return call.  Sent in [place] with direction 4 puffs BID; Tammy at [facility] states 2 puffs BID is max.

## 2021-06-18 NOTE — Telephone Encounter (Signed)
Pt wife [name] (DPR) calling back and states pt is taking Advair twice daily- does one puff of Advair. Will send updated script to [facility]. Wife verbalized understanding

## 2021-06-24 ENCOUNTER — Other Ambulatory Visit: Payer: Self-pay | Admitting: Family Medicine

## 2021-06-26 DIAGNOSIS — I10 Essential (primary) hypertension: Secondary | ICD-10-CM | POA: Diagnosis not present

## 2021-06-26 DIAGNOSIS — E119 Type 2 diabetes mellitus without complications: Secondary | ICD-10-CM | POA: Diagnosis not present

## 2021-06-26 DIAGNOSIS — C61 Malignant neoplasm of prostate: Secondary | ICD-10-CM | POA: Diagnosis not present

## 2021-06-26 DIAGNOSIS — Z7982 Long term (current) use of aspirin: Secondary | ICD-10-CM | POA: Diagnosis not present

## 2021-06-26 DIAGNOSIS — Z87891 Personal history of nicotine dependence: Secondary | ICD-10-CM | POA: Diagnosis not present

## 2021-06-26 DIAGNOSIS — Z794 Long term (current) use of insulin: Secondary | ICD-10-CM | POA: Diagnosis not present

## 2021-06-26 DIAGNOSIS — R300 Dysuria: Secondary | ICD-10-CM | POA: Diagnosis not present

## 2021-06-26 DIAGNOSIS — K589 Irritable bowel syndrome without diarrhea: Secondary | ICD-10-CM | POA: Diagnosis not present

## 2021-06-29 NOTE — Telephone Encounter (Signed)
So this medication is now on his current med list Please discussed with patient May discuss with his wife who sometimes knows more about his medicines Is he taking glipizide and we are not aware of it?  Thank you

## 2021-07-01 NOTE — Telephone Encounter (Signed)
Wife(DPR) stated the patient is still taking this medication- he never stopped it

## 2021-07-01 NOTE — Telephone Encounter (Signed)
May have 6 months on medicine 

## 2021-07-04 ENCOUNTER — Other Ambulatory Visit: Payer: Self-pay | Admitting: Family Medicine

## 2021-07-05 ENCOUNTER — Other Ambulatory Visit: Payer: Self-pay | Admitting: Family Medicine

## 2021-07-05 ENCOUNTER — Other Ambulatory Visit: Payer: Self-pay | Admitting: Nurse Practitioner

## 2021-07-05 ENCOUNTER — Telehealth: Payer: Self-pay | Admitting: Family Medicine

## 2021-07-05 ENCOUNTER — Encounter: Payer: Self-pay | Admitting: Family Medicine

## 2021-07-05 MED ORDER — ALBUTEROL SULFATE HFA 108 (90 BASE) MCG/ACT IN AERS: INHALATION_SPRAY | RESPIRATORY_TRACT | 0 refills | Status: DC

## 2021-07-05 NOTE — Telephone Encounter (Signed)
Patient is requesting refill on albuterol 108 called into [facility]

## 2021-07-05 NOTE — Telephone Encounter (Signed)
Carolyn sent in script with no refills. Pharmacy contacted and gave verbal orders to have 2 refills. Pt is aware

## 2021-07-05 NOTE — Telephone Encounter (Signed)
Please advise. Thank you

## 2021-07-05 NOTE — Telephone Encounter (Signed)
2 RF 

## 2021-07-10 ENCOUNTER — Ambulatory Visit: Payer: HMO | Admitting: Family Medicine

## 2021-07-15 ENCOUNTER — Other Ambulatory Visit: Payer: Self-pay | Admitting: Family Medicine

## 2021-07-16 DIAGNOSIS — E1165 Type 2 diabetes mellitus with hyperglycemia: Secondary | ICD-10-CM | POA: Diagnosis not present

## 2021-07-16 DIAGNOSIS — E785 Hyperlipidemia, unspecified: Secondary | ICD-10-CM | POA: Diagnosis not present

## 2021-07-16 DIAGNOSIS — K219 Gastro-esophageal reflux disease without esophagitis: Secondary | ICD-10-CM | POA: Diagnosis not present

## 2021-07-16 DIAGNOSIS — E1169 Type 2 diabetes mellitus with other specified complication: Secondary | ICD-10-CM | POA: Diagnosis not present

## 2021-07-16 DIAGNOSIS — Z79899 Other long term (current) drug therapy: Secondary | ICD-10-CM | POA: Diagnosis not present

## 2021-07-16 DIAGNOSIS — I10 Essential (primary) hypertension: Secondary | ICD-10-CM | POA: Diagnosis not present

## 2021-07-17 LAB — BASIC METABOLIC PANEL
BUN/Creatinine Ratio: 19 (ref 10–24)
BUN: 20 mg/dL (ref 8–27)
CO2: 28 mmol/L (ref 20–29)
Calcium: 9.1 mg/dL (ref 8.6–10.2)
Chloride: 95 mmol/L — ABNORMAL LOW (ref 96–106)
Creatinine, Ser: 1.03 mg/dL (ref 0.76–1.27)
Glucose: 171 mg/dL — ABNORMAL HIGH (ref 70–99)
Potassium: 3.9 mmol/L (ref 3.5–5.2)
Sodium: 137 mmol/L (ref 134–144)
eGFR: 80 mL/min/{1.73_m2} (ref >59)

## 2021-07-17 LAB — HEPATIC FUNCTION PANEL
ALT: 11 IU/L (ref 0–44)
AST: 13 IU/L (ref 0–40)
Albumin: 4.4 g/dL (ref 3.8–4.8)
Alkaline Phosphatase: 72 IU/L (ref 44–121)
Bilirubin Total: 0.6 mg/dL (ref 0.0–1.2)
Bilirubin, Direct: 0.17 mg/dL (ref 0.00–0.40)
Total Protein: 6.7 g/dL (ref 6.0–8.5)

## 2021-07-17 LAB — LIPID PANEL
Chol/HDL Ratio: 4.2 ratio (ref 0.0–5.0)
Cholesterol, Total: 147 mg/dL (ref 100–199)
HDL: 35 mg/dL — ABNORMAL LOW (ref >39)
LDL Chol Calc (NIH): 84 mg/dL (ref 0–99)
Triglycerides: 164 mg/dL — ABNORMAL HIGH (ref 0–149)
VLDL Cholesterol Cal: 28 mg/dL (ref 5–40)

## 2021-07-17 LAB — HEMOGLOBIN A1C
Est. average glucose Bld gHb Est-mCnc: 217 mg/dL
Hgb A1c MFr Bld: 9.2 % — ABNORMAL HIGH (ref 4.8–5.6)

## 2021-07-18 ENCOUNTER — Other Ambulatory Visit: Payer: Self-pay

## 2021-07-18 ENCOUNTER — Ambulatory Visit (INDEPENDENT_AMBULATORY_CARE_PROVIDER_SITE_OTHER): Payer: HMO | Admitting: Family Medicine

## 2021-07-18 VITALS — BP 123/78 | Ht 72.0 in | Wt 216.2 lb

## 2021-07-18 DIAGNOSIS — Z23 Encounter for immunization: Secondary | ICD-10-CM | POA: Diagnosis not present

## 2021-07-18 DIAGNOSIS — I10 Essential (primary) hypertension: Secondary | ICD-10-CM | POA: Diagnosis not present

## 2021-07-18 DIAGNOSIS — Z1211 Encounter for screening for malignant neoplasm of colon: Secondary | ICD-10-CM | POA: Diagnosis not present

## 2021-07-18 DIAGNOSIS — E1165 Type 2 diabetes mellitus with hyperglycemia: Secondary | ICD-10-CM | POA: Diagnosis not present

## 2021-07-18 MED ORDER — GLIPIZIDE 10 MG PO TABS: ORAL_TABLET | ORAL | 1 refills | Status: DC

## 2021-07-18 MED ORDER — LOSARTAN POTASSIUM 50 MG PO TABS: ORAL_TABLET | ORAL | 1 refills | Status: DC

## 2021-07-18 MED ORDER — DAPAGLIFLOZIN PROPANEDIOL 5 MG PO TABS: 5.0000 mg | ORAL_TABLET | Freq: Every day | ORAL | 5 refills | Status: DC

## 2021-07-18 MED ORDER — PRAVASTATIN SODIUM 40 MG PO TABS: 40.0000 mg | ORAL_TABLET | Freq: Every day | ORAL | 1 refills | Status: DC

## 2021-07-18 NOTE — Patient Instructions (Addendum)
Hi Roger Phillips  Below are the results of your blood work.  Your cholesterol profile looks good.  Kidney functions look good.  Liver functions look good. Your A1c is currently at 9.2 which is better than how it has been. The goal is to get your A1c in the 7.0 range  Your blood pressure today is good We will send your referral to [facility] regarding doing colonoscopy if you do not hear from his office over the next 2 weeks-then call his office-please notify us if having any ongoing troubles  You may continue glipizide 10 mg 1 twice daily For now you can hold off on the short acting insulin shots at mealtime Continue your long-acting insulin in the evening  We are adding Wilder Glade it is a medication that works through your kidneys to help lower your sugar.  For some individuals and will increase the amount of urination that occurs.  If you have low sugar spells we will cut back on the glipizide to more likely 1/2 tablet twice a day  Please let us know if you are having any problems The nurse will do your flu shot today  Stop taking HCTZ  I would like for you to do blood work in 3 months with a follow-up office visit at that time Sooner if any problems [facility]-Dr. Nicki Reaper  Results for orders placed or performed in visit on 04/09/21  Hemoglobin A1c  Result Value Ref Range   Hgb A1c MFr Bld 9.2 (H) 4.8 - 5.6 %   Est. average glucose Bld gHb Est-mCnc 217 mg/dL  Lipid Profile  Result Value Ref Range   Cholesterol, Total 147 100 - 199 mg/dL   Triglycerides 164 (H) 0 - 149 mg/dL   HDL 35 (L) >39 mg/dL   VLDL Cholesterol Cal 28 5 - 40 mg/dL   LDL Chol Calc (NIH) 84 0 - 99 mg/dL   Chol/HDL Ratio 4.2 0.0 - 5.0 ratio  Hepatic function panel  Result Value Ref Range   Total Protein 6.7 6.0 - 8.5 g/dL   Albumin 4.4 3.8 - 4.8 g/dL   Bilirubin Total 0.6 0.0 - 1.2 mg/dL   Bilirubin, Direct 0.17 0.00 - 0.40 mg/dL   Alkaline Phosphatase 72 44 - 121 IU/L   AST 13 0 - 40 IU/L   ALT 11 0 - 44 IU/L  Basic  Metabolic Panel (BMET)  Result Value Ref Range   Glucose 171 (H) 70 - 99 mg/dL   BUN 20 8 - 27 mg/dL   Creatinine, Ser 1.03 0.76 - 1.27 mg/dL   eGFR 80 >59 mL/min/1.73   BUN/Creatinine Ratio 19 10 - 24   Sodium 137 134 - 144 mmol/L   Potassium 3.9 3.5 - 5.2 mmol/L   Chloride 95 (L) 96 - 106 mmol/L   CO2 28 20 - 29 mmol/L   Calcium 9.1 8.6 - 10.2 mg/dL

## 2021-07-18 NOTE — Progress Notes (Signed)
   Subjective:    Patient ID: Roger Phillips, male    DOB: 04/09/1956, 65 y.o.   MRN: [id number]  Diabetes He presents for his follow-up diabetic visit. Risk factors for coronary artery disease include diabetes mellitus, dyslipidemia and hypertension. Current diabetic treatment includes insulin injections and oral agent (monotherapy).   Patient states he not taking insulin all the time like he should- misses doses I went over his lab work today A1c not under good control He does not take his short acting insulin on a regular basis but he does do his long-acting insulin He denies any low sugar spells   Review of Systems     Objective:   Physical Exam Lungs are clear heart regular extremities no edema pulse normal BP good       Assessment & Plan:  1. Need for vaccination Flu shot today - Flu Vaccine QUAD High Dose(Fluad)  2. Primary hypertension Blood pressure good control continue current measures - Basic Metabolic Panel (BMET) - Hemoglobin A1c  3. Uncontrolled type 2 diabetes mellitus with hyperglycemia ([place]) Subpar control add [place] patient did not want to consider GLP-1, patient encouraged to quit smoking, continue nightly insulin, stop daytime insulin, continue glipizide, if low sugar spells notify us Repeat lab work in 3 months with follow-up office visit Nutritional consult recommended - Basic Metabolic Panel (BMET) - Hemoglobin A1c  4. Encounter for screening colonoscopy Referral for colonoscopy - Ambulatory referral to Gastroenterology

## 2021-07-22 ENCOUNTER — Encounter (INDEPENDENT_AMBULATORY_CARE_PROVIDER_SITE_OTHER): Payer: Self-pay | Admitting: *Deleted

## 2021-07-24 ENCOUNTER — Telehealth: Payer: Self-pay | Admitting: Family Medicine

## 2021-07-24 NOTE — Telephone Encounter (Signed)
PA approved for Farxiga 5 mg tablet. Approval good through 10/05/22.

## 2021-07-29 ENCOUNTER — Other Ambulatory Visit: Payer: Self-pay | Admitting: Family Medicine

## 2021-08-13 ENCOUNTER — Other Ambulatory Visit: Payer: Self-pay | Admitting: Family Medicine

## 2021-08-16 ENCOUNTER — Telehealth: Payer: Self-pay | Admitting: Family Medicine

## 2021-08-16 NOTE — Telephone Encounter (Signed)
Left message for patient to call back and schedule Medicare Annual Wellness Visit (AWV) in office.   If unable to come into the office for AWV,  please offer to do virtually or by telephone.  Last AWV: 04/26/2019  Please schedule at anytime with [facility].  40 minute appointment  Any questions, please contact me at [phone]

## 2021-09-09 ENCOUNTER — Other Ambulatory Visit: Payer: Self-pay | Admitting: Family Medicine

## 2021-09-10 ENCOUNTER — Other Ambulatory Visit: Payer: Self-pay

## 2021-09-10 ENCOUNTER — Encounter: Payer: Self-pay | Admitting: Family Medicine

## 2021-09-10 MED ORDER — INSULIN ASPART 100 UNIT/ML FLEXPEN: PEN_INJECTOR | SUBCUTANEOUS | 5 refills | Status: DC

## 2021-09-10 MED ORDER — ALBUTEROL SULFATE HFA 108 (90 BASE) MCG/ACT IN AERS: INHALATION_SPRAY | RESPIRATORY_TRACT | 0 refills | Status: DC

## 2021-09-10 MED ORDER — OMEPRAZOLE 20 MG PO CPDR: DELAYED_RELEASE_CAPSULE | ORAL | 1 refills | Status: DC

## 2021-09-10 MED ORDER — LANTUS SOLOSTAR 100 UNIT/ML ~~LOC~~ SOPN: PEN_INJECTOR | SUBCUTANEOUS | 5 refills | Status: DC

## 2021-09-11 ENCOUNTER — Encounter: Payer: Self-pay | Admitting: Family Medicine

## 2021-09-11 ENCOUNTER — Other Ambulatory Visit: Payer: Self-pay | Admitting: Family Medicine

## 2021-09-11 MED ORDER — FLUTICASONE-SALMETEROL 250-50 MCG/ACT IN AEPB: INHALATION_SPRAY | RESPIRATORY_TRACT | 5 refills | Status: DC

## 2021-09-11 MED ORDER — LOSARTAN POTASSIUM 50 MG PO TABS: ORAL_TABLET | ORAL | 1 refills | Status: DC

## 2021-09-11 MED ORDER — OMEPRAZOLE 20 MG PO CPDR: DELAYED_RELEASE_CAPSULE | ORAL | 1 refills | Status: DC

## 2021-09-11 MED ORDER — ALBUTEROL SULFATE HFA 108 (90 BASE) MCG/ACT IN AERS: INHALATION_SPRAY | RESPIRATORY_TRACT | 5 refills | Status: DC

## 2021-09-11 MED ORDER — HYDROCHLOROTHIAZIDE 25 MG PO TABS: ORAL_TABLET | ORAL | 1 refills | Status: DC

## 2021-09-11 MED ORDER — SILDENAFIL CITRATE 20 MG PO TABS: ORAL_TABLET | ORAL | 5 refills | Status: DC

## 2021-09-11 MED ORDER — AMLODIPINE BESYLATE 5 MG PO TABS: ORAL_TABLET | ORAL | 1 refills | Status: DC

## 2021-09-11 MED ORDER — DAPAGLIFLOZIN PROPANEDIOL 5 MG PO TABS: 5.0000 mg | ORAL_TABLET | Freq: Every day | ORAL | 5 refills | Status: DC

## 2021-09-11 MED ORDER — GLIPIZIDE 10 MG PO TABS: ORAL_TABLET | ORAL | 1 refills | Status: DC

## 2021-09-11 MED ORDER — SUCRALFATE 1 G PO TABS: ORAL_TABLET | ORAL | 1 refills | Status: DC

## 2021-09-11 MED ORDER — PRAVASTATIN SODIUM 40 MG PO TABS: 40.0000 mg | ORAL_TABLET | Freq: Every day | ORAL | 1 refills | Status: DC

## 2021-09-11 MED ORDER — CETIRIZINE HCL 10 MG PO TABS: ORAL_TABLET | ORAL | 1 refills | Status: DC

## 2021-09-11 NOTE — Telephone Encounter (Signed)
May send in 6 months worth on standard medicines May have 2 refills on Lomotil You will have to pend the Lomotil Please send that through the prescription refill part of the inbox thank you

## 2021-09-11 NOTE — Telephone Encounter (Signed)
Patient desires to get medicine through [facility] please see my chart message for information regarding refills May refuse the use with [place]

## 2021-09-12 ENCOUNTER — Other Ambulatory Visit: Payer: Self-pay | Admitting: Family Medicine

## 2021-09-12 MED ORDER — DIPHENOXYLATE-ATROPINE 2.5-0.025 MG PO TABS
ORAL_TABLET | ORAL | 2 refills | Status: DC
Start: 2021-09-12 — End: 2021-12-10

## 2021-09-24 ENCOUNTER — Other Ambulatory Visit: Payer: HMO

## 2021-10-02 ENCOUNTER — Other Ambulatory Visit: Payer: Self-pay

## 2021-10-02 ENCOUNTER — Ambulatory Visit (INDEPENDENT_AMBULATORY_CARE_PROVIDER_SITE_OTHER): Payer: HMO | Admitting: Urology

## 2021-10-02 ENCOUNTER — Encounter: Payer: Self-pay | Admitting: Urology

## 2021-10-02 VITALS — BP 125/74 | HR 70

## 2021-10-02 DIAGNOSIS — C61 Malignant neoplasm of prostate: Secondary | ICD-10-CM

## 2021-10-02 DIAGNOSIS — R351 Nocturia: Secondary | ICD-10-CM | POA: Diagnosis not present

## 2021-10-02 LAB — URINALYSIS, ROUTINE W REFLEX MICROSCOPIC
Bilirubin, UA: NEGATIVE
Glucose, UA: NEGATIVE
Ketones, UA: NEGATIVE
Leukocytes,UA: NEGATIVE
Nitrite, UA: NEGATIVE
Protein,UA: NEGATIVE
Specific Gravity, UA: 1.025 (ref 1.005–1.030)
Urobilinogen, Ur: 0.2 mg/dL (ref 0.2–1.0)
pH, UA: 6 (ref 5.0–7.5)

## 2021-10-02 LAB — MICROSCOPIC EXAMINATION
Bacteria, UA: NONE SEEN
Epithelial Cells (non renal): NONE SEEN /hpf (ref 0–10)
RBC, Urine: NONE SEEN /hpf (ref 0–2)
Renal Epithel, UA: NONE SEEN /hpf
WBC, UA: NONE SEEN /hpf (ref 0–5)

## 2021-10-02 NOTE — Patient Instructions (Signed)
Prostate Cancer °The prostate is a small gland that helps make semen. It is located below a man's bladder, in front of the rectum. Prostate cancer is when abnormal cells grow in this gland. °What are the causes? °The cause of this condition is not known. °What increases the risk? °Being age 67 or older. °Having a family history of prostate cancer. °Having a family history of cancer of the breasts or ovaries. °Having genes that are passed from parent to child (inherited). °Having Lynch syndrome. °African American men and men of African descent are diagnosed with prostate cancer at higher rates than other men. °What are the signs or symptoms? °Problems peeing (urinating). This may include: °A stream that is weak, or pee that stops and starts. °Trouble starting or stopping your pee. °Trouble emptying all of your pee. °Needing to pee more often, especially at night. °Blood in your pee or semen. °Pain in the: °Lower back. °Lower belly (abdomen). °Hips. °Trouble getting an erection. °Weakness or numbness in the legs or feet. °How is this treated? °Treatment for this condition depends on: °How much the cancer has spread. °Your age. °The kind of treatment you want. °Your health. °Treatments include: °Being watched. This is called observation. You will be tested from time to time, but you will not get treated. Tests are to make sure that the cancer is not growing. °Surgery. This may be done to: °Take out (remove) the prostate. °Freeze and kill cancer cells. °Radiation. This uses a strong beam of energy to kill cancer cells. °Chemotherapy. This uses medicines that stop cancer cells from increasing. This kills cancer cells and healthy cells. °Targeted therapy. This kills cancer cells only. Healthy cells are not affected. °Hormone treatment. This stops the body from making hormones that help the cancer cells grow. °Follow these instructions at home: °Lifestyle °Do not smoke or use any products that contain nicotine or tobacco.  If you need help quitting, ask your doctor. °Eat a healthy diet. °Treatment may affect your ability to have sex. If you have a partner, touch, hold, hug, and caress your partner to have intimate moments. °Get plenty of sleep. °Ask your doctor for help to find a support group for men with prostate cancer. °General instructions °Take over-the-counter and prescription medicines only as told by your doctor. °If you have to go to the hospital, let your cancer doctor (oncologist) know. °Keep all follow-up visits. °Where to find more information °American Cancer Society: www.cancer.org °American Society of Clinical Oncology: www.cancer.net °National Cancer Institute: www.cancer.gov °Contact a doctor if: °You have new or more trouble peeing. °You have new or more blood in your pee. °You have new or more pain in your hips, back, or chest. °Get help right away if: °You have weakness in your legs. °You lose feeling in your legs. °You cannot control your pee or your poop (stool). °You have chills or a fever. °Summary °The prostate is a male gland that helps make semen. °Prostate cancer is when abnormal cells grow in this gland. °Treatment includes doing surgery, using medicines, using strong beams of energy, or watching without treatment. °Ask your doctor for help to find a support group for men with prostate cancer. °Contact a doctor if you have problems peeing or have any new pain that you did not have before. °This information is not intended to replace advice given to you by your health care provider. Make sure you discuss any questions you have with your health care provider. °Document Revised: 12/19/2020 Document Reviewed: 12/19/2020 °Elsevier   Patient Education © 2022 Elsevier Inc. ° °

## 2021-10-02 NOTE — Progress Notes (Signed)
Urological Symptom Review  Patient is experiencing the following symptoms: Burning/pain with urination Get up at night to urinate Stream starts and stops   Review of Systems  Gastrointestinal (upper)  : Negative for upper GI symptoms  Gastrointestinal (lower) : Diarrhea  Constitutional : Negative for symptoms  Skin: Negative for skin symptoms  Eyes: Negative for eye symptoms  Ear/Nose/Throat : Negative for Ear/Nose/Throat symptoms  Hematologic/Lymphatic: Negative for Hematologic/Lymphatic symptoms  Cardiovascular : Leg swelling  Respiratory : Shortness of breath  Endocrine: Negative for endocrine symptoms  Musculoskeletal: Back pain Joint pain  Neurological: Negative for neurological symptoms  Psychologic: Negative for psychiatric symptoms

## 2021-10-02 NOTE — Progress Notes (Signed)
10/02/2021 2:06 PM   Roger Phillips Dec 10, 1955 [id number]  Referring provider: Kathyrn Drown, MD [address] [place],  [place] [address]  Followup prostate cancer and BPH   HPI: Mr Wyndham is a 65yo here for followup for nocturia and prostate cancer. IPSS 11, QOL 2 on no BPH therapy. Intermittent dysuria after IMRT. Urine stream strong. Nocturia 1-3x depending on fluid consumption.  He finished IMRT 02/2021. PSA 0.88 in 06/2021   PMH: Past Medical History:  Diagnosis Date   Arthritis    neck and left shoulder   Asthma    Back pain    buldging disc   Complication of anesthesia    difficult to wake up after neck surgery   COPD (chronic obstructive pulmonary disease) (HCC)    Depression    takes Wellbutrin   Diabetes mellitus    takes Metformin bid   Emphysema    GERD (gastroesophageal reflux disease)    takes Omeprazole daily   Hemorrhoids    High cholesterol    takes Pravastatin daily   HTN (hypertension)    takes Lisinopril,Norvasc,and HCTZ daily   IBS (irritable bowel syndrome)    Insomnia    doesn't take any meds    Pneumonia    hx of in 80's and 90's   Prostate cancer ([place])    Sinus headache    sinus drainage    Surgical History: Past Surgical History:  Procedure Laterality Date   ANTERIOR CERVICAL DECOMP/DISCECTOMY FUSION  12/04/2011   Procedure: ANTERIOR CERVICAL DECOMPRESSION/DISCECTOMY FUSION 2 LEVELS;  Surgeon: Hosie Spangle, MD;  Location: [facility];  Service: Neurosurgery;  Laterality: N/A;  Cervical Three-Four, Cervical Four-Five anterior cervial decompression with fusion plating and bonegraft   CARPAL TUNNEL RELEASE  10+yrs   right side   COLONOSCOPY N/A 04/19/2014   Procedure: COLONOSCOPY;  Surgeon: Rogene Houston, MD;  Location: [facility];  Service: Endoscopy;  Laterality: N/A;  1030-moved to 1230 Ann to notify pt   CYSTECTOMY  12-62yr ago   from left side of neck   GOLD SEED IMPLANT N/A 12/06/2020   Procedure: GWright  Surgeon: MCleon Gustin MD;  Location: [facility];  Service: Urology;  Laterality: N/A;   SPACE OAR INSTILLATION N/A 12/06/2020   Procedure: SPACE OAR INSTILLATION;  Surgeon: MCleon Gustin MD;  Location: [facility];  Service: Urology;  Laterality: N/A;    Home Medications:  Allergies as of 10/02/2021       Reactions   Morphine And Related Other (See Comments)   Insomnia   Sulfa Antibiotics Rash        Medication List        Accurate as of October 02, 2021  2:06 PM. If you have any questions, ask your nurse or doctor.          Accu-Chek Aviva Plus w/Device Kit   Accu-Chek Aviva Soln   Accu-Chek Softclix Lancets lancets   freestyle lancets Use to check blood sugar TID   albuterol 108 (90 Base) MCG/ACT inhaler Commonly known as: VENTOLIN HFA INHALE TWO PUFFS INTO THE LUNGS EVERY FOUR HOURS AS NEEDED. SHAKE WELL BEFORE EACH USE.   amLODipine 5 MG tablet Commonly known as: NORVASC TAKE ONE (1) TABLET BY MOUTH EVERY DAY   aspirin 81 MG tablet Take 81 mg by mouth daily.   B-D SINGLE USE SWABS REGULAR Pads   cetirizine 10 MG tablet Commonly known as: ZYRTEC TAKE ONE TABLET (10MG)  BY MOUTH AT BEDTIME   dapagliflozin propanediol 5 MG Tabs tablet Commonly known as: Farxiga Take 1 tablet (5 mg total) by mouth daily before breakfast.   diphenoxylate-atropine 2.5-0.025 MG tablet Commonly known as: LOMOTIL TAKE ONE TABLET BY MOUTH TWICE A DAY AS NEEDED FOR DIARRHEA DUE TO PROSTRATE RADIATION   fluticasone-salmeterol 250-50 MCG/ACT Aepb Commonly known as: Advair Diskus Inhale one puff po BID   FREESTYLE LITE test strip Generic drug: glucose blood USE TO TEST BLOOD GLUCOSE THREE TIMES DAILY   glipiZIDE 10 MG tablet Commonly known as: GLUCOTROL TAKE ONE  TABLET BY MOUTH TWICE A DAY   hydrochlorothiazide 25 MG tablet Commonly known as: HYDRODIURIL TAKE ONE (1) TABLET BY MOUTH EVERY DAY   insulin aspart 100 UNIT/ML FlexPen Commonly known as:  NOVOLOG 8 units with lunch and dinner -may titrate up to 16 units   ketoconazole 2 % cream Commonly known as: NIZORAL Apply 1 application topically 2 (two) times daily.   Lantus SoloStar 100 UNIT/ML Solostar Pen Generic drug: insulin glargine 40 units qhs. May titrate up to 60 units   losartan 50 MG tablet Commonly known as: COZAAR TAKE ONE TABLET EACH DAY   omeprazole 20 MG capsule Commonly known as: PRILOSEC 1 po bid for gerd   phenazopyridine 100 MG tablet Commonly known as: Pyridium Take 2 to 3 pills daily as needed.   pravastatin 40 MG tablet Commonly known as: PRAVACHOL Take 1 tablet (40 mg total) by mouth daily.   sildenafil 20 MG tablet Commonly known as: REVATIO TAKE UP TO THREE TABLETS BY MOUTH AS NEEDED   sucralfate 1 g tablet Commonly known as: CARAFATE TAKE ONE (1) TABLET BY MOUTH 3 TIMES DAILY FOR GERD   traMADol 50 MG tablet Commonly known as: Ultram Take 1 tablet (50 mg total) by mouth every 6 (six) hours as needed.   Uribel 118 MG Caps Take 1 tablet 2 times per day prn.        Allergies:  Allergies  Allergen Reactions   Morphine And Related Other (See Comments)    Insomnia    Sulfa Antibiotics Rash    Family History: Family History  Problem Relation Age of Onset   Heart disease Father    Hypertension Father    Heart disease Brother    Anesthesia problems Neg Hx    Hypotension Neg Hx    Malignant hyperthermia Neg Hx    Pseudochol deficiency Neg Hx    Breast cancer Neg Hx    Prostate cancer Neg Hx    Colon cancer Neg Hx    Pancreatic cancer Neg Hx     Social History:  reports that he has been smoking cigarettes. He has a 60.00 pack-year smoking history. He has never used smokeless tobacco. He reports current alcohol use. He reports that he does not use drugs.  ROS: All other review of systems were reviewed and are negative except what is noted above in HPI  Physical Exam: BP 125/74    Pulse 70   Constitutional:  Alert and  oriented, No acute distress. HEENT: [place] AT, moist mucus membranes.  Trachea midline, no masses. Cardiovascular: No clubbing, cyanosis, or edema. Respiratory: Normal respiratory effort, no increased work of breathing. GI: Abdomen is soft, nontender, nondistended, no abdominal masses GU: No CVA tenderness.  Lymph: No cervical or inguinal lymphadenopathy. Skin: No rashes, bruises or suspicious lesions. Neurologic: Grossly intact, no focal deficits, moving all 4 extremities. Psychiatric: Normal mood and affect.  Laboratory Data: Lab Results  Component Value Date   WBC 6.9 03/30/2013   HGB 16.4 03/30/2013   HCT 45.8 03/30/2013   MCV 85.3 03/30/2013   PLT 247 03/30/2013    Lab Results  Component Value Date   CREATININE 1.03 07/16/2021    Lab Results  Component Value Date   PSA 3.68 07/07/2013    No results found for: TESTOSTERONE  Lab Results  Component Value Date   HGBA1C 9.2 (H) 07/16/2021    Urinalysis    Component Value Date/Time   APPEARANCEUR Clear 03/29/2021 1459   GLUCOSEU 1+ (A) 03/29/2021 1459   BILIRUBINUR Negative 03/29/2021 1459   PROTEINUR Negative 03/29/2021 1459   NITRITE Negative 03/29/2021 1459   LEUKOCYTESUR Negative 03/29/2021 1459    Lab Results  Component Value Date   LABMICR See below: 03/29/2021   WBCUA None seen 03/29/2021   LABEPIT None seen 03/29/2021   MUCUS Present 03/29/2021   BACTERIA None seen 03/29/2021    Pertinent Imaging:  No results found for this or any previous visit.  No results found for this or any previous visit.  No results found for this or any previous visit.  No results found for this or any previous visit.  No results found for this or any previous visit.  No results found for this or any previous visit.  No results found for this or any previous visit.  No results found for this or any previous visit.   Assessment & Plan:    1. Nocturia -patient defers therapy at this time - Urinalysis, Routine  w reflex microscopic  2. Prostate cancer ([place]) -RTC 6 months with PSA - Urinalysis, Routine w reflex microscopic   No follow-ups on file.  Nicolette Bang, MD  [facility] [place]

## 2021-10-08 ENCOUNTER — Encounter: Payer: Self-pay | Admitting: Urology

## 2021-10-18 ENCOUNTER — Ambulatory Visit: Payer: HMO | Admitting: Family Medicine

## 2021-10-28 ENCOUNTER — Other Ambulatory Visit: Payer: Self-pay | Admitting: Family Medicine

## 2021-12-09 ENCOUNTER — Other Ambulatory Visit: Payer: Self-pay | Admitting: Family Medicine

## 2021-12-10 ENCOUNTER — Other Ambulatory Visit: Payer: Self-pay | Admitting: Family Medicine

## 2021-12-10 NOTE — Telephone Encounter (Signed)
May have 30 days on each needs office visit this spring ?

## 2021-12-13 ENCOUNTER — Other Ambulatory Visit: Payer: Self-pay | Admitting: Family Medicine

## 2022-01-08 ENCOUNTER — Telehealth: Payer: Self-pay | Admitting: Family Medicine

## 2022-01-08 NOTE — Telephone Encounter (Signed)
No answer unable to leave a message for patient to call back and schedule Medicare Annual Wellness Visit (AWV) in office.  ? ?If unable to come into the office for AWV,  please offer to do virtually or by telephone. ? ?Last AWV: 04/26/2019 ? ?Please schedule at anytime with [facility]. ? ?30 minute appointment for Virtual or phone ? ?45 minute appointment for in office or Initial virtual/phone ? ?Any questions, please contact me at [phone]  ?  ?  ?

## 2022-01-09 ENCOUNTER — Other Ambulatory Visit: Payer: Self-pay | Admitting: *Deleted

## 2022-01-09 ENCOUNTER — Encounter: Payer: Self-pay | Admitting: Family Medicine

## 2022-01-09 MED ORDER — FLUCONAZOLE 150 MG PO TABS: ORAL_TABLET | ORAL | 1 refills | Status: DC

## 2022-01-09 NOTE — Telephone Encounter (Signed)
Diflucan 150 mg 1 p.o. x1, may repeat in 1 week, #2 with 1 refill ? ?As for the hands and feet swelling I recommend office visit with myself or Docia Barrier thank you ?

## 2022-01-10 ENCOUNTER — Other Ambulatory Visit: Payer: Self-pay | Admitting: Family Medicine

## 2022-01-13 ENCOUNTER — Other Ambulatory Visit: Payer: Self-pay | Admitting: *Deleted

## 2022-01-13 ENCOUNTER — Other Ambulatory Visit: Payer: Self-pay | Admitting: Family Medicine

## 2022-01-13 MED ORDER — DIPHENOXYLATE-ATROPINE 2.5-0.025 MG PO TABS: ORAL_TABLET | ORAL | 0 refills | Status: DC

## 2022-01-16 ENCOUNTER — Ambulatory Visit (INDEPENDENT_AMBULATORY_CARE_PROVIDER_SITE_OTHER): Payer: HMO | Admitting: Family Medicine

## 2022-01-16 VITALS — BP 114/70 | HR 69 | Temp 98.4°F | Ht 72.0 in | Wt 219.0 lb

## 2022-01-16 DIAGNOSIS — Z0100 Encounter for examination of eyes and vision without abnormal findings: Secondary | ICD-10-CM

## 2022-01-16 DIAGNOSIS — R972 Elevated prostate specific antigen [PSA]: Secondary | ICD-10-CM | POA: Diagnosis not present

## 2022-01-16 DIAGNOSIS — Z1211 Encounter for screening for malignant neoplasm of colon: Secondary | ICD-10-CM

## 2022-01-16 DIAGNOSIS — E1165 Type 2 diabetes mellitus with hyperglycemia: Secondary | ICD-10-CM

## 2022-01-16 DIAGNOSIS — E785 Hyperlipidemia, unspecified: Secondary | ICD-10-CM

## 2022-01-16 DIAGNOSIS — J438 Other emphysema: Secondary | ICD-10-CM | POA: Diagnosis not present

## 2022-01-16 DIAGNOSIS — E119 Type 2 diabetes mellitus without complications: Secondary | ICD-10-CM | POA: Diagnosis not present

## 2022-01-16 DIAGNOSIS — Z79899 Other long term (current) drug therapy: Secondary | ICD-10-CM | POA: Diagnosis not present

## 2022-01-16 DIAGNOSIS — Z23 Encounter for immunization: Secondary | ICD-10-CM | POA: Diagnosis not present

## 2022-01-16 DIAGNOSIS — E1169 Type 2 diabetes mellitus with other specified complication: Secondary | ICD-10-CM

## 2022-01-16 DIAGNOSIS — I10 Essential (primary) hypertension: Secondary | ICD-10-CM | POA: Diagnosis not present

## 2022-01-16 DIAGNOSIS — C61 Malignant neoplasm of prostate: Secondary | ICD-10-CM | POA: Diagnosis not present

## 2022-01-16 MED ORDER — DIPHENOXYLATE-ATROPINE 2.5-0.025 MG PO TABS: ORAL_TABLET | ORAL | 5 refills | Status: DC

## 2022-01-16 MED ORDER — PRAVASTATIN SODIUM 40 MG PO TABS: 40.0000 mg | ORAL_TABLET | Freq: Every day | ORAL | 3 refills | Status: DC

## 2022-01-16 MED ORDER — OLOPATADINE HCL 0.2 % OP SOLN: 1.0000 [drp] | Freq: Every evening | OPHTHALMIC | 5 refills | Status: DC | PRN

## 2022-01-16 MED ORDER — FLUCONAZOLE 150 MG PO TABS: ORAL_TABLET | ORAL | 4 refills | Status: DC

## 2022-01-16 MED ORDER — AMLODIPINE BESYLATE 5 MG PO TABS: ORAL_TABLET | ORAL | 1 refills | Status: DC

## 2022-01-16 MED ORDER — LOSARTAN POTASSIUM 50 MG PO TABS: ORAL_TABLET | ORAL | 1 refills | Status: DC

## 2022-01-16 MED ORDER — HYDROCHLOROTHIAZIDE 25 MG PO TABS: ORAL_TABLET | ORAL | 1 refills | Status: DC

## 2022-01-16 MED ORDER — CETIRIZINE HCL 10 MG PO TABS: ORAL_TABLET | ORAL | 12 refills | Status: DC

## 2022-01-16 MED ORDER — GLIPIZIDE 10 MG PO TABS: ORAL_TABLET | ORAL | 1 refills | Status: DC

## 2022-01-16 MED ORDER — FLUTICASONE-SALMETEROL 250-50 MCG/ACT IN AEPB: INHALATION_SPRAY | RESPIRATORY_TRACT | 5 refills | Status: DC

## 2022-01-16 NOTE — Progress Notes (Signed)
? ?Subjective:  ? ? Patient ID: Roger Phillips, male    DOB: 1955/10/31, 66 y.o.   MRN: [id number] ? ?Diabetes ?He presents for his follow-up diabetic visit. He has type 2 diabetes mellitus. Current diabetic treatments: lantus , novolog, gliipizide.  ?Hypertension ?This is a chronic problem. Risk factors for coronary artery disease include diabetes mellitus. Treatments tried: amlodipine, hctz.  ? ?Needing prescriptions for fluconazole and lomotil refills along with all other medications ?Primary hypertension - Plan: Hemoglobin A1c, PSA, Urine Microalbumin w/creat. ratio, Basic metabolic panel, Lipid panel, Hepatic function panel ? ?Hyperlipidemia associated with type 2 diabetes mellitus ([place]) - Plan: Hemoglobin A1c, PSA, Urine Microalbumin w/creat. ratio, Basic metabolic panel, Lipid panel, Hepatic function panel ? ?Uncontrolled type 2 diabetes mellitus with hyperglycemia ([place]) - Plan: Hemoglobin A1c, PSA, Urine Microalbumin w/creat. ratio, Basic metabolic panel, Lipid panel, Hepatic function panel, Ambulatory referral to Ophthalmology ? ?Prostate cancer ([place]) - Plan: Hemoglobin A1c, PSA, Urine Microalbumin w/creat. ratio, Basic metabolic panel, Lipid panel, Hepatic function panel ? ?High risk medication use - Plan: Hemoglobin A1c, PSA, Urine Microalbumin w/creat. ratio, Basic metabolic panel, Lipid panel, Hepatic function panel ? ?Elevated PSA - Plan: Hemoglobin A1c, PSA, Urine Microalbumin w/creat. ratio, Basic metabolic panel, Lipid panel, Hepatic function panel ? ?Immunization due - Plan: Pneumococcal conjugate vaccine 20-valent (Prevnar 20) ? ?Screening for colon cancer - Plan: Ambulatory referral to Gastroenterology ? ?Diabetic eye exam [facility]) - Plan: Ambulatory referral to Ophthalmology ? ?Other emphysema ([place]), Chronic ? ?Review of Systems ? ?   ?Objective:  ? Physical Exam ? ?General-in no acute distress ?Eyes-no discharge ?Lungs-respiratory rate normal, CTA ?CV-no murmurs,RRR ?Extremities skin warm dry no  edema ?Neuro grossly normal ?Behavior normal, alert ? ? ? ?   ?Assessment & Plan:  ?1. Primary hypertension ?Blood pressure decent response continue current medications watch diet increase vegetables fruits minimize fried foods fit in more walking ?- Hemoglobin A1c ?- PSA ?- Urine Microalbumin w/creat. ratio ?- Basic metabolic panel ?- Lipid panel ?- Hepatic function panel ? ?2. Hyperlipidemia associated with type 2 diabetes mellitus ([place]) ?Continue medication watch diet check lab work await results get LDL below 70 ?- Hemoglobin A1c ?- PSA ?- Urine Microalbumin w/creat. ratio ?- Basic metabolic panel ?- Lipid panel ?- Hepatic function panel ? ?3. Uncontrolled type 2 diabetes mellitus with hyperglycemia ([place]) ?Needs to cut back on starches needs to fit in more walking and needs to take his medicine on a regular basis check lab work to make additional medicines. ?- Hemoglobin A1c ?- PSA ?- Urine Microalbumin w/creat. ratio ?- Basic metabolic panel ?- Lipid panel ?- Hepatic function panel ?- Ambulatory referral to Ophthalmology ? ?4. Prostate cancer ([place]) ?History of prostate cancer check PSA ?- Hemoglobin A1c ?- PSA ?- Urine Microalbumin w/creat. ratio ?- Basic metabolic panel ?- Lipid panel ?- Hepatic function panel ? ?5. High risk medication use ?Continue medication check liver function ?- Hemoglobin A1c ?- PSA ?- Urine Microalbumin w/creat. ratio ?- Basic metabolic panel ?- Lipid panel ?- Hepatic function panel ? ?6. Elevated PSA ?Check PSA as planned ?- Hemoglobin A1c ?- PSA ?- Urine Microalbumin w/creat. ratio ?- Basic metabolic panel ?- Lipid panel ?- Hepatic function panel ? ?7. Immunization due ?Vaccine today ?- Pneumococcal conjugate vaccine 20-valent (Prevnar 20) ? ?8. Screening for colon cancer ?Screening colonoscopy recommended ?- Ambulatory referral to Gastroenterology ? ?9. Diabetic eye exam ([place]) ?Diabetic eye exam recommended referral to ophthalmology ?- Ambulatory referral to Ophthalmology ? ?10.  Other emphysema ([place]) ?Continue  inhalers patient encouraged to quit smoking ? ? ?

## 2022-01-20 ENCOUNTER — Encounter (INDEPENDENT_AMBULATORY_CARE_PROVIDER_SITE_OTHER): Payer: Self-pay | Admitting: *Deleted

## 2022-01-25 ENCOUNTER — Other Ambulatory Visit: Payer: Self-pay | Admitting: Family Medicine

## 2022-02-24 DIAGNOSIS — C61 Malignant neoplasm of prostate: Secondary | ICD-10-CM | POA: Diagnosis not present

## 2022-02-25 DIAGNOSIS — Z87891 Personal history of nicotine dependence: Secondary | ICD-10-CM | POA: Diagnosis not present

## 2022-02-25 DIAGNOSIS — C61 Malignant neoplasm of prostate: Secondary | ICD-10-CM | POA: Diagnosis not present

## 2022-02-25 DIAGNOSIS — I10 Essential (primary) hypertension: Secondary | ICD-10-CM | POA: Diagnosis not present

## 2022-02-25 DIAGNOSIS — K589 Irritable bowel syndrome without diarrhea: Secondary | ICD-10-CM | POA: Diagnosis not present

## 2022-02-25 DIAGNOSIS — R300 Dysuria: Secondary | ICD-10-CM | POA: Diagnosis not present

## 2022-02-25 DIAGNOSIS — E119 Type 2 diabetes mellitus without complications: Secondary | ICD-10-CM | POA: Diagnosis not present

## 2022-02-25 DIAGNOSIS — Z7982 Long term (current) use of aspirin: Secondary | ICD-10-CM | POA: Diagnosis not present

## 2022-02-25 DIAGNOSIS — Z794 Long term (current) use of insulin: Secondary | ICD-10-CM | POA: Diagnosis not present

## 2022-03-10 ENCOUNTER — Telehealth: Payer: Self-pay | Admitting: Family Medicine

## 2022-03-10 NOTE — Telephone Encounter (Signed)
Left message for patient to call back and schedule Medicare Annual Wellness Visit (AWV) in office.   If unable to come into the office for AWV,  please offer to do virtually or by telephone.  Last AWV: 04/26/2019  Please schedule at anytime with [facility].  30 minute appointment for Virtual or phone  45 minute appointment for in office or Initial virtual/phone  Any questions, please contact me at [phone]

## 2022-03-26 ENCOUNTER — Other Ambulatory Visit: Payer: HMO

## 2022-04-02 ENCOUNTER — Ambulatory Visit: Payer: HMO | Admitting: Urology

## 2022-04-09 ENCOUNTER — Ambulatory Visit (INDEPENDENT_AMBULATORY_CARE_PROVIDER_SITE_OTHER): Payer: HMO | Admitting: Urology

## 2022-04-09 ENCOUNTER — Encounter: Payer: Self-pay | Admitting: Urology

## 2022-04-09 VITALS — BP 119/66 | HR 68

## 2022-04-09 DIAGNOSIS — N138 Other obstructive and reflux uropathy: Secondary | ICD-10-CM

## 2022-04-09 DIAGNOSIS — R300 Dysuria: Secondary | ICD-10-CM

## 2022-04-09 DIAGNOSIS — N401 Enlarged prostate with lower urinary tract symptoms: Secondary | ICD-10-CM

## 2022-04-09 DIAGNOSIS — C61 Malignant neoplasm of prostate: Secondary | ICD-10-CM | POA: Diagnosis not present

## 2022-04-09 DIAGNOSIS — R351 Nocturia: Secondary | ICD-10-CM | POA: Diagnosis not present

## 2022-04-09 DIAGNOSIS — N200 Calculus of kidney: Secondary | ICD-10-CM | POA: Diagnosis not present

## 2022-04-09 LAB — MICROSCOPIC EXAMINATION
Renal Epithel, UA: NONE SEEN /hpf
WBC, UA: NONE SEEN /hpf (ref 0–5)

## 2022-04-09 LAB — URINALYSIS, ROUTINE W REFLEX MICROSCOPIC
Bilirubin, UA: NEGATIVE
Leukocytes,UA: NEGATIVE
Nitrite, UA: NEGATIVE
Specific Gravity, UA: 1.025 (ref 1.005–1.030)
Urobilinogen, Ur: 0.2 mg/dL (ref 0.2–1.0)
pH, UA: 5 (ref 5.0–7.5)

## 2022-04-09 MED ORDER — ALFUZOSIN HCL ER 10 MG PO TB24: 10.0000 mg | ORAL_TABLET | Freq: Every day | ORAL | 11 refills | Status: DC

## 2022-04-09 NOTE — Progress Notes (Signed)
04/09/2022 4:42 PM   Roger Phillips [birth year] [id number]  Referring provider: Kathyrn Drown, MD [address] [place],  [place] [id number]  Followup prostate cancer   HPI: Roger Phillips is a 66yo here for followup for prostate cancer. PSA 0.75. IPSS 10 QOL 1. His new complaint today is kidney stones. He passed 4 kidney stones in the past 2 weeks. He has intermittent dysuria which improves after he passes a calculus.    PMH: Past Medical History:  Diagnosis Date   Arthritis    neck and left shoulder   Asthma    Back pain    buldging disc   Complication of anesthesia    difficult to wake up after neck surgery   COPD (chronic obstructive pulmonary disease) (HCC)    Depression    takes Wellbutrin   Diabetes mellitus    takes Metformin bid   Emphysema    GERD (gastroesophageal reflux disease)    takes Omeprazole daily   Hemorrhoids    High cholesterol    takes Pravastatin daily   HTN (hypertension)    takes Lisinopril,Norvasc,and HCTZ daily   IBS (irritable bowel syndrome)    Insomnia    doesn't take any meds    Pneumonia    hx of in 80's and 90's   Prostate cancer (HCC)    Sinus headache    sinus drainage    Surgical History: Past Surgical History:  Procedure Laterality Date   ANTERIOR CERVICAL DECOMP/DISCECTOMY FUSION  12/04/2011   Procedure: ANTERIOR CERVICAL DECOMPRESSION/DISCECTOMY FUSION 2 LEVELS;  Surgeon: Hosie Spangle, MD;  Location: [facility];  Service: Neurosurgery;  Laterality: N/A;  Cervical Three-Four, Cervical Four-Five anterior cervial decompression with fusion plating and bonegraft   CARPAL TUNNEL RELEASE  10+yrs   right side   COLONOSCOPY N/A 04/19/2014   Procedure: COLONOSCOPY;  Surgeon: Rogene Houston, MD;  Location: [facility];  Service: Endoscopy;  Laterality: N/A;  1030-moved to 1230 Ann to notify pt   CYSTECTOMY  12-2yr ago   from left side of neck   GOLD SEED IMPLANT N/A 12/06/2020   Procedure: GMontrose   Surgeon: MCleon Gustin MD;  Location: [facility];  Service: Urology;  Laterality: N/A;   SPACE OAR INSTILLATION N/A 12/06/2020   Procedure: SPACE OAR INSTILLATION;  Surgeon: MCleon Gustin MD;  Location: [facility];  Service: Urology;  Laterality: N/A;    Home Medications:  Allergies as of 04/09/2022       Reactions   Morphine And Related Other (See Comments)   Insomnia   Sulfa Antibiotics Rash        Medication List        Accurate as of April 09, 2022  4:42 PM. If you have any questions, ask your nurse or doctor.          Accu-Chek Aviva Plus w/Device Kit   Accu-Chek Aviva Soln   Accu-Chek Softclix Lancets lancets   freestyle lancets Use to check blood sugar TID   albuterol 108 (90 Base) MCG/ACT inhaler Commonly known as: VENTOLIN HFA INHALE TWO PUFFS INTO THE LUNGS EVERY FOUR HOURS AS NEEDED. SHAKE WELL BEFORE EACH USE.   amLODipine 5 MG tablet Commonly known as: NORVASC TAKE ONE (1) TABLET BY MOUTH EVERY DAY   aspirin 81 MG tablet Take 81 mg by mouth daily.   B-D SINGLE USE SWABS REGULAR Pads   cetirizine 10 MG tablet Commonly known as: ZYRTEC TAKE ONE TABLET BY  MOUTH AT BEDTIME.   diphenoxylate-atropine 2.5-0.025 MG tablet Commonly known as: LOMOTIL TAKE ONE TABLET BY MOUTH TWICE A DAY AS NEEDED FOR DIARRHEA DUE TO PROSTRATE RADIATION.   fluconazole 150 MG tablet Commonly known as: DIFLUCAN Take 1 tablet now and repeat in 1 week   fluticasone-salmeterol 250-50 MCG/ACT Aepb Commonly known as: Advair Diskus Inhale one puff po BID   FREESTYLE LITE test strip Generic drug: glucose blood USE TO TEST BLOOD GLUCOSE THREE TIMES DAILY   glipiZIDE 10 MG tablet Commonly known as: GLUCOTROL TAKE ONE  TABLET BY MOUTH TWICE A DAY   hydrochlorothiazide 25 MG tablet Commonly known as: HYDRODIURIL TAKE ONE (1) TABLET BY MOUTH EVERY DAY   insulin aspart 100 UNIT/ML FlexPen Commonly known as: NOVOLOG 8 units with lunch and dinner -may titrate up to 16  units   ketoconazole 2 % cream Commonly known as: NIZORAL Apply 1 application topically 2 (two) times daily.   Lantus SoloStar 100 UNIT/ML Solostar Pen Generic drug: insulin glargine 40 units qhs. May titrate up to 60 units   losartan 50 MG tablet Commonly known as: COZAAR TAKE ONE TABLET EACH DAY   Olopatadine HCl 0.2 % Soln Apply 1 drop to eye at bedtime as needed.   omeprazole 20 MG capsule Commonly known as: PRILOSEC 1 po bid for gerd   phenazopyridine 100 MG tablet Commonly known as: Pyridium Take 2 to 3 pills daily as needed.   pravastatin 40 MG tablet Commonly known as: PRAVACHOL Take 1 tablet (40 mg total) by mouth daily.   sildenafil 20 MG tablet Commonly known as: REVATIO TAKE UP TO THREE TABLETS BY MOUTH AS NEEDED   sucralfate 1 g tablet Commonly known as: CARAFATE TAKE ONE (1) TABLET BY MOUTH 3 TIMES DAILY FOR GERD.   Uribel 118 MG Caps Take 1 tablet 2 times per day prn.        Allergies:  Allergies  Allergen Reactions   Morphine And Related Other (See Comments)    Insomnia    Sulfa Antibiotics Rash    Family History: Family History  Problem Relation Age of Onset   Heart disease Father    Hypertension Father    Heart disease Brother    Anesthesia problems Neg Hx    Hypotension Neg Hx    Malignant hyperthermia Neg Hx    Pseudochol deficiency Neg Hx    Breast cancer Neg Hx    Prostate cancer Neg Hx    Colon cancer Neg Hx    Pancreatic cancer Neg Hx     Social History:  reports that he has been smoking cigarettes. He has a 60.00 pack-year smoking history. He has never used smokeless tobacco. He reports current alcohol use. He reports that he does not use drugs.  ROS: All other review of systems were reviewed and are negative except what is noted above in HPI  Physical Exam: BP 119/66   Pulse 68   Constitutional:  Alert and oriented, No acute distress. HEENT: [place] AT, moist mucus membranes.  Trachea midline, no  masses. Cardiovascular: No clubbing, cyanosis, or edema. Respiratory: Normal respiratory effort, no increased work of breathing. GI: Abdomen is soft, nontender, nondistended, no abdominal masses GU: No CVA tenderness.  Lymph: No cervical or inguinal lymphadenopathy. Skin: No rashes, bruises or suspicious lesions. Neurologic: Grossly intact, no focal deficits, moving all 4 extremities. Psychiatric: Normal mood and affect.  Laboratory Data: Lab Results  Component Value Date   WBC 6.9 03/30/2013   HGB 16.4 03/30/2013  HCT 45.8 03/30/2013   MCV 85.3 03/30/2013   PLT 247 03/30/2013    Lab Results  Component Value Date   CREATININE 1.03 07/16/2021    Lab Results  Component Value Date   PSA 3.68 07/07/2013    No results found for: "TESTOSTERONE"  Lab Results  Component Value Date   HGBA1C 9.2 (H) 07/16/2021    Urinalysis    Component Value Date/Time   APPEARANCEUR Clear 10/02/2021 1406   GLUCOSEU Negative 10/02/2021 1406   BILIRUBINUR Negative 10/02/2021 1406   PROTEINUR Negative 10/02/2021 1406   NITRITE Negative 10/02/2021 1406   LEUKOCYTESUR Negative 10/02/2021 1406    Lab Results  Component Value Date   LABMICR See below: 10/02/2021   WBCUA None seen 10/02/2021   LABEPIT None seen 10/02/2021   MUCUS Present 03/29/2021   BACTERIA None seen 10/02/2021    Pertinent Imaging:  No results found for this or any previous visit.  No results found for this or any previous visit.  No results found for this or any previous visit.  No results found for this or any previous visit.  No results found for this or any previous visit.  No results found for this or any previous visit.  No results found for this or any previous visit.  No results found for this or any previous visit.   Assessment & Plan:    1. Prostate cancer ([place]) RTC 6 months with PSA - Urinalysis, Routine w reflex microscopic  2. Nocturia -Continue uroxatral 20m qhs  3. Benign  prostatic hyperplasia with urinary obstruction -continue uroxatral 13mqhs  4. Nephrolithiasis -CT stone study   No follow-ups on file.  PaNicolette BangMD  CoK[facility] Francisc[facility] [place]

## 2022-04-09 NOTE — Patient Instructions (Signed)
Dietary Guidelines to Help Prevent Kidney Stones Kidney stones are deposits of minerals and salts that form inside your kidneys. Your risk of developing kidney stones may be greater depending on your diet, your lifestyle, the medicines you take, and whether you have certain medical conditions. Most people can lower their chances of developing kidney stones by following the instructions below. Your dietitian may give you more specific instructions depending on your overall health and the type of kidney stones you tend to develop. What are tips for following this plan? Reading food labels  Choose foods with "no salt added" or "low-salt" labels. Limit your salt (sodium) intake to less than 1,500 mg a day. Choose foods with calcium for each meal and snack. Try to eat about 300 mg of calcium at each meal. Foods that contain 200-500 mg of calcium a serving include: 8 oz (237 mL) of milk, calcium-fortifiednon-dairy milk, and calcium-fortifiedfruit juice. Calcium-fortified means that calcium has been added to these drinks. 8 oz (237 mL) of kefir, yogurt, and soy yogurt. 4 oz (114 g) of tofu. 1 oz (28 g) of cheese. 1 cup (150 g) of dried figs. 1 cup (91 g) of cooked broccoli. One 3 oz (85 g) can of sardines or mackerel. Most people need 1,000-1,500 mg of calcium a day. Talk to your dietitian about how much calcium is recommended for you. Shopping Buy plenty of fresh fruits and vegetables. Most people do not need to avoid fruits and vegetables, even if these foods contain nutrients that may contribute to kidney stones. When shopping for convenience foods, choose: Whole pieces of fruit. Pre-made salads with dressing on the side. Low-fat fruit and yogurt smoothies. Avoid buying frozen meals or prepared deli foods. These can be high in sodium. Look for foods with live cultures, such as yogurt and kefir. Choose high-fiber grains, such as whole-wheat breads, oat bran, and wheat cereals. Cooking Do not add  salt to food when cooking. Place a salt shaker on the table and allow each person to add his or her own salt to taste. Use vegetable protein, such as beans, textured vegetable protein (TVP), or tofu, instead of meat in pasta, casseroles, and soups. Meal planning Eat less salt, if told by your dietitian. To do this: Avoid eating processed or pre-made food. Avoid eating fast food. Eat less animal protein, including cheese, meat, poultry, or fish, if told by your dietitian. To do this: Limit the number of times you have meat, poultry, fish, or cheese each week. Eat a diet free of meat at least 2 days a week. Eat only one serving each day of meat, poultry, fish, or seafood. When you prepare animal protein, cut pieces into small portion sizes. For most meat and fish, one serving is about the size of the palm of your hand. Eat at least five servings of fresh fruits and vegetables each day. To do this: Keep fruits and vegetables on hand for snacks. Eat one piece of fruit or a handful of berries with breakfast. Have a salad and fruit at lunch. Have two kinds of vegetables at dinner. Limit foods that are high in a substance called oxalate. These include: Spinach (cooked), rhubarb, beets, sweet potatoes, and Swiss chard. Peanuts. Potato chips, french fries, and baked potatoes with skin on. Nuts and nut products. Chocolate. If you regularly take a diuretic medicine, make sure to eat at least 1 or 2 servings of fruits or vegetables that are high in potassium each day. These include: Avocado. Banana. Orange, prune,   carrot, or tomato juice. Baked potato. Cabbage. Beans and split peas. Lifestyle  Drink enough fluid to keep your urine pale yellow. This is the most important thing you can do. Spread your fluid intake throughout the day. If you drink alcohol: Limit how much you use to: 0-1 drink a day for women who are not pregnant. 0-2 drinks a day for men. Be aware of how much alcohol is in your  drink. In the [place] one drink equals one 12 oz bottle of beer (355 mL), one 5 oz glass of wine (148 mL), or one 1 oz glass of hard liquor (44 mL). Lose weight if told by your health care provider. Work with your dietitian to find an eating plan and weight loss strategies that work best for you. General information Talk to your health care provider and dietitian about taking daily supplements. You may be told the following depending on your health and the cause of your kidney stones: Not to take supplements with vitamin C. To take a calcium supplement. To take a daily probiotic supplement. To take other supplements such as magnesium, fish oil, or vitamin B6. Take over-the-counter and prescription medicines only as told by your health care provider. These include supplements. What foods should I limit? Limit your intake of the following foods, or eat them as told by your dietitian. Vegetables Spinach. Rhubarb. Beets. Canned vegetables. Pickles. Olives. Baked potatoes with skin. Grains Wheat bran. Baked goods. Salted crackers. Cereals high in sugar. Meats and other proteins Nuts. Nut butters. Large portions of meat, poultry, or fish. Salted, precooked, or cured meats, such as sausages, meat loaves, and hot dogs. Dairy Cheese. Beverages Regular soft drinks. Regular vegetable juice. Seasonings and condiments Seasoning blends with salt. Salad dressings. Soy sauce. Ketchup. Barbecue sauce. Other foods Canned soups. Canned pasta sauce. Casseroles. Pizza. Lasagna. Frozen meals. Potato chips. French fries. The items listed above may not be a complete list of foods and beverages you should limit. Contact a dietitian for more information. What foods should I avoid? Talk to your dietitian about specific foods you should avoid based on the type of kidney stones you have and your overall health. Fruits Grapefruit. The item listed above may not be a complete list of foods and beverages you should  avoid. Contact a dietitian for more information. Summary Kidney stones are deposits of minerals and salts that form inside your kidneys. You can lower your risk of kidney stones by making changes to your diet. The most important thing you can do is drink enough fluid. Drink enough fluid to keep your urine pale yellow. Talk to your dietitian about how much calcium you should have each day, and eat less salt and animal protein as told by your dietitian. This information is not intended to replace advice given to you by your health care provider. Make sure you discuss any questions you have with your health care provider. Document Revised: 06/03/2021 Document Reviewed: 06/03/2021 Elsevier Patient Education  2023 [employer]  

## 2022-04-21 ENCOUNTER — Ambulatory Visit (HOSPITAL_COMMUNITY)
Admission: RE | Admit: 2022-04-21 | Discharge: 2022-04-21 | Disposition: A | Discharge status: Home / Self Care | Payer: HMO | Source: Ambulatory Visit | Attending: Urology | Admitting: Urology

## 2022-04-21 DIAGNOSIS — R300 Dysuria: Secondary | ICD-10-CM | POA: Diagnosis not present

## 2022-04-21 DIAGNOSIS — N3289 Other specified disorders of bladder: Secondary | ICD-10-CM | POA: Diagnosis not present

## 2022-04-21 DIAGNOSIS — K8020 Calculus of gallbladder without cholecystitis without obstruction: Secondary | ICD-10-CM | POA: Diagnosis not present

## 2022-04-21 DIAGNOSIS — N200 Calculus of kidney: Secondary | ICD-10-CM | POA: Diagnosis not present

## 2022-04-23 ENCOUNTER — Other Ambulatory Visit: Payer: Self-pay

## 2022-04-23 ENCOUNTER — Ambulatory Visit (INDEPENDENT_AMBULATORY_CARE_PROVIDER_SITE_OTHER): Payer: HMO | Admitting: Urology

## 2022-04-23 VITALS — BP 108/66 | HR 73

## 2022-04-23 DIAGNOSIS — N200 Calculus of kidney: Secondary | ICD-10-CM

## 2022-04-23 DIAGNOSIS — N411 Chronic prostatitis: Secondary | ICD-10-CM

## 2022-04-23 LAB — URINALYSIS, ROUTINE W REFLEX MICROSCOPIC
Bilirubin, UA: NEGATIVE
Leukocytes,UA: NEGATIVE
Nitrite, UA: NEGATIVE
Protein,UA: NEGATIVE
RBC, UA: NEGATIVE
Specific Gravity, UA: 1.025 (ref 1.005–1.030)
Urobilinogen, Ur: 0.2 mg/dL (ref 0.2–1.0)
pH, UA: 5.5 (ref 5.0–7.5)

## 2022-04-23 MED ORDER — DOXYCYCLINE HYCLATE 100 MG PO CAPS: 100.0000 mg | ORAL_CAPSULE | Freq: Two times a day (BID) | ORAL | 0 refills | Status: DC

## 2022-04-23 NOTE — Progress Notes (Signed)
04/23/2022 3:48 PM   Roger Phillips 1956-06-20 [id number]  Referring provider: Kathyrn Drown, MD [address] [place],  [place] [address]  Followup nephrolithiasis   HPI: Ms Barren is a 66yo here for followup for nephrolithiasis. CT stone study from 04/21/2022 shows bilateral renal calculi, no ureteral calculi and no hydronephrosis. He denies any flank pain. No worsening lUTS.    PMH: Past Medical History:  Diagnosis Date   Arthritis    neck and left shoulder   Asthma    Back pain    buldging disc   Complication of anesthesia    difficult to wake up after neck surgery   COPD (chronic obstructive pulmonary disease) (HCC)    Depression    takes Wellbutrin   Diabetes mellitus    takes Metformin bid   Emphysema    GERD (gastroesophageal reflux disease)    takes Omeprazole daily   Hemorrhoids    High cholesterol    takes Pravastatin daily   HTN (hypertension)    takes Lisinopril,Norvasc,and HCTZ daily   IBS (irritable bowel syndrome)    Insomnia    doesn't take any meds    Pneumonia    hx of in 80's and 90's   Prostate cancer (HCC)    Sinus headache    sinus drainage    Surgical History: Past Surgical History:  Procedure Laterality Date   ANTERIOR CERVICAL DECOMP/DISCECTOMY FUSION  12/04/2011   Procedure: ANTERIOR CERVICAL DECOMPRESSION/DISCECTOMY FUSION 2 LEVELS;  Surgeon: Hosie Spangle, MD;  Location: [facility];  Service: Neurosurgery;  Laterality: N/A;  Cervical Three-Four, Cervical Four-Five anterior cervial decompression with fusion plating and bonegraft   CARPAL TUNNEL RELEASE  10+yrs   right side   COLONOSCOPY N/A 04/19/2014   Procedure: COLONOSCOPY;  Surgeon: Rogene Houston, MD;  [facility];  Service: Endoscopy;  Laterality: N/A;  1030-moved to 1230 Ann to notify pt   CYSTECTOMY  12-49yr ago   from left side of neck   GOLD SEED IMPLANT N/A 12/06/2020   Procedure: [place]  Surgeon: MCleon Gustin MD;   Location: [facility];  Service: Urology;  Laterality: N/A;   SPACE OAR INSTILLATION N/A 12/06/2020   Procedure: SPACE OAR INSTILLATION;  Surgeon: MCleon Gustin MD;  Location: [facility];  Service: Urology;  Laterality: N/A;    Home Medications:  Allergies as of 04/23/2022       Reactions   Morphine And Related Other (See Comments)   Insomnia   Sulfa Antibiotics Rash        Medication List        Accurate as of April 23, 2022  3:48 PM. If you have any questions, ask your nurse or doctor.          Accu-Chek Aviva Plus w/Device Kit   Accu-Chek Aviva Soln   Accu-Chek Softclix Lancets lancets   freestyle lancets Use to check blood sugar TID   albuterol 108 (90 Base) MCG/ACT inhaler Commonly known as: VENTOLIN HFA INHALE TWO PUFFS INTO THE LUNGS EVERY FOUR HOURS AS NEEDED. SHAKE WELL BEFORE EACH USE.   alfuzosin 10 MG 24 hr tablet Commonly known as: UROXATRAL Take 1 tablet (10 mg total) by mouth at bedtime.   amLODipine 5 MG tablet Commonly known as: NORVASC TAKE ONE (1) TABLET BY MOUTH EVERY DAY   aspirin 81 MG tablet Take 81 mg by mouth daily.   B-D SINGLE USE SWABS REGULAR Pads   cetirizine 10 MG  tablet Commonly known as: ZYRTEC TAKE ONE TABLET BY MOUTH AT BEDTIME.   diphenoxylate-atropine 2.5-0.025 MG tablet Commonly known as: LOMOTIL TAKE ONE TABLET BY MOUTH TWICE A DAY AS NEEDED FOR DIARRHEA DUE TO PROSTRATE RADIATION.   doxycycline 100 MG capsule Commonly known as: VIBRAMYCIN Take 1 capsule (100 mg total) by mouth every 12 (twelve) hours.   fluconazole 150 MG tablet Commonly known as: DIFLUCAN Take 1 tablet now and repeat in 1 week   fluticasone-salmeterol 250-50 MCG/ACT Aepb Commonly known as: Advair Diskus Inhale one puff po BID   FREESTYLE LITE test strip Generic drug: glucose blood USE TO TEST BLOOD GLUCOSE THREE TIMES DAILY   glipiZIDE 10 MG tablet Commonly known as: GLUCOTROL TAKE ONE  TABLET BY MOUTH TWICE A DAY   hydrochlorothiazide  25 MG tablet Commonly known as: HYDRODIURIL TAKE ONE (1) TABLET BY MOUTH EVERY DAY   insulin aspart 100 UNIT/ML FlexPen Commonly known as: NOVOLOG 8 units with lunch and dinner -may titrate up to 16 units   ketoconazole 2 % cream Commonly known as: NIZORAL Apply 1 application topically 2 (two) times daily.   Lantus SoloStar 100 UNIT/ML Solostar Pen Generic drug: insulin glargine 40 units qhs. May titrate up to 60 units   losartan 50 MG tablet Commonly known as: COZAAR TAKE ONE TABLET EACH DAY   Olopatadine HCl 0.2 % Soln Apply 1 drop to eye at bedtime as needed.   omeprazole 20 MG capsule Commonly known as: PRILOSEC 1 po bid for gerd   phenazopyridine 100 MG tablet Commonly known as: Pyridium Take 2 to 3 pills daily as needed.   pravastatin 40 MG tablet Commonly known as: PRAVACHOL Take 1 tablet (40 mg total) by mouth daily.   sildenafil 20 MG tablet Commonly known as: REVATIO TAKE UP TO THREE TABLETS BY MOUTH AS NEEDED   sucralfate 1 g tablet Commonly known as: CARAFATE TAKE ONE (1) TABLET BY MOUTH 3 TIMES DAILY FOR GERD.   Uribel 118 MG Caps Take 1 tablet 2 times per day prn.        Allergies:  Allergies  Allergen Reactions   Morphine And Related Other (See Comments)    Insomnia    Sulfa Antibiotics Rash    Family History: Family History  Problem Relation Age of Onset   Heart disease Father    Hypertension Father    Heart disease Brother    Anesthesia problems Neg Hx    Hypotension Neg Hx    Malignant hyperthermia Neg Hx    Pseudochol deficiency Neg Hx    Breast cancer Neg Hx    Prostate cancer Neg Hx    Colon cancer Neg Hx    Pancreatic cancer Neg Hx     Social History:  reports that he has been smoking cigarettes. He has a 60.00 pack-year smoking history. He has never used smokeless tobacco. He reports current alcohol use. He reports that he does not use drugs.  ROS: All other review of systems were reviewed and are negative except  what is noted above in HPI  Physical Exam: BP 108/66   Pulse 73   Constitutional:  Alert and oriented, No acute distress. HEENT: [place] AT, moist mucus membranes.  Trachea midline, no masses. Cardiovascular: No clubbing, cyanosis, or edema. Respiratory: Normal respiratory effort, no increased work of breathing. GI: Abdomen is soft, nontender, nondistended, no abdominal masses GU: No CVA tenderness.  Lymph: No cervical or inguinal lymphadenopathy. Skin: No rashes, bruises or suspicious lesions. Neurologic: Grossly intact,  no focal deficits, moving all 4 extremities. Psychiatric: Normal mood and affect.  Laboratory Data: Lab Results  Component Value Date   WBC 6.9 03/30/2013   HGB 16.4 03/30/2013   HCT 45.8 03/30/2013   MCV 85.3 03/30/2013   PLT 247 03/30/2013    Lab Results  Component Value Date   CREATININE 1.03 07/16/2021    Lab Results  Component Value Date   PSA 3.68 07/07/2013    No results found for: "TESTOSTERONE"  Lab Results  Component Value Date   HGBA1C 9.2 (H) 07/16/2021    Urinalysis    Component Value Date/Time   APPEARANCEUR Clear 04/09/2022 1555   GLUCOSEU 3+ (A) 04/09/2022 1555   BILIRUBINUR Negative 04/09/2022 1555   PROTEINUR Trace (A) 04/09/2022 1555   NITRITE Negative 04/09/2022 1555   LEUKOCYTESUR Negative 04/09/2022 1555    Lab Results  Component Value Date   LABMICR See below: 04/09/2022   WBCUA None seen 04/09/2022   LABEPIT 0-10 04/09/2022   MUCUS Present 04/09/2022   BACTERIA Few 04/09/2022    Pertinent Imaging: CT stone study 04/21/2022: Images reviewed and discussed with the patient  No results found for this or any previous visit.  No results found for this or any previous visit.  No results found for this or any previous visit.  No results found for this or any previous visit.  No results found for this or any previous visit.  No results found for this or any previous visit.  No results found for this or any  previous visit.  Results for orders placed during the hospital encounter of 04/21/22  CT RENAL STONE STUDY  Narrative CLINICAL DATA:  Nephrolithiasis.  Dysuria.  EXAM: CT ABDOMEN AND PELVIS WITHOUT CONTRAST  TECHNIQUE: Multidetector CT imaging of the abdomen and pelvis was performed following the standard protocol without IV contrast.  RADIATION DOSE REDUCTION: This exam was performed according to the departmental dose-optimization program which includes automated exposure control, adjustment of the mA and/or kV according to patient size and/or use of iterative reconstruction technique.  COMPARISON:  Overlapping portions CT chest 07/28/2018  FINDINGS: Lower chest: Lingular bulla, image 6 series 4. Mild scarring or atelectasis in the right lower lobe. Descending thoracic aortic atherosclerosis and circumflex coronary artery atherosclerosis.  Hepatobiliary: Multiple gallstones measuring up to 0.7 cm in diameter. No biliary dilatation. Otherwise unremarkable.  Pancreas: Unremarkable  Spleen: Unremarkable  Adrenals/Urinary Tract: Both adrenal glands appear normal.  There are proximally 8 nonobstructive right renal calculi, one of the largest in the right kidney lower pole measures 0.8 cm in long axis.  Two left kidney lower pole calculi are identified, the larger measuring 0.5 cm in short axis.  No hydronephrosis or hydroureter. No ureteral or bladder calculus identified. Wall thickening in the urinary bladder is least partially from nondistention but there could certainly be a component of cystitis.  Stomach/Bowel: Unremarkable  Vascular/Lymphatic: Atherosclerosis is present, including aortoiliac atherosclerotic disease. No pathologic adenopathy observed.  Reproductive: Prostatomegaly. Fiducials noted along the prostate margins.  Other: No supplemental non-categorized findings.  Musculoskeletal: Mild bridging spurring anteriorly in the sacroiliac joints. Mild  bilateral degenerative hip arthropathy. Lumbar spondylosis and degenerative disc disease causing impingement especially at L4-5.  IMPRESSION: 1. Bilateral nonobstructive renal calculi. No hydronephrosis, hydroureter, or ureteral/bladder calculus. 2. Bladder wall thickening may partially be due to nondistention but superimposed cystitis is not excluded. 3. Prostatomegaly with fiducials in place. 4. Other imaging findings of potential clinical significance: Aortic Atherosclerosis (ICD10-I70.0). Coronary atherosclerosis. Cholelithiasis. Mild bilateral  degenerative hip arthropathy. Lumbar impingement especially at L4-5.   Electronically Signed By: Van Clines M.D. On: 04/22/2022 14:52   Assessment & Plan:    1. Nephrolithiasis -RTC 3 months with KUB - Urinalysis, Routine w reflex microscopic     No follow-ups on file.  Nicolette Bang, MD  [facility] [place]

## 2022-04-24 ENCOUNTER — Other Ambulatory Visit: Payer: Self-pay

## 2022-04-24 ENCOUNTER — Other Ambulatory Visit (HOSPITAL_COMMUNITY): Payer: HMO

## 2022-04-24 DIAGNOSIS — R351 Nocturia: Secondary | ICD-10-CM

## 2022-04-24 MED ORDER — ALFUZOSIN HCL ER 10 MG PO TB24: 10.0000 mg | ORAL_TABLET | Freq: Every day | ORAL | 11 refills | Status: DC

## 2022-04-24 NOTE — Progress Notes (Signed)
Wife requested rx be sent to different pharmacy

## 2022-04-29 ENCOUNTER — Encounter: Payer: Self-pay | Admitting: Urology

## 2022-04-29 NOTE — Patient Instructions (Signed)
Dietary Guidelines to Help Prevent Kidney Stones Kidney stones are deposits of minerals and salts that form inside your kidneys. Your risk of developing kidney stones may be greater depending on your diet, your lifestyle, the medicines you take, and whether you have certain medical conditions. Most people can lower their chances of developing kidney stones by following the instructions below. Your dietitian may give you more specific instructions depending on your overall health and the type of kidney stones you tend to develop. What are tips for following this plan? Reading food labels  Choose foods with "no salt added" or "low-salt" labels. Limit your salt (sodium) intake to less than 1,500 mg a day. Choose foods with calcium for each meal and snack. Try to eat about 300 mg of calcium at each meal. Foods that contain 200-500 mg of calcium a serving include: 8 oz (237 mL) of milk, calcium-fortifiednon-dairy milk, and calcium-fortifiedfruit juice. Calcium-fortified means that calcium has been added to these drinks. 8 oz (237 mL) of kefir, yogurt, and soy yogurt. 4 oz (114 g) of tofu. 1 oz (28 g) of cheese. 1 cup (150 g) of dried figs. 1 cup (91 g) of cooked broccoli. One 3 oz (85 g) can of sardines or mackerel. Most people need 1,000-1,500 mg of calcium a day. Talk to your dietitian about how much calcium is recommended for you. Shopping Buy plenty of fresh fruits and vegetables. Most people do not need to avoid fruits and vegetables, even if these foods contain nutrients that may contribute to kidney stones. When shopping for convenience foods, choose: Whole pieces of fruit. Pre-made salads with dressing on the side. Low-fat fruit and yogurt smoothies. Avoid buying frozen meals or prepared deli foods. These can be high in sodium. Look for foods with live cultures, such as yogurt and kefir. Choose high-fiber grains, such as whole-wheat breads, oat bran, and wheat cereals. Cooking Do not add  salt to food when cooking. Place a salt shaker on the table and allow each person to add his or her own salt to taste. Use vegetable protein, such as beans, textured vegetable protein (TVP), or tofu, instead of meat in pasta, casseroles, and soups. Meal planning Eat less salt, if told by your dietitian. To do this: Avoid eating processed or pre-made food. Avoid eating fast food. Eat less animal protein, including cheese, meat, poultry, or fish, if told by your dietitian. To do this: Limit the number of times you have meat, poultry, fish, or cheese each week. Eat a diet free of meat at least 2 days a week. Eat only one serving each day of meat, poultry, fish, or seafood. When you prepare animal protein, cut pieces into small portion sizes. For most meat and fish, one serving is about the size of the palm of your hand. Eat at least five servings of fresh fruits and vegetables each day. To do this: Keep fruits and vegetables on hand for snacks. Eat one piece of fruit or a handful of berries with breakfast. Have a salad and fruit at lunch. Have two kinds of vegetables at dinner. Limit foods that are high in a substance called oxalate. These include: Spinach (cooked), rhubarb, beets, sweet potatoes, and Swiss chard. Peanuts. Potato chips, french fries, and baked potatoes with skin on. Nuts and nut products. Chocolate. If you regularly take a diuretic medicine, make sure to eat at least 1 or 2 servings of fruits or vegetables that are high in potassium each day. These include: Avocado. Banana. Orange, prune,   carrot, or tomato juice. Baked potato. Cabbage. Beans and split peas. Lifestyle  Drink enough fluid to keep your urine pale yellow. This is the most important thing you can do. Spread your fluid intake throughout the day. If you drink alcohol: Limit how much you use to: 0-1 drink a day for women who are not pregnant. 0-2 drinks a day for men. Be aware of how much alcohol is in your  drink. In the [place] one drink equals one 12 oz bottle of beer (355 mL), one 5 oz glass of wine (148 mL), or one 1 oz glass of hard liquor (44 mL). Lose weight if told by your health care provider. Work with your dietitian to find an eating plan and weight loss strategies that work best for you. General information Talk to your health care provider and dietitian about taking daily supplements. You may be told the following depending on your health and the cause of your kidney stones: Not to take supplements with vitamin C. To take a calcium supplement. To take a daily probiotic supplement. To take other supplements such as magnesium, fish oil, or vitamin B6. Take over-the-counter and prescription medicines only as told by your health care provider. These include supplements. What foods should I limit? Limit your intake of the following foods, or eat them as told by your dietitian. Vegetables Spinach. Rhubarb. Beets. Canned vegetables. Pickles. Olives. Baked potatoes with skin. Grains Wheat bran. Baked goods. Salted crackers. Cereals high in sugar. Meats and other proteins Nuts. Nut butters. Large portions of meat, poultry, or fish. Salted, precooked, or cured meats, such as sausages, meat loaves, and hot dogs. Dairy Cheese. Beverages Regular soft drinks. Regular vegetable juice. Seasonings and condiments Seasoning blends with salt. Salad dressings. Soy sauce. Ketchup. Barbecue sauce. Other foods Canned soups. Canned pasta sauce. Casseroles. Pizza. Lasagna. Frozen meals. Potato chips. French fries. The items listed above may not be a complete list of foods and beverages you should limit. Contact a dietitian for more information. What foods should I avoid? Talk to your dietitian about specific foods you should avoid based on the type of kidney stones you have and your overall health. Fruits Grapefruit. The item listed above may not be a complete list of foods and beverages you should  avoid. Contact a dietitian for more information. Summary Kidney stones are deposits of minerals and salts that form inside your kidneys. You can lower your risk of kidney stones by making changes to your diet. The most important thing you can do is drink enough fluid. Drink enough fluid to keep your urine pale yellow. Talk to your dietitian about how much calcium you should have each day, and eat less salt and animal protein as told by your dietitian. This information is not intended to replace advice given to you by your health care provider. Make sure you discuss any questions you have with your health care provider. Document Revised: 06/03/2021 Document Reviewed: 06/03/2021 Elsevier Patient Education  2023 [employer]  

## 2022-05-05 ENCOUNTER — Other Ambulatory Visit: Payer: Self-pay | Admitting: Family Medicine

## 2022-05-19 ENCOUNTER — Ambulatory Visit: Payer: Self-pay | Admitting: Family Medicine

## 2022-05-28 ENCOUNTER — Ambulatory Visit (INDEPENDENT_AMBULATORY_CARE_PROVIDER_SITE_OTHER): Payer: HMO | Admitting: Urology

## 2022-05-28 VITALS — BP 128/74 | HR 74

## 2022-05-28 DIAGNOSIS — N411 Chronic prostatitis: Secondary | ICD-10-CM

## 2022-05-28 DIAGNOSIS — R300 Dysuria: Secondary | ICD-10-CM | POA: Diagnosis not present

## 2022-05-28 MED ORDER — SILODOSIN 8 MG PO CAPS: 8.0000 mg | ORAL_CAPSULE | Freq: Every day | ORAL | 11 refills | Status: DC

## 2022-05-28 MED ORDER — PHENAZOPYRIDINE HCL 100 MG PO TABS: 100.0000 mg | ORAL_TABLET | Freq: Three times a day (TID) | ORAL | 1 refills | Status: DC | PRN

## 2022-05-28 NOTE — Patient Instructions (Signed)

## 2022-05-28 NOTE — Progress Notes (Signed)
05/28/2022 3:57 PM   Roger Phillips [birth year] [id number]  Referring provider: Kathyrn Drown, MD [address] [place],  [place] [address]  Followup dysuria and chronic prostatitis   HPI: Roger Phillips is a 66yo here for followup for dysuria and chronic prostatitis. He denies any worsening LUTS. Dysuria has improved since last visit. He uses AZO prn with good results. Urine stream strong. No straining to urinate   PMH: Past Medical History:  Diagnosis Date   Arthritis    neck and left shoulder   Asthma    Back pain    buldging disc   Complication of anesthesia    difficult to wake up after neck surgery   COPD (chronic obstructive pulmonary disease) (HCC)    Depression    takes Wellbutrin   Diabetes mellitus    takes Metformin bid   Emphysema    GERD (gastroesophageal reflux disease)    takes Omeprazole daily   Hemorrhoids    High cholesterol    takes Pravastatin daily   HTN (hypertension)    takes Lisinopril,Norvasc,and HCTZ daily   IBS (irritable bowel syndrome)    Insomnia    doesn't take any meds    Pneumonia    hx of in 80's and 90's   Prostate cancer ([place])    Sinus headache    sinus drainage    Surgical History: Past Surgical History:  Procedure Laterality Date   ANTERIOR CERVICAL DECOMP/DISCECTOMY FUSION  12/04/2011   Procedure: ANTERIOR CERVICAL DECOMPRESSION/DISCECTOMY FUSION 2 LEVELS;  Surgeon: Hosie Spangle, MD;  Location: [facility];  Service: Neurosurgery;  Laterality: N/A;  Cervical Three-Four, Cervical Four-Five anterior cervial decompression with fusion plating and bonegraft   CARPAL TUNNEL RELEASE  10+yrs   right side   COLONOSCOPY N/A 04/19/2014   Procedure: COLONOSCOPY;  Surgeon: Rogene Houston, MD;  [facility];  Service: Endoscopy;  Laterality: N/A;  1030-moved to 1230 Ann to notify pt   CYSTECTOMY  12-49yr ago   from left side of neck   GOLD SEED IMPLANT N/A 12/06/2020   Procedure: [place]   Surgeon: MCleon Gustin MD;  Location: [facility];  Service: Urology;  Laterality: N/A;   SPACE OAR INSTILLATION N/A 12/06/2020   Procedure: SPACE OAR INSTILLATION;  Surgeon: MCleon Gustin MD;  Location: [facility];  Service: Urology;  Laterality: N/A;    Home Medications:  Allergies as of 05/28/2022       Reactions   Doxycycline Diarrhea   Morphine And Related Other (See Comments)   Insomnia   Sulfa Antibiotics Rash        Medication List        Accurate as of May 28, 2022  3:57 PM. If you have any questions, ask your nurse or doctor.          Accu-Chek Aviva Plus w/Device Kit   Accu-Chek Aviva Soln   Accu-Chek Softclix Lancets lancets   freestyle lancets Use to check blood sugar TID   albuterol 108 (90 Base) MCG/ACT inhaler Commonly known as: VENTOLIN HFA INHALE TWO PUFFS INTO THE LUNGS EVERY FOUR HOURS AS NEEDED. SHAKE WELL BEFORE EACH USE.   alfuzosin 10 MG 24 hr tablet Commonly known as: UROXATRAL Take 1 tablet (10 mg total) by mouth at bedtime.   amLODipine 5 MG tablet Commonly known as: NORVASC TAKE ONE (1) TABLET BY MOUTH EVERY DAY   aspirin 81 MG tablet Take 81 mg by mouth daily.  B-D SINGLE USE SWABS REGULAR Pads   cetirizine 10 MG tablet Commonly known as: ZYRTEC TAKE ONE TABLET BY MOUTH AT BEDTIME.   diphenoxylate-atropine 2.5-0.025 MG tablet Commonly known as: LOMOTIL TAKE ONE TABLET BY MOUTH TWICE A DAY AS NEEDED FOR DIARRHEA DUE TO PROSTRATE RADIATION.   doxycycline 100 MG capsule Commonly known as: VIBRAMYCIN Take 1 capsule (100 mg total) by mouth every 12 (twelve) hours.   fluconazole 150 MG tablet Commonly known as: DIFLUCAN Take 1 tablet now and repeat in 1 week   fluticasone-salmeterol 250-50 MCG/ACT Aepb Commonly known as: Advair Diskus Inhale one puff po BID   FREESTYLE LITE test strip Generic drug: glucose blood USE TO TEST BLOOD GLUCOSE THREE TIMES DAILY   glipiZIDE 10 MG tablet Commonly known as:  GLUCOTROL TAKE ONE  TABLET BY MOUTH TWICE A DAY   hydrochlorothiazide 25 MG tablet Commonly known as: HYDRODIURIL TAKE 1 TABLET BY MOUTH DAILY.   insulin aspart 100 UNIT/ML FlexPen Commonly known as: NOVOLOG 8 units with lunch and dinner -may titrate up to 16 units   ketoconazole 2 % cream Commonly known as: NIZORAL Apply 1 application topically 2 (two) times daily.   Lantus SoloStar 100 UNIT/ML Solostar Pen Generic drug: insulin glargine 40 units qhs. May titrate up to 60 units   losartan 50 MG tablet Commonly known as: COZAAR TAKE 1 TABLET BY MOUTH DAILY.   Olopatadine HCl 0.2 % Soln Apply 1 drop to eye at bedtime as needed.   omeprazole 20 MG capsule Commonly known as: PRILOSEC 1 po bid for gerd   phenazopyridine 100 MG tablet Commonly known as: Pyridium Take 2 to 3 pills daily as needed.   pravastatin 40 MG tablet Commonly known as: PRAVACHOL Take 1 tablet (40 mg total) by mouth daily.   sildenafil 20 MG tablet Commonly known as: REVATIO TAKE UP TO THREE TABLETS BY MOUTH AS NEEDED   sucralfate 1 g tablet Commonly known as: CARAFATE TAKE ONE (1) TABLET BY MOUTH 3 TIMES DAILY FOR GERD.   Uribel 118 MG Caps Take 1 tablet 2 times per day prn.        Allergies:  Allergies  Allergen Reactions   Doxycycline Diarrhea   Morphine And Related Other (See Comments)    Insomnia    Sulfa Antibiotics Rash    Family History: Family History  Problem Relation Age of Onset   Heart disease Father    Hypertension Father    Heart disease Brother    Anesthesia problems Neg Hx    Hypotension Neg Hx    Malignant hyperthermia Neg Hx    Pseudochol deficiency Neg Hx    Breast cancer Neg Hx    Prostate cancer Neg Hx    Colon cancer Neg Hx    Pancreatic cancer Neg Hx     Social History:  reports that he has been smoking cigarettes. He has a 60.00 pack-year smoking history. He has never used smokeless tobacco. He reports current alcohol use. He reports that he does  not use drugs.  ROS: All other review of systems were reviewed and are negative except what is noted above in HPI  Physical Exam: BP 128/74   Pulse 74   Constitutional:  Alert and oriented, No acute distress. HEENT: [place] AT, moist mucus membranes.  Trachea midline, no masses. Cardiovascular: No clubbing, cyanosis, or edema. Respiratory: Normal respiratory effort, no increased work of breathing. GI: Abdomen is soft, nontender, nondistended, no abdominal masses GU: No CVA tenderness.  Lymph: No  cervical or inguinal lymphadenopathy. Skin: No rashes, bruises or suspicious lesions. Neurologic: Grossly intact, no focal deficits, moving all 4 extremities. Psychiatric: Normal mood and affect.  Laboratory Data: Lab Results  Component Value Date   WBC 6.9 03/30/2013   HGB 16.4 03/30/2013   HCT 45.8 03/30/2013   MCV 85.3 03/30/2013   PLT 247 03/30/2013    Lab Results  Component Value Date   CREATININE 1.03 07/16/2021    Lab Results  Component Value Date   PSA 3.68 07/07/2013    No results found for: "TESTOSTERONE"  Lab Results  Component Value Date   HGBA1C 9.2 (H) 07/16/2021    Urinalysis    Component Value Date/Time   APPEARANCEUR Clear 04/23/2022 1531   GLUCOSEU 3+ (A) 04/23/2022 1531   BILIRUBINUR Negative 04/23/2022 1531   PROTEINUR Negative 04/23/2022 1531   NITRITE Negative 04/23/2022 1531   LEUKOCYTESUR Negative 04/23/2022 1531    Lab Results  Component Value Date   LABMICR Comment 04/23/2022   WBCUA None seen 04/09/2022   LABEPIT 0-10 04/09/2022   MUCUS Present 04/09/2022   BACTERIA Few 04/09/2022    Pertinent Imaging:  No results found for this or any previous visit.  No results found for this or any previous visit.  No results found for this or any previous visit.  No results found for this or any previous visit.  No results found for this or any previous visit.  No results found for this or any previous visit.  No results found for this  or any previous visit.  Results for orders placed during the hospital encounter of 04/21/22  CT RENAL STONE STUDY  Narrative CLINICAL DATA:  Nephrolithiasis.  Dysuria.  EXAM: CT ABDOMEN AND PELVIS WITHOUT CONTRAST  TECHNIQUE: Multidetector CT imaging of the abdomen and pelvis was performed following the standard protocol without IV contrast.  RADIATION DOSE REDUCTION: This exam was performed according to the departmental dose-optimization program which includes automated exposure control, adjustment of the mA and/or kV according to patient size and/or use of iterative reconstruction technique.  COMPARISON:  Overlapping portions CT chest 07/28/2018  FINDINGS: Lower chest: Lingular bulla, image 6 series 4. Mild scarring or atelectasis in the right lower lobe. Descending thoracic aortic atherosclerosis and circumflex coronary artery atherosclerosis.  Hepatobiliary: Multiple gallstones measuring up to 0.7 cm in diameter. No biliary dilatation. Otherwise unremarkable.  Pancreas: Unremarkable  Spleen: Unremarkable  Adrenals/Urinary Tract: Both adrenal glands appear normal.  There are proximally 8 nonobstructive right renal calculi, one of the largest in the right kidney lower pole measures 0.8 cm in long axis.  Two left kidney lower pole calculi are identified, the larger measuring 0.5 cm in short axis.  No hydronephrosis or hydroureter. No ureteral or bladder calculus identified. Wall thickening in the urinary bladder is least partially from nondistention but there could certainly be a component of cystitis.  Stomach/Bowel: Unremarkable  Vascular/Lymphatic: Atherosclerosis is present, including aortoiliac atherosclerotic disease. No pathologic adenopathy observed.  Reproductive: Prostatomegaly. Fiducials noted along the prostate margins.  Other: No supplemental non-categorized findings.  Musculoskeletal: Mild bridging spurring anteriorly in the  sacroiliac joints. Mild bilateral degenerative hip arthropathy. Lumbar spondylosis and degenerative disc disease causing impingement especially at L4-5.  IMPRESSION: 1. Bilateral nonobstructive renal calculi. No hydronephrosis, hydroureter, or ureteral/bladder calculus. 2. Bladder wall thickening may partially be due to nondistention but superimposed cystitis is not excluded. 3. Prostatomegaly with fiducials in place. 4. Other imaging findings of potential clinical significance: Aortic Atherosclerosis (ICD10-I70.0). Coronary atherosclerosis. Cholelithiasis. Mild  bilateral degenerative hip arthropathy. Lumbar impingement especially at L4-5.   Electronically Signed By: Van Clines M.D. On: 04/22/2022 14:52   Assessment & Plan:    1. Chronic prostatitis without hematuria -pyridium 154m   2. Burning with urination Pyridium 1067mprn   No follow-ups on file.  PaNicolette BangMD  [facility] [place]

## 2022-05-29 LAB — URINALYSIS, ROUTINE W REFLEX MICROSCOPIC
Bilirubin, UA: NEGATIVE
Leukocytes,UA: NEGATIVE
Nitrite, UA: NEGATIVE
RBC, UA: NEGATIVE
Specific Gravity, UA: 1.025 (ref 1.005–1.030)
Urobilinogen, Ur: 0.2 mg/dL (ref 0.2–1.0)
pH, UA: 5.5 (ref 5.0–7.5)

## 2022-05-29 LAB — MICROSCOPIC EXAMINATION
Epithelial Cells (non renal): NONE SEEN /hpf (ref 0–10)
RBC, Urine: NONE SEEN /hpf (ref 0–2)
WBC, UA: NONE SEEN /hpf (ref 0–5)

## 2022-06-05 ENCOUNTER — Encounter: Payer: Self-pay | Admitting: Urology

## 2022-06-16 ENCOUNTER — Other Ambulatory Visit: Payer: Self-pay | Admitting: Family Medicine

## 2022-06-30 ENCOUNTER — Other Ambulatory Visit: Payer: Self-pay | Admitting: Family Medicine

## 2022-07-07 ENCOUNTER — Other Ambulatory Visit: Payer: Self-pay | Admitting: Urology

## 2022-07-07 DIAGNOSIS — R300 Dysuria: Secondary | ICD-10-CM

## 2022-07-08 ENCOUNTER — Ambulatory Visit: Payer: HMO | Admitting: Family Medicine

## 2022-07-23 ENCOUNTER — Ambulatory Visit (INDEPENDENT_AMBULATORY_CARE_PROVIDER_SITE_OTHER): Payer: HMO | Admitting: Urology

## 2022-07-23 ENCOUNTER — Encounter: Payer: Self-pay | Admitting: Urology

## 2022-07-23 DIAGNOSIS — R300 Dysuria: Secondary | ICD-10-CM | POA: Diagnosis not present

## 2022-07-23 DIAGNOSIS — N411 Chronic prostatitis: Secondary | ICD-10-CM

## 2022-07-23 MED ORDER — AMOXICILLIN-POT CLAVULANATE 875-125 MG PO TABS: 1.0000 | ORAL_TABLET | Freq: Two times a day (BID) | ORAL | 0 refills | Status: DC

## 2022-07-23 NOTE — Patient Instructions (Signed)
Prostatitis  Prostatitis is swelling of the prostate gland, also called the prostate. This gland is about 1.5 inches wide and 1 inch high, and it helps to make a fluid called semen. The prostate is below a man's bladder, in front of the butt (rectum). There are different types of prostatitis. What are the causes? One type of prostatitis is caused by an infection from germs (bacteria). Another type is not caused by germs. It may be caused by: Things having to do with the nervous system. This system includes thebrain, spinal cord, and nerves. An autoimmune response. This happens when the body's disease-fighting system attacks healthy tissue in the body by mistake. Psychological factors. These have to do with how the mind works. The causes of other types of prostatitis are normally not known. What are the signs or symptoms? Symptoms of this condition depend on the type of prostatitis you have. If your condition is caused by germs: You may feel pain or burning when you pee (urinate). You may pee often and all of a sudden. You may have problems starting to pee. You may have trouble emptying your bladder when you pee. You may have fever or chills. You may feel pain in your muscles, joints, low back, or lower belly. If you have other types of prostatitis: You may pee often or all of a sudden. You may have trouble starting to pee. You may have a weak flow when you pee. You may leak pee after using the bathroom. You may have other problems, such as: Abnormal fluid coming from the penis. Pain in the testicles or penis. Pain between the butt and the testicles. Pain when fluid comes out of the penis during sex. How is this treated? Treatment for this condition depends on the type of prostatitis. Treatment may include: Medicines. These may treat pain or swelling, or they may help relax muscles. Exercises to help you move better or get stronger (physical therapy). Heat therapy. Techniques to help  you control some of the ways that your body works. Exercises to help you relax. Antibiotic medicine, if your condition is caused by germs. Warm water baths (sitz baths) to relax muscles. Follow these instructions at home: Medicines Take over-the-counter and prescription medicines only as told by your doctor. If you were prescribed an antibiotic medicine, take it as told by your doctor. Do not stop using the antibiotic even if you start to feel better. Managing pain and swelling  Take sitz baths as told by your doctor. For a sitz bath, sit in warm water that is deep enough to cover your hips and butt. If told, put heat on the painful area. Do this as often as told by your doctor. Use the heat source that your doctor recommends, such as a moist heat pack or a heating pad. Place a towel between your skin and the heat source. Leave the heat on for 20-30 minutes. Take off the heat if your skin turns bright red. This is very important if you are unable to feel pain, heat, or cold. You may have a greater risk of getting burned. General instructions Do exercises as told by your doctor, if your doctor prescribed them. Keep all follow-up visits as told by your doctor. This is important. Where to find more information National Institute of Diabetes and Digestive and Kidney Diseases: https://www.niddk.nih.gov Contact a doctor if: Your symptoms get worse. You have a fever. Get help right away if: You have chills. You feel light-headed. You feel like you may   faint. You cannot pee. You have blood or clumps of blood (blood clots) in your pee. Summary Prostatitis is swelling of the prostate gland. There are different types of prostatitis. Treatment depends on the type that you have. Take over-the-counter and prescription medicines only as told by your doctor. Get help right away of you have chills, feel light-headed, or feel like you may faint. Also get help right away if you cannot pee or you have  blood or clumps of blood in your pee. This information is not intended to replace advice given to you by your health care provider. Make sure you discuss any questions you have with your health care provider. Document Revised: 10/28/2019 Document Reviewed: 10/28/2019 Elsevier Patient Education  2023 Elsevier Inc.  

## 2022-07-23 NOTE — Progress Notes (Signed)
07/23/2022 3:44 PM   ANASTASIO WOGAN 04-20-1956 [id number]  Referring provider: Kathyrn Drown, MD [address] [place],  [place] [address]  Followup chronic prostatitis and dysuria   HPI: Mr Michel is a 66yo here for followup for chronic prostatitis and dysuria. He continues to have dysuria which is unimproved since last visit.  We he tried multiple antibiotics which the patient has stopped since he cannot tolerate the side effects. IPSS 18 QOL 6. No straining to urinate. Urine stream is fair. No other complaints today   PMH: Past Medical History:  Diagnosis Date   Arthritis    neck and left shoulder   Asthma    Back pain    buldging disc   Complication of anesthesia    difficult to wake up after neck surgery   COPD (chronic obstructive pulmonary disease) (HCC)    Depression    takes Wellbutrin   Diabetes mellitus    takes Metformin bid   Emphysema    GERD (gastroesophageal reflux disease)    takes Omeprazole daily   Hemorrhoids    High cholesterol    takes Pravastatin daily   HTN (hypertension)    takes Lisinopril,Norvasc,and HCTZ daily   IBS (irritable bowel syndrome)    Insomnia    doesn't take any meds    Pneumonia    hx of in 80's and 90's   Prostate cancer ([place])    Sinus headache    sinus drainage    Surgical History: Past Surgical History:  Procedure Laterality Date   ANTERIOR CERVICAL DECOMP/DISCECTOMY FUSION  12/04/2011   Procedure: ANTERIOR CERVICAL DECOMPRESSION/DISCECTOMY FUSION 2 LEVELS;  Surgeon: Hosie Spangle, MD;  Location: [facility];  Service: Neurosurgery;  Laterality: N/A;  Cervical Three-Four, Cervical Four-Five anterior cervial decompression with fusion plating and bonegraft   CARPAL TUNNEL RELEASE  10+yrs   right side   COLONOSCOPY N/A 04/19/2014   Procedure: COLONOSCOPY;  Surgeon: Rogene Houston, MD;  Location: [facility];  Service: Endoscopy;  Laterality: N/A;  1030-moved to 1230 Ann to notify pt   CYSTECTOMY   12-28yr ago   from left side of neck   GOLD SEED IMPLANT N/A 12/06/2020   Procedure: [place]  Surgeon: MCleon Gustin MD;  Location: [facility];  Service: Urology;  Laterality: N/A;   SPACE OAR INSTILLATION N/A 12/06/2020   Procedure: SPACE OAR INSTILLATION;  Surgeon: MCleon Gustin MD;  Location: [facility];  Service: Urology;  Laterality: N/A;    Home Medications:  Allergies as of 07/23/2022       Reactions   Doxycycline Diarrhea   Morphine And Related Other (See Comments)   Insomnia   Sulfa Antibiotics Rash        Medication List        Accurate as of July 23, 2022  3:44 PM. If you have any questions, ask your nurse or doctor.          Accu-Chek Aviva Plus w/Device Kit   Accu-Chek Aviva Soln   Accu-Chek Softclix Lancets lancets   freestyle lancets Use to check blood sugar TID   albuterol 108 (90 Base) MCG/ACT inhaler Commonly known as: VENTOLIN HFA INHALE 2 PUFFS INTO THE LUNGS EVERY (4) HOURS AS NEEDED. SHAKE WELL BEFORE EACH USE.   amLODipine 5 MG tablet Commonly known as: NORVASC TAKE ONE (1) TABLET BY MOUTH EVERY DAY   amoxicillin-clavulanate 875-125 MG tablet Commonly known as: AUGMENTIN Take 1 tablet by mouth every  12 (twelve) hours.   aspirin 81 MG tablet Take 81 mg by mouth daily.   B-D SINGLE USE SWABS REGULAR Pads   cetirizine 10 MG tablet Commonly known as: ZYRTEC TAKE ONE TABLET BY MOUTH AT BEDTIME.   diphenoxylate-atropine 2.5-0.025 MG tablet Commonly known as: LOMOTIL TAKE ONE TABLET BY MOUTH TWICE A DAY AS NEEDED FOR DIARRHEA DUE TO PROSTRATE RADIATION.   doxycycline 100 MG capsule Commonly known as: VIBRAMYCIN Take 1 capsule (100 mg total) by mouth every 12 (twelve) hours.   fluconazole 150 MG tablet Commonly known as: DIFLUCAN Take 1 tablet now and repeat in 1 week   fluticasone-salmeterol 250-50 MCG/ACT Aepb Commonly known as: Advair Diskus Inhale one puff po BID   FREESTYLE LITE test strip Generic drug:  glucose blood USE TO TEST BLOOD GLUCOSE THREE TIMES DAILY   glipiZIDE 10 MG tablet Commonly known as: GLUCOTROL TAKE ONE  TABLET BY MOUTH TWICE A DAY   hydrochlorothiazide 25 MG tablet Commonly known as: HYDRODIURIL TAKE 1 TABLET BY MOUTH DAILY.   insulin aspart 100 UNIT/ML FlexPen Commonly known as: NOVOLOG 8 units with lunch and dinner -may titrate up to 16 units   ketoconazole 2 % cream Commonly known as: NIZORAL Apply 1 application topically 2 (two) times daily.   Lantus SoloStar 100 UNIT/ML Solostar Pen Generic drug: insulin glargine 40 units qhs. May titrate up to 60 units   losartan 50 MG tablet Commonly known as: COZAAR TAKE 1 TABLET BY MOUTH DAILY.   Olopatadine HCl 0.2 % Soln Apply 1 drop to eye at bedtime as needed.   omeprazole 20 MG capsule Commonly known as: PRILOSEC TAKE 1 TABLET BY MOUTH TWICE DAILY FOR GERD.   phenazopyridine 100 MG tablet Commonly known as: Pyridium Take 2 to 3 pills daily as needed.   phenazopyridine 100 MG tablet Commonly known as: PYRIDIUM TAKE 1 TABLET BY MOUTH (3) TIMES DAILY AS NEEDED FOR PAIN.   pravastatin 40 MG tablet Commonly known as: PRAVACHOL Take 1 tablet (40 mg total) by mouth daily.   sildenafil 20 MG tablet Commonly known as: REVATIO TAKE UP TO THREE TABLETS BY MOUTH AS NEEDED   silodosin 8 MG Caps capsule Commonly known as: RAPAFLO Take 1 capsule (8 mg total) by mouth daily with breakfast.   sucralfate 1 g tablet Commonly known as: CARAFATE TAKE ONE (1) TABLET BY MOUTH 3 TIMES DAILY FOR GERD.   Uribel 118 MG Caps Take 1 tablet 2 times per day prn.        Allergies:  Allergies  Allergen Reactions   Doxycycline Diarrhea   Morphine And Related Other (See Comments)    Insomnia    Sulfa Antibiotics Rash    Family History: Family History  Problem Relation Age of Onset   Heart disease Father    Hypertension Father    Heart disease Brother    Anesthesia problems Neg Hx    Hypotension Neg Hx     Malignant hyperthermia Neg Hx    Pseudochol deficiency Neg Hx    Breast cancer Neg Hx    Prostate cancer Neg Hx    Colon cancer Neg Hx    Pancreatic cancer Neg Hx     Social History:  reports that he has been smoking cigarettes. He has a 60.00 pack-year smoking history. He has never used smokeless tobacco. He reports current alcohol use. He reports that he does not use drugs.  ROS: All other review of systems were reviewed and are negative except what is  noted above in HPI  Physical Exam: There were no vitals taken for this visit.  Constitutional:  Alert and oriented, No acute distress. HEENT: [place] AT, moist mucus membranes.  Trachea midline, no masses. Cardiovascular: No clubbing, cyanosis, or edema. Respiratory: Normal respiratory effort, no increased work of breathing. GI: Abdomen is soft, nontender, nondistended, no abdominal masses GU: No CVA tenderness.  Lymph: No cervical or inguinal lymphadenopathy. Skin: No rashes, bruises or suspicious lesions. Neurologic: Grossly intact, no focal deficits, moving all 4 extremities. Psychiatric: Normal mood and affect.  Laboratory Data: Lab Results  Component Value Date   WBC 6.9 03/30/2013   HGB 16.4 03/30/2013   HCT 45.8 03/30/2013   MCV 85.3 03/30/2013   PLT 247 03/30/2013    Lab Results  Component Value Date   CREATININE 1.03 07/16/2021    Lab Results  Component Value Date   PSA 3.68 07/07/2013    No results found for: "TESTOSTERONE"  Lab Results  Component Value Date   HGBA1C 9.2 (H) 07/16/2021    Urinalysis    Component Value Date/Time   APPEARANCEUR Clear 05/28/2022 1651   GLUCOSEU 2+ (A) 05/28/2022 1651   BILIRUBINUR Negative 05/28/2022 1651   PROTEINUR 1+ (A) 05/28/2022 1651   NITRITE Negative 05/28/2022 1651   LEUKOCYTESUR Negative 05/28/2022 1651    Lab Results  Component Value Date   LABMICR See below: 05/28/2022   WBCUA None seen 05/28/2022   LABEPIT None seen 05/28/2022   MUCUS Present  05/28/2022   BACTERIA Few 05/28/2022    Pertinent Imaging:  No results found for this or any previous visit.  No results found for this or any previous visit.  No results found for this or any previous visit.  No results found for this or any previous visit.  No results found for this or any previous visit.  No valid procedures specified. No results found for this or any previous visit.  Results for orders placed during the hospital encounter of 04/21/22  CT RENAL STONE STUDY  Narrative CLINICAL DATA:  Nephrolithiasis.  Dysuria.  EXAM: CT ABDOMEN AND PELVIS WITHOUT CONTRAST  TECHNIQUE: Multidetector CT imaging of the abdomen and pelvis was performed following the standard protocol without IV contrast.  RADIATION DOSE REDUCTION: This exam was performed according to the departmental dose-optimization program which includes automated exposure control, adjustment of the mA and/or kV according to patient size and/or use of iterative reconstruction technique.  COMPARISON:  Overlapping portions CT chest 07/28/2018  FINDINGS: Lower chest: Lingular bulla, image 6 series 4. Mild scarring or atelectasis in the right lower lobe. Descending thoracic aortic atherosclerosis and circumflex coronary artery atherosclerosis.  Hepatobiliary: Multiple gallstones measuring up to 0.7 cm in diameter. No biliary dilatation. Otherwise unremarkable.  Pancreas: Unremarkable  Spleen: Unremarkable  Adrenals/Urinary Tract: Both adrenal glands appear normal.  There are proximally 8 nonobstructive right renal calculi, one of the largest in the right kidney lower pole measures 0.8 cm in long axis.  Two left kidney lower pole calculi are identified, the larger measuring 0.5 cm in short axis.  No hydronephrosis or hydroureter. No ureteral or bladder calculus identified. Wall thickening in the urinary bladder is least partially from nondistention but there could certainly be a component of  cystitis.  Stomach/Bowel: Unremarkable  Vascular/Lymphatic: Atherosclerosis is present, including aortoiliac atherosclerotic disease. No pathologic adenopathy observed.  Reproductive: Prostatomegaly. Fiducials noted along the prostate margins.  Other: No supplemental non-categorized findings.  Musculoskeletal: Mild bridging spurring anteriorly in the sacroiliac joints. Mild bilateral degenerative  hip arthropathy. Lumbar spondylosis and degenerative disc disease causing impingement especially at L4-5.  IMPRESSION: 1. Bilateral nonobstructive renal calculi. No hydronephrosis, hydroureter, or ureteral/bladder calculus. 2. Bladder wall thickening may partially be due to nondistention but superimposed cystitis is not excluded. 3. Prostatomegaly with fiducials in place. 4. Other imaging findings of potential clinical significance: Aortic Atherosclerosis (ICD10-I70.0). Coronary atherosclerosis. Cholelithiasis. Mild bilateral degenerative hip arthropathy. Lumbar impingement especially at L4-5.   Electronically Signed By: Van Clines M.D. On: 04/22/2022 14:52   Assessment & Plan:    1. Chronic prostatitis without hematuria -Augmentin 875 BID for 3 weeks. If his dysuria fails to improve we will proceed with cystoscopy - Urinalysis, Routine w reflex microscopic - Cytology, urine   No follow-ups on file.  Nicolette Bang, MD  [facility] [place]

## 2022-07-24 LAB — URINALYSIS, ROUTINE W REFLEX MICROSCOPIC
Specific Gravity, UA: 1.015 (ref 1.005–1.030)
pH, UA: 5 (ref 5.0–7.5)

## 2022-07-24 LAB — MICROSCOPIC EXAMINATION: Bacteria, UA: NONE SEEN

## 2022-07-25 LAB — CYTOLOGY, URINE

## 2022-07-28 ENCOUNTER — Other Ambulatory Visit: Payer: Self-pay | Admitting: Urology

## 2022-07-28 ENCOUNTER — Other Ambulatory Visit: Payer: Self-pay | Admitting: Family Medicine

## 2022-07-28 ENCOUNTER — Encounter: Payer: Self-pay | Admitting: Urology

## 2022-08-14 ENCOUNTER — Ambulatory Visit: Payer: HMO | Admitting: Family Medicine

## 2022-08-15 ENCOUNTER — Ambulatory Visit (INDEPENDENT_AMBULATORY_CARE_PROVIDER_SITE_OTHER): Payer: HMO | Admitting: Family Medicine

## 2022-08-15 ENCOUNTER — Encounter: Payer: Self-pay | Admitting: Family Medicine

## 2022-08-15 VITALS — BP 130/64 | Wt 212.6 lb

## 2022-08-15 DIAGNOSIS — Z23 Encounter for immunization: Secondary | ICD-10-CM

## 2022-08-15 DIAGNOSIS — J438 Other emphysema: Secondary | ICD-10-CM | POA: Diagnosis not present

## 2022-08-15 DIAGNOSIS — E1169 Type 2 diabetes mellitus with other specified complication: Secondary | ICD-10-CM | POA: Diagnosis not present

## 2022-08-15 DIAGNOSIS — E1165 Type 2 diabetes mellitus with hyperglycemia: Secondary | ICD-10-CM

## 2022-08-15 DIAGNOSIS — I739 Peripheral vascular disease, unspecified: Secondary | ICD-10-CM | POA: Diagnosis not present

## 2022-08-15 DIAGNOSIS — Z0000 Encounter for general adult medical examination without abnormal findings: Secondary | ICD-10-CM

## 2022-08-15 DIAGNOSIS — E785 Hyperlipidemia, unspecified: Secondary | ICD-10-CM

## 2022-08-15 DIAGNOSIS — I10 Essential (primary) hypertension: Secondary | ICD-10-CM | POA: Diagnosis not present

## 2022-08-15 DIAGNOSIS — Z79899 Other long term (current) drug therapy: Secondary | ICD-10-CM

## 2022-08-15 DIAGNOSIS — Z1211 Encounter for screening for malignant neoplasm of colon: Secondary | ICD-10-CM

## 2022-08-15 NOTE — Progress Notes (Addendum)
   Subjective:    Patient ID: Roger Phillips, male    DOB: 03-29-1956, 66 y.o.   MRN: [id number]  Hypertension This is a chronic problem. Risk factors for coronary artery disease include diabetes mellitus, male gender and dyslipidemia. There are no compliance problems.   Diabetes He presents for his follow-up diabetic visit. He has type 2 diabetes mellitus. (Pt states his kidneys have been bothering him since radiation ) There are no hypoglycemic complications. Risk factors for coronary artery disease include hypertension, dyslipidemia and male sex. He does not see a podiatrist.Eye exam current: goes next month.   Annual wellness visit Patient smokes he has been counseled to quit unlikely he will ever do so Patient's diabetes not under good control we did discuss the importance of getting it under better control Cognitive patient does passed mini cog Denies depression Not having any falls Tries to stay active does not do any purposeful exercise Tries to eat relatively healthy Able to take care of himself and do basic ADLs without trouble    Review of Systems     Objective:   Physical Exam General-in no acute distress Eyes-no discharge Lungs-respiratory rate normal, CTA CV-no murmurs,RRR Extremities skin warm dry no edema Neuro grossly normal Behavior normal, alert Diminished pulses in the feet       Assessment & Plan:  1. Need for vaccination Today - Flu Vaccine QUAD High Dose(Fluad)  2. Primary hypertension Blood pressure under decent control continue current measures - Hepatic function panel - Basic metabolic panel - Hemoglobin A1c - Microalbumin/Creatinine Ratio, Urine - Ambulatory referral to Gastroenterology  3. Other emphysema ([place]) Lung cancer screening Patient encouraged to quit smoking unlikely he will do so Does 2 packs/day Uses his inhalers - CT CHEST LUNG CA SCREEN LOW DOSE W/O CM  4. Hyperlipidemia associated with type 2 diabetes mellitus  ([place]) Takes his cholesterol medicine check lab work await results - Hepatic function panel - Basic metabolic panel - Hemoglobin A1c - Microalbumin/Creatinine Ratio, Urine - Ambulatory referral to Gastroenterology  5. Intermittent claudication (HCC) ABI screening - US ARTERIAL ABI (SCREENING LOWER EXTREMITY)  6. High risk medication use See labs - Hepatic function panel - Basic metabolic panel - Hemoglobin A1c - Microalbumin/Creatinine Ratio, Urine  7. Encounter for screening colonoscopy Due for colonoscopy - Ambulatory referral to Gastroenterology Preventative reviewed with patient Importance of quitting smoking discussed CT scanning for lung cancer screening discussed. Follow-up within 5 months Referral for colonoscopy May Shingles vaccine recommended COVID-vaccine recommended unlikely he will do

## 2022-08-18 ENCOUNTER — Other Ambulatory Visit: Payer: Self-pay | Admitting: Urology

## 2022-08-19 ENCOUNTER — Encounter (INDEPENDENT_AMBULATORY_CARE_PROVIDER_SITE_OTHER): Payer: Self-pay | Admitting: *Deleted

## 2022-08-26 ENCOUNTER — Ambulatory Visit (HOSPITAL_COMMUNITY)
Admission: RE | Admit: 2022-08-26 | Discharge: 2022-08-26 | Disposition: A | Discharge status: Home / Self Care | Payer: HMO | Source: Ambulatory Visit | Attending: Family Medicine | Admitting: Family Medicine

## 2022-08-26 DIAGNOSIS — I739 Peripheral vascular disease, unspecified: Secondary | ICD-10-CM | POA: Insufficient documentation

## 2022-09-01 ENCOUNTER — Ambulatory Visit (INDEPENDENT_AMBULATORY_CARE_PROVIDER_SITE_OTHER): Payer: HMO | Admitting: Urology

## 2022-09-01 VITALS — BP 133/78 | HR 80

## 2022-09-01 DIAGNOSIS — N411 Chronic prostatitis: Secondary | ICD-10-CM

## 2022-09-01 DIAGNOSIS — R300 Dysuria: Secondary | ICD-10-CM | POA: Diagnosis not present

## 2022-09-01 MED ORDER — CIPROFLOXACIN HCL 500 MG PO TABS
500.0000 mg | ORAL_TABLET | Freq: Once | ORAL | Status: AC
  Administered 2022-09-01: 500 mg via ORAL

## 2022-09-01 NOTE — Progress Notes (Signed)
   09/01/22  CC: dysuria   HPI: Roger Phillips is a 66yo here for cystoscopy for dysuria Blood pressure 133/78, pulse 80. NED. A&Ox3.   No respiratory distress   Abd soft, NT, ND Normal phallus with bilateral descended testicles  Cystoscopy Procedure Note  Patient identification was confirmed, informed consent was obtained, and patient was prepped using Betadine solution.  Lidocaine jelly was administered per urethral meatus.     Pre-Procedure: - Inspection reveals a normal caliber ureteral meatus.  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 The cystoscope was unable to be navigated past the bulbar urethral stricture    Post-Procedure: - Patient tolerated the procedure well  Assessment/ Plan: We discussed the management of urethral stricture including balloon , DVIU and urethroplasty. After discussing the options the patient elects for balloon dilation. Risks/benefits/alternatives discussed  Nicolette Bang, MD

## 2022-09-01 NOTE — H&P (View-Only) (Signed)
   09/01/22  CC: dysuria   HPI: [name] is a 66yo here for cystoscopy for dysuria Blood pressure 133/78, pulse 80. NED. A&Ox3.   No respiratory distress   Abd soft, NT, ND Normal phallus with bilateral descended testicles  Cystoscopy Procedure Note  Patient identification was confirmed, informed consent was obtained, and patient was prepped using Betadine solution.  Lidocaine jelly was administered per urethral meatus.     Pre-Procedure: - Inspection reveals a normal caliber ureteral meatus.  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 The cystoscope was unable to be navigated past the bulbar urethral stricture    Post-Procedure: - Patient tolerated the procedure well  Assessment/ Plan: We discussed the management of urethral stricture including balloon , DVIU and urethroplasty. After discussing the options the patient elects for balloon dilation. Risks/benefits/alternatives discussed  Nicolette Bang, MD

## 2022-09-01 NOTE — Addendum Note (Signed)
Addended by: Dairl Ponder on: 09/01/2022 11:28 AM   Modules accepted: Orders

## 2022-09-02 ENCOUNTER — Telehealth: Payer: Self-pay | Admitting: Family Medicine

## 2022-09-02 LAB — URINALYSIS, ROUTINE W REFLEX MICROSCOPIC
Bilirubin, UA: NEGATIVE
Nitrite, UA: POSITIVE — AB
RBC, UA: NEGATIVE
Specific Gravity, UA: 1.01 (ref 1.005–1.030)
Urobilinogen, Ur: 2 mg/dL — ABNORMAL HIGH (ref 0.2–1.0)
pH, UA: 5 (ref 5.0–7.5)

## 2022-09-02 LAB — MICROSCOPIC EXAMINATION
Bacteria, UA: NONE SEEN
WBC, UA: NONE SEEN /hpf (ref 0–5)

## 2022-09-02 NOTE — Telephone Encounter (Signed)
He recently was seen by urology the urine dipstick that they did showed a fair amount of protein it is very important for patient to do the blood work and urine test that we ordered.  He needs to do this preferably within the next 2 weeks so we can see the results of this thank you

## 2022-09-02 NOTE — Telephone Encounter (Signed)
Pt wife in for appt today and pt wife informed and verbalized understanding.

## 2022-09-04 ENCOUNTER — Other Ambulatory Visit: Payer: Self-pay | Admitting: Urology

## 2022-09-05 ENCOUNTER — Telehealth: Payer: Self-pay

## 2022-09-05 NOTE — Telephone Encounter (Signed)
Patient's wife is asking when they should expect to hear when his surgery will be scheduled.  Per surgery scheduler, she will try to reach out to them by the end of the day, if not early next week.  Patient's wife voiced understanding.

## 2022-09-08 ENCOUNTER — Encounter: Payer: Self-pay | Admitting: Urology

## 2022-09-08 NOTE — Patient Instructions (Signed)
Urethral Stricture ? ?Urethral stricture is narrowing of the tube (urethra) that carries urine from the bladder out of the body. The urethra can become narrow due to scar tissue from an injury or infection. This can make it difficult to pass urine. ?In women, the urethra opens above the vaginal opening. In men, the urethra opens at the tip of the penis, and the urethra is much longer than it is in women. Because of the length of the male urethra, urethral stricture is much more common in men. This condition is treated with surgery. ?What are the causes? ?In both men and women, common causes of urethral stricture include: ?Urinary tract infection (UTI). ?Sexually transmitted infection (STI). ?Use of a tube placed into the urethra to drain urine from the bladder (urinary catheter). ?Urinary tract surgery. ?In men, common causes of urethral stricture include: ?A severe injury to the pelvis. ?Prostate surgery. ?Injury to the penis. ?In many cases, the cause of urethral stricture is not known. ?What increases the risk? ?You are more likely to develop this condition if you: ?Are male. Men who have had prostate surgery are at risk of developing this condition. ?Use a urinary catheter. ?Have had urinary tract surgery. ?What are the signs or symptoms? ?The main symptom of this condition is difficulty passing urine. This may cause decreased urine flow, dribbling, or spraying of urine. Other symptom of this condition may include: ?Frequent UTIs. ?Blood in the urine. ?Pain when urinating. ?Swelling of the penis in men. ?Inability to pass urine (urinary obstruction). ?How is this diagnosed? ?This condition may be diagnosed based on: ?Your medical history and a physical exam. ?Urine tests to check for infection or bleeding. ?X-rays. ?Ultrasound. ?Retrograde urethrogram. This is a type of test in which dye is injected into the urethra and then an X-ray is taken. ?Urethroscopy. This is when a thin tube with a light and camera on  the end (urethroscope) is used to look at the urethra. ?How is this treated? ?This condition is treated with surgery. The type of surgery that you have depends on the severity of your condition. You may have: ?Urethral dilation. In this procedure, the narrow part of the urethra is stretched open (dilated) with dilating instruments or a small balloon. ?Urethrotomy. In this procedure, a urethroscope is placed into the urethra, and the narrow part of the urethra is cut open with a surgical blade inserted through the urethroscope. ?Open surgery. In this procedure, an incision is made in the urethra, the narrow part is removed, and the urethra is reconstructed. ?Follow these instructions at home: ? ?Take over-the-counter and prescription medicines only as told by your health care provider. ?If you were prescribed an antibiotic medicine, take it as told by your health care provider. Do not stop taking the antibiotic even if you start to feel better. ?Drink enough fluid to keep your urine pale yellow. ?Keep all follow-up visits as told by your health care provider. This is important. ?Contact a health care provider if: ?You have signs of a urinary tract infection, such as: ?Frequent urination or passing small amounts of urine frequently. ?Needing to urinate urgently. ?Pain or burning with urination. ?Urine that smells bad or unusual. ?Cloudy urine. ?Pain in the lower abdomen or back. ?Trouble urinating. ?Blood in the urine. ?Vomiting or being less hungry than normal. ?Diarrhea or abdominal pain. ?Vaginal discharge, if you are male. ?Your symptoms are getting worse instead of better. ?Get help right away if: ?You cannot pass urine. ?You have a fever. ?  You have swelling, bruising, or discoloration of your genital area. This includes the penis, scrotum, and inner thighs for men, and the outer genital organs (vulva) and inner thighs for women. ?You develop swelling in your legs. ?You have difficulty  breathing. ?Summary ?Urethral stricture is narrowing of the tube (urethra) that carries urine from the bladder out of the body. The urethra can become narrow due to scar tissue from an injury or infection. ?This condition can make it difficult to pass urine. ?This condition is treated with surgery. The type of surgery that you have depends on the severity of your condition. ?Contact a health care provider if your symptoms get worse or you have signs of a urinary tract infection. ?This information is not intended to replace advice given to you by your health care provider. Make sure you discuss any questions you have with your health care provider. ?Document Revised: 07/30/2021 Document Reviewed: 07/30/2021 ?[employer] Patient Education ? 2023 [employer] ? ?

## 2022-09-09 ENCOUNTER — Other Ambulatory Visit: Payer: Self-pay | Admitting: Urology

## 2022-09-09 ENCOUNTER — Other Ambulatory Visit: Payer: Self-pay | Admitting: Family Medicine

## 2022-09-09 LAB — HEPATIC FUNCTION PANEL
ALT: 11 IU/L (ref 0–44)
AST: 11 IU/L (ref 0–40)
Albumin: 4.5 g/dL (ref 3.9–4.9)
Alkaline Phosphatase: 68 IU/L (ref 44–121)
Bilirubin Total: 0.8 mg/dL (ref 0.0–1.2)
Bilirubin, Direct: 0.21 mg/dL (ref 0.00–0.40)
Total Protein: 6.9 g/dL (ref 6.0–8.5)

## 2022-09-09 LAB — HEMOGLOBIN A1C
Est. average glucose Bld gHb Est-mCnc: 200 mg/dL
Hgb A1c MFr Bld: 8.6 % — ABNORMAL HIGH (ref 4.8–5.6)

## 2022-09-09 LAB — BASIC METABOLIC PANEL
BUN/Creatinine Ratio: 22 (ref 10–24)
BUN: 25 mg/dL (ref 8–27)
CO2: 25 mmol/L (ref 20–29)
Calcium: 9.5 mg/dL (ref 8.6–10.2)
Chloride: 95 mmol/L — ABNORMAL LOW (ref 96–106)
Creatinine, Ser: 1.12 mg/dL (ref 0.76–1.27)
Glucose: 227 mg/dL — ABNORMAL HIGH (ref 70–99)
Potassium: 3.9 mmol/L (ref 3.5–5.2)
Sodium: 137 mmol/L (ref 134–144)
eGFR: 72 mL/min/{1.73_m2} (ref >59)

## 2022-09-09 LAB — MICROALBUMIN / CREATININE URINE RATIO
Creatinine, Urine: 79.6 mg/dL
Microalb/Creat Ratio: 28 mg/g creat (ref 0–29)
Microalbumin, Urine: 21.9 ug/mL

## 2022-09-11 ENCOUNTER — Telehealth: Payer: Self-pay | Admitting: Family Medicine

## 2022-09-11 ENCOUNTER — Other Ambulatory Visit: Payer: Self-pay | Admitting: *Deleted

## 2022-09-11 DIAGNOSIS — M7989 Other specified soft tissue disorders: Secondary | ICD-10-CM

## 2022-09-11 NOTE — Telephone Encounter (Signed)
FYI- In your office notes from 08/15/22 patient had his AWV visit  that day but no G code was put in and the [facility] scheduler had him down for visit on 09/12/22 so I cancel it when patient called stating had visit already.Please put code in so office/patient will get credit.

## 2022-09-14 NOTE — Telephone Encounter (Signed)
Addendum was made thank you

## 2022-09-17 ENCOUNTER — Encounter: Payer: Self-pay | Admitting: Vascular Surgery

## 2022-09-17 ENCOUNTER — Ambulatory Visit: Payer: PPO

## 2022-09-17 ENCOUNTER — Ambulatory Visit (INDEPENDENT_AMBULATORY_CARE_PROVIDER_SITE_OTHER): Payer: PPO | Admitting: Vascular Surgery

## 2022-09-17 VITALS — BP 109/66 | HR 73 | Temp 98.1°F | Ht 72.0 in | Wt 207.8 lb

## 2022-09-17 DIAGNOSIS — I70211 Atherosclerosis of native arteries of extremities with intermittent claudication, right leg: Secondary | ICD-10-CM | POA: Diagnosis not present

## 2022-09-17 DIAGNOSIS — M7989 Other specified soft tissue disorders: Secondary | ICD-10-CM

## 2022-09-17 NOTE — Patient Instructions (Signed)
Roger Phillips  09/17/2022     '@[email or web address]'$ @   Your procedure is scheduled on 09/22/2022.  Report to [facility] at 11:40 A.M.  Call this number if you have problems the morning of surgery:  [phone]  If you experience any cold or flu symptoms such as cough, fever, chills, shortness of breath, etc. between now and your scheduled surgery, please notify us at the above number.   Remember:  Do not eat or drink after midnight.     Take these medicines the morning of surgery with A SIP OF WATER : Amlodipine Zyrtec and Prilosec   Only take 1/2 of your night time insulin the night before your surgery.    No Diabetic medications the morning of the procedure.     Do not wear jewelry, make-up or nail polish.  Do not wear lotions, powders, or perfumes, or deodorant.  Do not shave 48 hours prior to surgery.  Men may shave face and neck.  Do not bring valuables to the hospital.  [facility] is not responsible for any belongings or valuables.  Contacts, dentures or bridgework may not be worn into surgery.  Leave your suitcase in the car.  After surgery it may be brought to your room.  For patients admitted to the hospital, discharge time will be determined by your treatment team.  Patients discharged the day of surgery will not be allowed to drive home.   Name and phone number of your driver:   Family Special instructions:  N/A  Please read over the following fact sheets that you were given. Care and Recovery After Surgery  Cystoscopy Cystoscopy is a procedure that is used to help diagnose and sometimes treat conditions that affect the lower urinary tract. The lower urinary tract includes the bladder and the urethra. The urethra is the tube that drains urine from the bladder. Cystoscopy is done using a thin, tube-shaped instrument with a light and camera at the end (cystoscope). The cystoscope may be hard or flexible, depending on the goal of the procedure. The cystoscope  is inserted through the urethra, into the bladder. Cystoscopy may be recommended if you have: Urinary tract infections that keep coming back. Blood in the urine (hematuria). An inability to control when you urinate (urinary incontinence) or an overactive bladder. Unusual cells found in a urine sample. A blockage in the urethra, such as a urinary stone. Painful urination. An abnormality in the bladder found during an intravenous pyelogram (IVP) or CT scan. Cystoscopy may also be done to remove a sample of tissue to be examined under a microscope (biopsy). Tell a health care provider about: Any allergies you have. All medicines you are taking, including vitamins, herbs, eye drops, creams, and over-the-counter medicines. Any problems you or family members have had with anesthetic medicines. Any blood disorders you have. Any surgeries you have had. Any medical conditions you have. Whether you are pregnant or may be pregnant. What are the risks? Generally, this is a safe procedure. However, problems may occur, including: Infection. Bleeding. Allergic reactions to medicines. Damage to other structures or organs. What happens before the procedure? Medicines Ask your health care provider about: Changing or stopping your regular medicines. This is especially important if you are taking diabetes medicines or blood thinners. Taking medicines such as aspirin and ibuprofen. These medicines can thin your blood. Do not take these medicines unless your health care provider tells you to take them. Taking over-the-counter medicines, vitamins, herbs, and supplements. Tests You  may have an exam or testing, such as: X-rays of the bladder, urethra, or kidneys. CT scan of the abdomen or pelvis. Urine tests to check for signs of infection. General instructions Follow instructions from your health care provider about eating or drinking restrictions. Ask your health care provider what steps will be taken  to help prevent infection. These steps may include: Washing skin with a germ-killing soap. Taking antibiotic medicine. Plan to have a responsible adult take you home from the hospital or clinic. What happens during the procedure?  You will be given one or more of the following: A medicine to help you relax (sedative). A medicine to numb the area (local anesthetic). The area around the opening of your urethra will be cleaned. The cystoscope will be passed through your urethra into your bladder. Germ-free (sterile) fluid will flow through the cystoscope to fill your bladder. The fluid will stretch your bladder so that your health care provider can clearly examine your bladder walls. Your doctor will look at the urethra and bladder. Your doctor may take a biopsy or remove stones. The cystoscope will be removed, and your bladder will be emptied. The procedure may vary among health care providers and hospitals. What can I expect after the procedure? After the procedure, it is common to have: Some soreness or pain in your abdomen and urethra. Urinary symptoms. These include: Mild pain or burning when you urinate. Pain should stop within a few minutes after you urinate. This may last for up to 1 week. A small amount of blood in your urine for several days. Feeling like you need to urinate but producing only a small amount of urine. Follow these instructions at home: Medicines Take over-the-counter and prescription medicines only as told by your health care provider. If you were prescribed an antibiotic medicine, take it as told by your health care provider. Do not stop taking the antibiotic even if you start to feel better. General instructions Return to your normal activities as told by your health care provider. Ask your health care provider what activities are safe for you. If you were given a sedative during the procedure, it can affect you for several hours. Do not drive or operate  machinery until your health care provider says that it is safe. Watch for any blood in your urine. If the amount of blood in your urine increases, call your health care provider. Follow instructions from your health care provider about eating or drinking restrictions. If a tissue sample was removed for testing (biopsy) during your procedure, it is up to you to get your test results. Ask your health care provider, or the department that is doing the test, when your results will be ready. Drink enough fluid to keep your urine pale yellow. Keep all follow-up visits. This is important. Contact a health care provider if: You have pain that gets worse or does not get better with medicine, especially pain when you urinate. You have trouble urinating. You have more blood in your urine. Get help right away if: You have blood clots in your urine. You have abdominal pain. You have a fever or chills. You are unable to urinate. Summary Cystoscopy is a procedure that is used to help diagnose and sometimes treat conditions that affect the lower urinary tract. Cystoscopy is done using a thin, tube-shaped instrument with a light and camera at the end. After the procedure, it is common to have some soreness or pain in your abdomen and urethra. Watch for  any blood in your urine. If the amount of blood in your urine increases, call your health care provider. If you were prescribed an antibiotic medicine, take it as told by your health care provider. Do not stop taking the antibiotic even if you start to feel better. This information is not intended to replace advice given to you by your health care provider. Make sure you discuss any questions you have with your health care provider. Document Revised: 06/05/2021 Document Reviewed: 05/04/2020 Elsevier Patient Education  [place].  How to Use Chlorhexidine Before Surgery Chlorhexidine gluconate (CHG) is a germ-killing (antiseptic) solution that is used  to clean the skin. It can get rid of the bacteria that normally live on the skin and can keep them away for about 24 hours. To clean your skin with CHG, you may be given: A CHG solution to use in the shower or as part of a sponge bath. A prepackaged cloth that contains CHG. Cleaning your skin with CHG may help lower the risk for infection: While you are staying in the intensive care unit of the hospital. If you have a vascular access, such as a central line, to provide short-term or long-term access to your veins. If you have a catheter to drain urine from your bladder. If you are on a ventilator. A ventilator is a machine that helps you breathe by moving air in and out of your lungs. After surgery. What are the risks? Risks of using CHG include: A skin reaction. Hearing loss, if CHG gets in your ears and you have a perforated eardrum. Eye injury, if CHG gets in your eyes and is not rinsed out. The CHG product catching fire. Make sure that you avoid smoking and flames after applying CHG to your skin. Do not use CHG: If you have a chlorhexidine allergy or have previously reacted to chlorhexidine. On babies younger than 56 months of age. How to use CHG solution Use CHG only as told by your health care provider, and follow the instructions on the label. Use the full amount of CHG as directed. Usually, this is one bottle. During a shower Follow these steps when using CHG solution during a shower (unless your health care provider gives you different instructions): Start the shower. Use your normal soap and shampoo to wash your face and hair. Turn off the shower or move out of the shower stream. Pour the CHG onto a clean washcloth. Do not use any type of brush or rough-edged sponge. Starting at your neck, lather your body down to your toes. Make sure you follow these instructions: If you will be having surgery, pay special attention to the part of your body where you will be having surgery.  Scrub this area for at least 1 minute. Do not use CHG on your head or face. If the solution gets into your ears or eyes, rinse them well with water. Avoid your genital area. Avoid any areas of skin that have broken skin, cuts, or scrapes. Scrub your back and under your arms. Make sure to wash skin folds. Let the lather sit on your skin for 1-2 minutes or as long as told by your health care provider. Thoroughly rinse your entire body in the shower. Make sure that all body creases and crevices are rinsed well. Dry off with a clean towel. Do not put any substances on your body afterward--such as powder, lotion, or perfume--unless you are told to do so by your health care provider. Only use  lotions that are recommended by the manufacturer. Put on clean clothes or pajamas. If it is the night before your surgery, sleep in clean sheets.  During a sponge bath Follow these steps when using CHG solution during a sponge bath (unless your health care provider gives you different instructions): Use your normal soap and shampoo to wash your face and hair. Pour the CHG onto a clean washcloth. Starting at your neck, lather your body down to your toes. Make sure you follow these instructions: If you will be having surgery, pay special attention to the part of your body where you will be having surgery. Scrub this area for at least 1 minute. Do not use CHG on your head or face. If the solution gets into your ears or eyes, rinse them well with water. Avoid your genital area. Avoid any areas of skin that have broken skin, cuts, or scrapes. Scrub your back and under your arms. Make sure to wash skin folds. Let the lather sit on your skin for 1-2 minutes or as long as told by your health care provider. Using a different clean, wet washcloth, thoroughly rinse your entire body. Make sure that all body creases and crevices are rinsed well. Dry off with a clean towel. Do not put any substances on your body  afterward--such as powder, lotion, or perfume--unless you are told to do so by your health care provider. Only use lotions that are recommended by the manufacturer. Put on clean clothes or pajamas. If it is the night before your surgery, sleep in clean sheets. How to use CHG prepackaged cloths Only use CHG cloths as told by your health care provider, and follow the instructions on the label. Use the CHG cloth on clean, dry skin. Do not use the CHG cloth on your head or face unless your health care provider tells you to. When washing with the CHG cloth: Avoid your genital area. Avoid any areas of skin that have broken skin, cuts, or scrapes. Before surgery Follow these steps when using a CHG cloth to clean before surgery (unless your health care provider gives you different instructions): Using the CHG cloth, vigorously scrub the part of your body where you will be having surgery. Scrub using a back-and-forth motion for 3 minutes. The area on your body should be completely wet with CHG when you are done scrubbing. Do not rinse. Discard the cloth and let the area air-dry. Do not put any substances on the area afterward, such as powder, lotion, or perfume. Put on clean clothes or pajamas. If it is the night before your surgery, sleep in clean sheets.  For general bathing Follow these steps when using CHG cloths for general bathing (unless your health care provider gives you different instructions). Use a separate CHG cloth for each area of your body. Make sure you wash between any folds of skin and between your fingers and toes. Wash your body in the following order, switching to a new cloth after each step: The front of your neck, shoulders, and chest. Both of your arms, under your arms, and your hands. Your stomach and groin area, avoiding the genitals. Your right leg and foot. Your left leg and foot. The back of your neck, your back, and your buttocks. Do not rinse. Discard the cloth and  let the area air-dry. Do not put any substances on your body afterward--such as powder, lotion, or perfume--unless you are told to do so by your health care provider. Only use lotions that  are recommended by the manufacturer. Put on clean clothes or pajamas. Contact a health care provider if: Your skin gets irritated after scrubbing. You have questions about using your solution or cloth. You swallow any chlorhexidine. Call your local poison control center (1-218-352-1243 in the [place] Get help right away if: Your eyes itch badly, or they become very red or swollen. Your skin itches badly and is red or swollen. Your hearing changes. You have trouble seeing. You have swelling or tingling in your mouth or throat. You have trouble breathing. These symptoms may represent a serious problem that is an emergency. Do not wait to see if the symptoms will go away. Get medical help right away. Call your local emergency services (911 in the [place] Do not drive yourself to the hospital. Summary Chlorhexidine gluconate (CHG) is a germ-killing (antiseptic) solution that is used to clean the skin. Cleaning your skin with CHG may help to lower your risk for infection. You may be given CHG to use for bathing. It may be in a bottle or in a prepackaged cloth to use on your skin. Carefully follow your health care provider's instructions and the instructions on the product label. Do not use CHG if you have a chlorhexidine allergy. Contact your health care provider if your skin gets irritated after scrubbing. This information is not intended to replace advice given to you by your health care provider. Make sure you discuss any questions you have with your health care provider. Document Revised: 01/20/2022 Document Reviewed: 12/03/2020 Elsevier Patient Education  [place] Anesthesia, Adult General anesthesia is the use of medicine to make you fall asleep (unconscious) for a medical procedure.  General anesthesia must be used for certain procedures. It is often recommended for surgery or procedures that: Last a long time. Require you to be still or in an unusual position. Are major and can cause blood loss. Affect your breathing. The medicines used for general anesthesia are called general anesthetics. During general anesthesia, these medicines are given along with medicines that: Prevent pain. Control your blood pressure. Relax your muscles. Prevent nausea and vomiting after the procedure. Tell a health care provider about: Any allergies you have. All medicines you are taking, including vitamins, herbs, eye drops, creams, and over-the-counter medicines. Your history of any: Medical conditions you have, including: High blood pressure. Bleeding problems. Diabetes. Heart or lung conditions, such as: Heart failure. Sleep apnea. Asthma. Chronic obstructive pulmonary disease (COPD). Current or recent illnesses, such as: Upper respiratory, chest, or ear infections. Cough or fever. Tobacco or drug use, including marijuana or alcohol use. Depression or anxiety. Surgeries and types of anesthetics you have had. Problems you or family members have had with anesthetic medicines. Whether you are pregnant or may be pregnant. Whether you have any chipped or loose teeth, dentures, caps, bridgework, or issues with your mouth, swallowing, or choking. What are the risks? Your health care provider will talk with you about risks. These may include: Allergic reaction to the medicines. Lung and heart problems. Inhaling food or liquid from the stomach into the lungs (aspiration). Nerve injury. Injury to the lips, mouth, teeth, or gums. Stroke. Waking up during your procedure and being unable to move. This is rare. These problems are more likely to develop if you are having a major surgery or if you have an advanced or serious medical condition. You can prevent some of these complications  by answering all of your health care provider's questions thoroughly and by following all  instructions before your procedure. General anesthesia can cause side effects, including: Nausea or vomiting. A sore throat or hoarseness from the breathing tube. Wheezing or coughing. Shaking chills or feeling cold. Body aches. Sleepiness. Confusion, agitation (delirium), or anxiety. What happens before the procedure? When to stop eating and drinking Follow instructions from your health care provider about what you may eat and drink before your procedure. If you do not follow your health care provider's instructions, your procedure may be delayed or canceled. Medicines Ask your health care provider about: Changing or stopping your regular medicines. These include any diabetes medicines or blood thinners you take. Taking medicines such as aspirin and ibuprofen. These medicines can thin your blood. Do not take them unless your health care provider tells you to. Taking over-the-counter medicines, vitamins, herbs, and supplements. General instructions Do not use any products that contain nicotine or tobacco for at least 4 weeks before the procedure. These products include cigarettes, chewing tobacco, and vaping devices, such as e-cigarettes. If you need help quitting, ask your health care provider. If you brush your teeth on the morning of the procedure, make sure to spit out all of the water and toothpaste. If told by your health care provider, bring your sleep apnea device with you to surgery (if applicable). If you will be going home right after the procedure, plan to have a responsible adult: Take you home from the hospital or clinic. You will not be allowed to drive. Care for you for the time you are told. What happens during the procedure?  An IV will be inserted into one of your veins. You will be given one or more of the following through a face mask or IV: A sedative. This helps you  relax. Anesthesia. This will: Numb certain areas of your body. Make you fall asleep for surgery. After you are unconscious, a breathing tube may be inserted down your throat to help you breathe. This will be removed before you wake up. An anesthesia provider, such as an anesthesiologist, will stay with you throughout your procedure. The anesthesia provider will: Keep you comfortable and safe by continuing to give you medicines and adjusting the amount of medicine that you get. Monitor your blood pressure, heart rate, and oxygen levels to make sure that the anesthetics do not cause any problems. The procedure may vary among health care providers and hospitals. What happens after the procedure? Your blood pressure, temperature, heart rate, breathing rate, and blood oxygen level will be monitored until you leave the hospital or clinic. You will wake up in a recovery area. You may wake up slowly. You may be given medicine to help you with pain, nausea, or any other side effects from the anesthesia. Summary General anesthesia is the use of medicine to make you fall asleep (unconscious) for a medical procedure. Follow your health care provider's instructions about when to stop eating, drinking, or taking certain medicines before your procedure. Plan to have a responsible adult take you home from the hospital or clinic. This information is not intended to replace advice given to you by your health care provider. Make sure you discuss any questions you have with your health care provider. Document Revised: 12/19/2021 Document Reviewed: 12/19/2021 Elsevier Patient Education  [place].

## 2022-09-17 NOTE — Progress Notes (Signed)
[facility] [place]  Patient name: Roger Phillips MRN: [id number] DOB: 12-Aug-1956 Sex: male  REASON FOR CONSULT: Evaluation lower extremity discomfort  HPI: Roger Phillips is a 66 y.o. male, who is today for evaluation of lower extremity arterial insufficiency.  He is a very pleasant 66 year old gentleman.  He reports pain and aching in his right anterior compartment with prolonged walking.  He reports that he can mow for approximately 100 to 150 yards before he senses this.  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  He has no tissue loss.  He does not have any similar symptoms in the left leg.  He does report a history of sciatic discomfort in his left posterior buttock extending down the posterior thigh.  He also reports cramping and pain in his legs at night.  Past Medical History:  Diagnosis Date   Arthritis    neck and left shoulder   Asthma    Back pain    buldging disc   Complication of anesthesia    difficult to wake up after neck surgery   COPD (chronic obstructive pulmonary disease) (HCC)    Depression    takes Wellbutrin   Diabetes mellitus    takes Metformin bid   Emphysema    GERD (gastroesophageal reflux disease)    takes Omeprazole daily   Hemorrhoids    High cholesterol    takes Pravastatin daily   HTN (hypertension)    takes Lisinopril,Norvasc,and HCTZ daily   IBS (irritable bowel syndrome)    Insomnia    doesn't take any meds    Pneumonia    hx of in 80's and 90's   Prostate cancer (HCC)    Sinus headache    sinus drainage    Family History  Problem Relation Age of Onset   Heart disease Father    Hypertension Father    Heart disease Brother    Anesthesia problems Neg Hx    Hypotension Neg Hx    Malignant hyperthermia Neg Hx    Pseudochol deficiency Neg Hx    Breast cancer Neg Hx    Prostate cancer  Neg Hx    Colon cancer Neg Hx    Pancreatic cancer Neg Hx     SOCIAL HISTORY: Social History   Socioeconomic History   Marital status: Married    Spouse name: Not on file   Number of children: 2   Years of education: Not on file   Highest education level: Not on file  Occupational History   Occupation: retired  Tobacco Use   Smoking status: Every Day    Packs/day: 1.50    Years: 40.00    Total pack years: 60.00    Types: Cigarettes   Smokeless tobacco: Never  Vaping Use   Vaping Use: Never used  Substance and Sexual Activity   Alcohol use: Yes   Drug use: No   Sexual activity: Yes  Other Topics Concern   Not on file  Social History Narrative   Not on file   Social Determinants of Health   Financial Resource Strain: Not on file  Food Insecurity: Not on file  Transportation Needs: Not on file  Physical Activity: Not on file  Stress: Not on file  Social Connections: Not on file  Intimate Partner Violence: Not on file    Allergies  Allergen Reactions  Doxycycline Diarrhea   Morphine And Related Other (See Comments)    Insomnia    Sulfa Antibiotics Rash    Current Outpatient Medications  Medication Sig Dispense Refill   ACCU-CHEK SOFTCLIX LANCETS lancets      albuterol (VENTOLIN HFA) 108 (90 Base) MCG/ACT inhaler INHALE 2 PUFFS INTO THE LUNGS EVERY (4) HOURS AS NEEDED. SHAKE WELL BEFORE EACH USE. 18 g 0   Alcohol Swabs (B-D SINGLE USE SWABS REGULAR) PADS      amLODipine (NORVASC) 5 MG tablet TAKE ONE (1) TABLET BY MOUTH EVERY DAY 90 tablet 1   Blood Glucose Calibration (ACCU-CHEK AVIVA) SOLN      Blood Glucose Monitoring Suppl (ACCU-CHEK AVIVA PLUS) W/DEVICE KIT      cetirizine (ZYRTEC) 10 MG tablet TAKE ONE TABLET BY MOUTH AT BEDTIME. 30 tablet 12   diphenoxylate-atropine (LOMOTIL) 2.5-0.025 MG tablet TAKE ONE TABLET BY MOUTH TWICE A DAY AS NEEDED FOR DIARRHEA DUE TO PROSTRATE RADIATION. 60 tablet 0   fluticasone-salmeterol (ADVAIR DISKUS) 250-50 MCG/ACT  AEPB Inhale one puff po BID 60 each 5   FREESTYLE LITE test strip USE TO TEST BLOOD GLUCOSE THREE TIMES DAILY 100 each 5   glipiZIDE (GLUCOTROL) 10 MG tablet TAKE ONE  TABLET BY MOUTH TWICE A DAY 270 tablet 1   hydrochlorothiazide (HYDRODIURIL) 25 MG tablet TAKE 1 TABLET BY MOUTH DAILY. 90 tablet 0   insulin aspart (NOVOLOG) 100 UNIT/ML FlexPen 8 units with lunch and dinner -may titrate up to 16 units 15 mL 5   insulin glargine (LANTUS SOLOSTAR) 100 UNIT/ML Solostar Pen 40 units qhs. May titrate up to 60 units 3 mL 5   Lancets (FREESTYLE) lancets Use to check blood sugar TID 300 each 3   losartan (COZAAR) 50 MG tablet TAKE 1 TABLET BY MOUTH DAILY. 90 tablet 0   Meth-Hyo-M Bl-Na Phos-Ph Sal (URIBEL) 118 MG CAPS Take 1 tablet 2 times per day prn. 30 capsule 0   omeprazole (PRILOSEC) 20 MG capsule TAKE 1 TABLET BY MOUTH TWICE DAILY FOR GERD. 180 capsule 0   omeprazole (PRILOSEC) 20 MG capsule Take 1 capsule by mouth daily.     phenazopyridine (PYRIDIUM) 100 MG tablet Take 2 to 3 pills daily as needed. 15 tablet 0   pravastatin (PRAVACHOL) 40 MG tablet Take 1 tablet (40 mg total) by mouth daily. 90 tablet 3   sildenafil (REVATIO) 20 MG tablet TAKE UP TO THREE TABLETS BY MOUTH AS NEEDED 50 tablet 5   sucralfate (CARAFATE) 1 g tablet TAKE ONE (1) TABLET BY MOUTH 3 TIMES DAILY FOR GERD. 90 tablet 1   phenazopyridine (PYRIDIUM) 100 MG tablet TAKE 1 TABLET BY MOUTH (3) TIMES DAILY AS NEEDED FOR PAIN. (Patient not taking: Reported on 09/17/2022) 30 tablet 0   phenazopyridine (PYRIDIUM) 100 MG tablet TAKE 1 TABLET BY MOUTH (3) TIMES DAILY AS NEEDED FOR PAIN. (Patient not taking: Reported on 09/17/2022) 30 tablet 0   phenazopyridine (PYRIDIUM) 100 MG tablet TAKE 1 TABLET BY MOUTH (3) TIMES DAILY AS NEEDED FOR PAIN. (Patient not taking: Reported on 09/17/2022) 30 tablet 0   phenazopyridine (PYRIDIUM) 100 MG tablet TAKE 1 TABLET BY MOUTH (3) TIMES DAILY AS NEEDED FOR PAIN. (Patient not taking: Reported on  09/17/2022) 30 tablet 0   No current facility-administered medications for this visit.    REVIEW OF SYSTEMS:  _0  denotes positive finding, _1  denotes negative finding Cardiac  Comments:  Chest pain or chest pressure:    Shortness of breath upon exertion:  Short of breath when lying flat:    Irregular heart rhythm:        Vascular    Pain in calf, thigh, or hip brought on by ambulation:    Pain in feet at night that wakes you up from your sleep:     Blood clot in your veins:    Leg swelling:         Pulmonary    Oxygen at home:    Productive cough:     Wheezing:         Neurologic    Sudden weakness in arms or legs:     Sudden numbness in arms or legs:     Sudden onset of difficulty speaking or slurred speech:    Temporary loss of vision in one eye:     Problems with dizziness:         Gastrointestinal    Blood in stool:     Vomited blood:         Genitourinary    Burning when urinating:     Blood in urine:        Psychiatric    Major depression:         Hematologic    Bleeding problems:    Problems with blood clotting too easily:        Skin    Rashes or ulcers:        Constitutional    Fever or chills:      PHYSICAL EXAM: Vitals:   09/17/22 1451  BP: 109/66  Pulse: 73  Temp: 98.1 F (36.7 C)  SpO2: 98%  Weight: 207 lb 12.8 oz (94.3 kg)  Height: 6' (1.829 m)    GENERAL: The patient is a well-nourished male, in no acute distress. The vital signs are documented above. CARDIOVASCULAR: 2+ femoral pulses bilaterally.  1+ popliteal pulses bilaterally and 1-2+ dorsalis pedis pulses bilaterally PULMONARY: There is good air exchange  MUSCULOSKELETAL: There are no major deformities or cyanosis. NEUROLOGIC: No focal weakness or paresthesias are detected. SKIN: There are no ulcers or rashes noted. PSYCHIATRIC: The patient has a normal affect.  DATA:  [facility]invasive studies [facility] reveal ankle arm index of 0.87 on the right and 1.1 on  the left  MEDICAL ISSUES: From a symptom standpoint, he does appear to have intermittent claudication in his right calf.  This difficult to determine his level of occlusive disease with palpable femoral, popliteal and dorsalis pedis pulse.  I did explain that this is not limb threatening.  He is moderately limited by his claudication symptoms.  I have recommended continued observation only.  I did explain that if this becomes a more limiting to him that the neck step would be arteriography and possible endovascular treatment.  I also explained that his leg cramping would not be improved with peripheral intervention.  He was reassured with this discussion and will see [place] again on an as-needed basis   Rosetta Posner, MD [facility] [phone] Pager [phone]  Note: Portions of this report may have been transcribed using voice recognition software.  Every effort has been made to ensure accuracy; however, inadvertent computerized transcription errors may still be present.

## 2022-09-18 ENCOUNTER — Encounter (HOSPITAL_COMMUNITY)
Admission: RE | Admit: 2022-09-18 | Discharge: 2022-09-18 | Disposition: A | Discharge status: Home / Self Care | Payer: PPO | Source: Ambulatory Visit | Attending: Urology | Admitting: Urology

## 2022-09-18 ENCOUNTER — Other Ambulatory Visit: Payer: Self-pay

## 2022-09-18 ENCOUNTER — Other Ambulatory Visit: Payer: Self-pay | Admitting: Family Medicine

## 2022-09-18 VITALS — BP 125/62 | HR 69 | Temp 98.1°F | Resp 18 | Ht 72.0 in | Wt 207.8 lb

## 2022-09-18 DIAGNOSIS — E1169 Type 2 diabetes mellitus with other specified complication: Secondary | ICD-10-CM | POA: Insufficient documentation

## 2022-09-18 DIAGNOSIS — E785 Hyperlipidemia, unspecified: Secondary | ICD-10-CM | POA: Insufficient documentation

## 2022-09-18 DIAGNOSIS — Z01818 Encounter for other preprocedural examination: Secondary | ICD-10-CM | POA: Diagnosis present

## 2022-09-18 MED ORDER — DIPHENOXYLATE-ATROPINE 2.5-0.025 MG PO TABS: ORAL_TABLET | ORAL | 0 refills | Status: DC

## 2022-09-20 ENCOUNTER — Other Ambulatory Visit: Payer: Self-pay | Admitting: Family Medicine

## 2022-09-22 ENCOUNTER — Ambulatory Visit (HOSPITAL_COMMUNITY): Payer: PPO | Admitting: Certified Registered"

## 2022-09-22 ENCOUNTER — Ambulatory Visit (HOSPITAL_COMMUNITY)
Admission: RE | Admit: 2022-09-22 | Discharge: 2022-09-22 | Disposition: A | Discharge status: Home / Self Care | Payer: PPO | Attending: Urology | Admitting: Urology

## 2022-09-22 ENCOUNTER — Ambulatory Visit (HOSPITAL_BASED_OUTPATIENT_CLINIC_OR_DEPARTMENT_OTHER): Payer: PPO | Admitting: Certified Registered"

## 2022-09-22 ENCOUNTER — Encounter (HOSPITAL_COMMUNITY)
Admission: RE | Disposition: A | Discharge status: Home / Self Care | Payer: Self-pay | Source: Home / Self Care | Attending: Urology

## 2022-09-22 ENCOUNTER — Encounter (HOSPITAL_COMMUNITY): Payer: Self-pay | Admitting: Urology

## 2022-09-22 ENCOUNTER — Other Ambulatory Visit: Payer: Self-pay

## 2022-09-22 DIAGNOSIS — Z794 Long term (current) use of insulin: Secondary | ICD-10-CM

## 2022-09-22 DIAGNOSIS — N35912 Unspecified bulbous urethral stricture, male: Secondary | ICD-10-CM | POA: Diagnosis not present

## 2022-09-22 DIAGNOSIS — I10 Essential (primary) hypertension: Secondary | ICD-10-CM | POA: Diagnosis not present

## 2022-09-22 DIAGNOSIS — E119 Type 2 diabetes mellitus without complications: Secondary | ICD-10-CM | POA: Insufficient documentation

## 2022-09-22 DIAGNOSIS — I2510 Atherosclerotic heart disease of native coronary artery without angina pectoris: Secondary | ICD-10-CM | POA: Diagnosis not present

## 2022-09-22 DIAGNOSIS — F17210 Nicotine dependence, cigarettes, uncomplicated: Secondary | ICD-10-CM | POA: Diagnosis not present

## 2022-09-22 DIAGNOSIS — J449 Chronic obstructive pulmonary disease, unspecified: Secondary | ICD-10-CM

## 2022-09-22 DIAGNOSIS — N35919 Unspecified urethral stricture, male, unspecified site: Secondary | ICD-10-CM

## 2022-09-22 HISTORY — PX: CYSTOSCOPY WITH URETHRAL DILATATION: SHX5125

## 2022-09-22 LAB — GLUCOSE, CAPILLARY: Glucose-Capillary: 221 mg/dL — ABNORMAL HIGH (ref 70–99)

## 2022-09-22 SURGERY — CYSTOSCOPY, WITH URETHRAL DILATION
Anesthesia: General | Site: Urethra

## 2022-09-22 MED ORDER — MIDAZOLAM HCL 5 MG/5ML IJ SOLN
INTRAMUSCULAR | Status: DC | PRN
  Administered 2022-09-22: 2 mg via INTRAVENOUS

## 2022-09-22 MED ORDER — ONDANSETRON HCL 4 MG/2ML IJ SOLN
INTRAMUSCULAR | Status: DC | PRN
  Administered 2022-09-22: 4 mg via INTRAVENOUS

## 2022-09-22 MED ORDER — WATER FOR IRRIGATION, STERILE IR SOLN
Status: DC | PRN
  Administered 2022-09-22: 1000 mL

## 2022-09-22 MED ORDER — LIDOCAINE HCL (PF) 2 % IJ SOLN
INTRAMUSCULAR | Status: AC
  Filled 2022-09-22: qty 5

## 2022-09-22 MED ORDER — PROPOFOL 10 MG/ML IV BOLUS
INTRAVENOUS | Status: AC
  Filled 2022-09-22: qty 20

## 2022-09-22 MED ORDER — OXYCODONE HCL 5 MG/5ML PO SOLN: 5.0000 mg | Freq: Once | ORAL | Status: AC | PRN

## 2022-09-22 MED ORDER — FENTANYL CITRATE PF 50 MCG/ML IJ SOSY
25.0000 ug | PREFILLED_SYRINGE | INTRAMUSCULAR | Status: DC | PRN
  Administered 2022-09-22: 50 ug via INTRAVENOUS
  Filled 2022-09-22 (×2): qty 1

## 2022-09-22 MED ORDER — ONDANSETRON HCL 4 MG/2ML IJ SOLN
INTRAMUSCULAR | Status: AC
  Filled 2022-09-22: qty 2

## 2022-09-22 MED ORDER — CEFAZOLIN SODIUM-DEXTROSE 2-4 GM/100ML-% IV SOLN
INTRAVENOUS | Status: AC
  Filled 2022-09-22: qty 100

## 2022-09-22 MED ORDER — EPHEDRINE SULFATE-NACL 50-0.9 MG/10ML-% IV SOSY
PREFILLED_SYRINGE | INTRAVENOUS | Status: DC | PRN
  Administered 2022-09-22 (×2): 5 mg via INTRAVENOUS

## 2022-09-22 MED ORDER — FENTANYL CITRATE (PF) 100 MCG/2ML IJ SOLN
INTRAMUSCULAR | Status: DC | PRN
  Administered 2022-09-22 (×3): 50 ug via INTRAVENOUS

## 2022-09-22 MED ORDER — HYDROCODONE-ACETAMINOPHEN 5-325 MG PO TABS: 1.0000 | ORAL_TABLET | Freq: Four times a day (QID) | ORAL | 0 refills | Status: DC | PRN

## 2022-09-22 MED ORDER — MIDAZOLAM HCL 2 MG/2ML IJ SOLN
INTRAMUSCULAR | Status: AC
  Filled 2022-09-22: qty 2

## 2022-09-22 MED ORDER — ONDANSETRON HCL 4 MG/2ML IJ SOLN: 4.0000 mg | Freq: Once | INTRAMUSCULAR | Status: DC | PRN

## 2022-09-22 MED ORDER — CEFAZOLIN SODIUM-DEXTROSE 2-3 GM-%(50ML) IV SOLR
INTRAVENOUS | Status: DC | PRN
  Administered 2022-09-22: 2 g via INTRAVENOUS

## 2022-09-22 MED ORDER — OXYCODONE HCL 5 MG PO TABS
5.0000 mg | ORAL_TABLET | Freq: Once | ORAL | Status: AC | PRN
  Administered 2022-09-22: 5 mg via ORAL
  Filled 2022-09-22: qty 1

## 2022-09-22 MED ORDER — FENTANYL CITRATE (PF) 100 MCG/2ML IJ SOLN
INTRAMUSCULAR | Status: AC
  Filled 2022-09-22: qty 2

## 2022-09-22 MED ORDER — LIDOCAINE 2% (20 MG/ML) 5 ML SYRINGE
INTRAMUSCULAR | Status: DC | PRN
  Administered 2022-09-22: 100 mg via INTRAVENOUS

## 2022-09-22 MED ORDER — LACTATED RINGERS IV SOLN: INTRAVENOUS | Status: DC | PRN

## 2022-09-22 MED ORDER — PROPOFOL 10 MG/ML IV BOLUS
INTRAVENOUS | Status: DC | PRN
  Administered 2022-09-22: 30 mg via INTRAVENOUS
  Administered 2022-09-22: 150 mg via INTRAVENOUS
  Administered 2022-09-22: 20 mg via INTRAVENOUS

## 2022-09-22 SURGICAL SUPPLY — 23 items
BAG DRAIN URO TABLE W/ADPT NS (BAG) ×1 IMPLANT
BAG HAMPER (MISCELLANEOUS) ×1 IMPLANT
BAG URINE DRAIN 2000ML AR STRL (UROLOGICAL SUPPLIES) ×1 IMPLANT
BALLN NEPHROSTOMY (BALLOONS) ×1
BALLN OPTILUME DCB 24X5X75 (BALLOONS) ×1
BALLOON NEPHROSTOMY (BALLOONS) ×1 IMPLANT
BALLOON OPTILUME DCB 24X5X75 (BALLOONS) IMPLANT
CATH FOLEY 2WAY SLVR  5CC 20FR (CATHETERS) ×1
CATH FOLEY 2WAY SLVR 5CC 20FR (CATHETERS) IMPLANT
CLOTH BEACON ORANGE TIMEOUT ST (SAFETY) ×1 IMPLANT
GLOVE BIO SURGEON STRL SZ8 (GLOVE) ×1 IMPLANT
GLOVE BIOGEL PI IND STRL 7.0 (GLOVE) ×2 IMPLANT
GLOVE SURG SS PI 7.0 STRL IVOR (GLOVE) IMPLANT
GOWN STRL REUS W/TWL LRG LVL3 (GOWN DISPOSABLE) ×1 IMPLANT
GOWN STRL REUS W/TWL XL LVL3 (GOWN DISPOSABLE) ×1 IMPLANT
GUIDEWIRE STR DUAL SENSOR (WIRE) ×1 IMPLANT
KIT TURNOVER CYSTO (KITS) ×1 IMPLANT
MANIFOLD NEPTUNE II (INSTRUMENTS) ×1 IMPLANT
PACK CYSTO (CUSTOM PROCEDURE TRAY) ×1 IMPLANT
PAD ARMBOARD 7.5X6 YLW CONV (MISCELLANEOUS) ×1 IMPLANT
TOWEL NATURAL 4PK STERILE (DISPOSABLE) ×1 IMPLANT
WATER STERILE IRR 3000ML UROMA (IV SOLUTION) ×1 IMPLANT
WATER STERILE IRR 500ML POUR (IV SOLUTION) ×1 IMPLANT

## 2022-09-22 NOTE — Op Note (Signed)
Preoperative diagnosis: bulbar urethral stricture   Postoperative diagnosis: Same   Procedure: 1. Cystoscopy 2. Urethral dilation 3. Optilume treatment of urethral stricture   Attending: Nicolette Bang   Anesthesia: General   History of blood loss: Minimal   Antibiotics: Ancef   Specimens: none   Drains: 20 french foley   Findings: dense bulbar urethral stricture, 1cm segment.  No masses/lesions in the bladder.   Indications: Patient is a 66 year old male with a history of urethral stricture who developed a weak urinary stream and was found to have a recurrent bulbar urethral stricture. After discussing treatment options the patient wishes to proceed with cystoscopy with urethral dilation and optilume treatment.   Procedure in detail: Prior to procedure consent was obtained.  Patient was brought to the operating room and a brief timeout was done to ensure correct patient, correct procedure, correct site.  General anesthesia was administered and patient was placed in dorsal lithotomy position.  His genitalia was then prepped and draped in usual sterile fashion. We then passed a 47 French cystoscope up to the bulbar urethra where we encountered a dense stricture. . We were able to pass a wire across the stricture and into the bladder. We then advanced a 30 french urethral dilation balloon across the stricture under direct vision. We then inflated the balloon to 16cm of water and held the pressure for 3 minutes. We then deflated the balloon and advanced the Optilume catheter across the stricture. We then inflated the balloon under direct vision to 10cm of water. The pressure was held for 7 minutes. We then deflated the balloon and removed the balloon and the wire. We then placed a 20 french foley and the bladder was drained.This then concluded the procedure which was well tolerated by the patient.   Complications: None   Condition: Stable, extubated, transferred to PACU.   Plan: Patient  is to be discharged home and followup in 3 days for a voiding trial.

## 2022-09-22 NOTE — Interval H&P Note (Signed)
History and Physical Interval Note:  09/22/2022 10:36 AM  Roger Phillips  has presented today for surgery, with the diagnosis of urethral stricture.  The various methods of treatment have been discussed with the patient and family. After consideration of risks, benefits and other options for treatment, the patient has consented to  Procedure(s): CYSTOSCOPY WITH URETHRAL DILATATION-optilume (N/A) as a surgical intervention.  The patient's history has been reviewed, patient examined, no change in status, stable for surgery.  I have reviewed the patient's chart and labs.  Questions were answered to the patient's satisfaction.     Nicolette Bang

## 2022-09-22 NOTE — Transfer of Care (Signed)
Immediate Anesthesia Transfer of Care Note  Patient: Roger Phillips  Procedure(s) Performed: CYSTOSCOPY WITH URETHRAL DILATATION-optilume (Urethra)  Patient Location: PACU  Anesthesia Type:General  Level of Consciousness: drowsy and patient cooperative  Airway & Oxygen Therapy: Patient Spontanous Breathing  Post-op Assessment: Report given to RN and Post -op Vital signs reviewed and stable  Post vital signs: Reviewed and stable  Last Vitals:  Vitals Value Taken Time  BP 117/87 09/22/22 1146  Temp    Pulse 86 09/22/22 1151  Resp 19 09/22/22 1151  SpO2 90 % 09/22/22 1151  Vitals shown include unvalidated device data.  Last Pain:  Vitals:   09/22/22 0955  TempSrc: Oral  PainSc: 0-No pain      Patients Stated Pain Goal: 8 (15/61/53 [id number])  Complications: No notable events documented.

## 2022-09-22 NOTE — Anesthesia Preprocedure Evaluation (Signed)
Anesthesia Evaluation  Patient identified by MRN, date of birth, ID band Patient awake    Reviewed: Allergy & Precautions, H&P , NPO status , Patient's Chart, lab work & pertinent test results, reviewed documented beta blocker date and time   History of Anesthesia Complications (+) history of anesthetic complications  Airway Mallampati: II  TM Distance: >3 FB Neck ROM: full    Dental no notable dental hx.    Pulmonary neg pulmonary ROS, asthma , pneumonia, COPD, Current Smoker   Pulmonary exam normal breath sounds clear to auscultation       Cardiovascular Exercise Tolerance: Good hypertension, + CAD  negative cardio ROS  Rhythm:regular Rate:Normal     Neuro/Psych  Headaches PSYCHIATRIC DISORDERS  Depression    negative neurological ROS  negative psych ROS   GI/Hepatic negative GI ROS, Neg liver ROS,GERD  ,,  Endo/Other  negative endocrine ROSdiabetes    Renal/GU negative Renal ROS  negative genitourinary   Musculoskeletal   Abdominal   Peds  Hematology negative hematology ROS (+)   Anesthesia Other Findings   Reproductive/Obstetrics negative OB ROS                             Anesthesia Physical Anesthesia Plan  ASA: 3  Anesthesia Plan: General and General LMA   Post-op Pain Management:    Induction:   PONV Risk Score and Plan: Ondansetron  Airway Management Planned:   Additional Equipment:   Intra-op Plan:   Post-operative Plan:   Informed Consent: I have reviewed the patients History and Physical, chart, labs and discussed the procedure including the risks, benefits and alternatives for the proposed anesthesia with the patient or authorized representative who has indicated his/her understanding and acceptance.     Dental Advisory Given  Plan Discussed with: CRNA  Anesthesia Plan Comments:        Anesthesia Quick Evaluation

## 2022-09-22 NOTE — Anesthesia Procedure Notes (Signed)
Procedure Name: LMA Insertion Date/Time: 09/22/2022 11:03 AM  Performed by: Gwyndolyn Saxon, CRNAPre-anesthesia Checklist: Patient identified, Emergency Drugs available, Suction available and Patient being monitored Patient Re-evaluated:Patient Re-evaluated prior to induction Oxygen Delivery Method: Circle system utilized Preoxygenation: Pre-oxygenation with 100% oxygen Induction Type: IV induction Ventilation: Mask ventilation without difficulty and Oral airway inserted - appropriate to patient size LMA: LMA with gastric port inserted LMA Size: 4.0 Number of attempts: 2 Placement Confirmation: positive ETCO2 and breath sounds checked- equal and bilateral Tube secured with: Tape Dental Injury: Teeth and Oropharynx as per pre-operative assessment  Comments: First attempt with #5 flexible LMA; unable to seat. Second attempt with #4 LMA with gastric port without issue

## 2022-09-23 NOTE — Anesthesia Postprocedure Evaluation (Signed)
Anesthesia Post Note  Patient: Roger Phillips  Procedure(s) Performed: CYSTOSCOPY WITH URETHRAL DILATATION-optilume (Urethra)  Patient location during evaluation: Phase II Anesthesia Type: General Level of consciousness: awake Pain management: pain level controlled Vital Signs Assessment: post-procedure vital signs reviewed and stable Respiratory status: spontaneous breathing and respiratory function stable Cardiovascular status: blood pressure returned to baseline and stable Postop Assessment: no headache and no apparent nausea or vomiting Anesthetic complications: no Comments: Late entry   No notable events documented.   Last Vitals:  Vitals:   09/22/22 1200 09/22/22 1215  BP: 132/77 125/81  Pulse: 73 76  Resp: 13   Temp:    SpO2: 92% 96%    Last Pain:  Vitals:   09/22/22 1215  TempSrc:   PainSc: [place]

## 2022-09-25 ENCOUNTER — Other Ambulatory Visit: Payer: Self-pay | Admitting: Family Medicine

## 2022-09-25 MED ORDER — GLIPIZIDE 10 MG PO TABS: ORAL_TABLET | ORAL | 1 refills | Status: DC

## 2022-09-25 MED ORDER — HYDROCHLOROTHIAZIDE 25 MG PO TABS: ORAL_TABLET | ORAL | 1 refills | Status: DC

## 2022-09-25 MED ORDER — AMLODIPINE BESYLATE 5 MG PO TABS: ORAL_TABLET | ORAL | 1 refills | Status: DC

## 2022-09-25 MED ORDER — FLUTICASONE-SALMETEROL 250-50 MCG/ACT IN AEPB: INHALATION_SPRAY | RESPIRATORY_TRACT | 5 refills | Status: DC

## 2022-09-25 MED ORDER — LOSARTAN POTASSIUM 50 MG PO TABS: ORAL_TABLET | ORAL | 1 refills | Status: DC

## 2022-09-26 ENCOUNTER — Ambulatory Visit (INDEPENDENT_AMBULATORY_CARE_PROVIDER_SITE_OTHER): Payer: PPO | Admitting: Urology

## 2022-09-26 DIAGNOSIS — N411 Chronic prostatitis: Secondary | ICD-10-CM | POA: Diagnosis not present

## 2022-09-26 NOTE — Progress Notes (Signed)
Catheter Removal  Patient is present today for a catheter removal.  10 ml of water was drained from the balloon. A 16 FR foley cath was removed from the bladder, no complications were noted. Patient tolerated well. Made patient aware to drink lots of fluids and return to office at 12 pm for PVR.  Performed by: [id number] CMA  Follow up/ Additional notes: Follow at 12 pm for PVR Pt here today for bladder scan. Bladder was scanned and 30 was visualized.   Performed by [id number] CMA  Additional follow up follow up as scheduled

## 2022-10-01 ENCOUNTER — Encounter (HOSPITAL_COMMUNITY): Payer: Self-pay | Admitting: Urology

## 2022-10-07 NOTE — Addendum Note (Signed)
Addended by: Dairl Ponder on: 10/07/2022 04:30 PM   Modules accepted: Orders

## 2022-10-08 ENCOUNTER — Ambulatory Visit (INDEPENDENT_AMBULATORY_CARE_PROVIDER_SITE_OTHER): Payer: PPO | Admitting: Urology

## 2022-10-08 ENCOUNTER — Encounter: Payer: Self-pay | Admitting: Urology

## 2022-10-08 VITALS — BP 118/69 | HR 65

## 2022-10-08 DIAGNOSIS — N35011 Post-traumatic bulbous urethral stricture: Secondary | ICD-10-CM | POA: Diagnosis not present

## 2022-10-08 DIAGNOSIS — N138 Other obstructive and reflux uropathy: Secondary | ICD-10-CM | POA: Insufficient documentation

## 2022-10-08 DIAGNOSIS — R3915 Urgency of urination: Secondary | ICD-10-CM

## 2022-10-08 MED ORDER — NITROFURANTOIN MONOHYD MACRO 100 MG PO CAPS: 100.0000 mg | ORAL_CAPSULE | Freq: Two times a day (BID) | ORAL | 0 refills | Status: DC

## 2022-10-08 NOTE — Patient Instructions (Signed)

## 2022-10-08 NOTE — Progress Notes (Signed)
10/08/2022 11:09 AM   BERNHARDT RIEMENSCHNEIDER 04-20-1956 [id number]  Referring provider: Kathyrn Drown, MD [address] [place],  [place] [address]  Followup urethral stricture   HPI: Mr Ghan is a 67yo here for followup for urethral stricture. He underwent Optilume 2 weeks ago. His dysuria has significantly improved. He has urinary urgency and frequency. He has nocturia 2-3x. NO hematuria.    PMH: Past Medical History:  Diagnosis Date   Arthritis    neck and left shoulder   Asthma    Back pain    buldging disc   Complication of anesthesia    difficult to wake up after neck surgery   COPD (chronic obstructive pulmonary disease) (HCC)    Depression    takes Wellbutrin   Diabetes mellitus    takes Metformin bid   Emphysema    GERD (gastroesophageal reflux disease)    takes Omeprazole daily   Hemorrhoids    High cholesterol    takes Pravastatin daily   HTN (hypertension)    takes Lisinopril,Norvasc,and HCTZ daily   IBS (irritable bowel syndrome)    Insomnia    doesn't take any meds    Pneumonia    hx of in 80's and 90's   Prostate cancer (HCC)    Sinus headache    sinus drainage    Surgical History: Past Surgical History:  Procedure Laterality Date   ANTERIOR CERVICAL DECOMP/DISCECTOMY FUSION  12/04/2011   Procedure: ANTERIOR CERVICAL DECOMPRESSION/DISCECTOMY FUSION 2 LEVELS;  Surgeon: Hosie Spangle, MD;  Location: [facility];  Service: Neurosurgery;  Laterality: N/A;  Cervical Three-Four, Cervical Four-Five anterior cervial decompression with fusion plating and bonegraft   CARPAL TUNNEL RELEASE  10+yrs   right side   COLONOSCOPY N/A 04/19/2014   Procedure: COLONOSCOPY;  Surgeon: Rogene Houston, MD;  [facility];  Service: Endoscopy;  Laterality: N/A;  1030-moved to 1230 Ann to notify pt   CYSTECTOMY  12-3yr ago   from left side of neck   CYSTOSCOPY WITH URETHRAL DILATATION N/A 09/22/2022   Procedure: CYSTOSCOPY WITH URETHRAL  DILATATION-optilume;  Surgeon: MCleon Gustin MD;  Location: [facility];  Service: Urology;  Laterality: N/A;   GOLD SEED IMPLANT N/A 12/06/2020   Procedure: GOLD SEED IMPLANT;  Surgeon: MCleon Gustin MD;  Location: [facility];  Service: Urology;  Laterality: N/A;   SPACE OAR INSTILLATION N/A 12/06/2020   Procedure: SPACE OAR INSTILLATION;  Surgeon: MCleon Gustin MD;  Location: [facility];  Service: Urology;  Laterality: N/A;    Home Medications:  Allergies as of 10/08/2022       Reactions   Doxycycline Diarrhea   Morphine And Related Other (See Comments)   Insomnia   Sulfa Antibiotics Rash        Medication List        Accurate as of October 08, 2022 11:09 AM. If you have any questions, ask your nurse or doctor.          Accu-Chek Aviva Plus w/Device Kit   Accu-Chek Aviva Soln   Accu-Chek Softclix Lancets lancets   freestyle lancets Use to check blood sugar TID   albuterol 108 (90 Base) MCG/ACT inhaler Commonly known as: VENTOLIN HFA INHALE 2 PUFFS INTO THE LUNGS EVERY (4) HOURS AS NEEDED. SHAKE WELL BEFORE EACH USE.   amLODipine 5 MG tablet Commonly known as: NORVASC TAKE ONE (1) TABLET BY MOUTH EVERY DAY   B-D SINGLE USE SWABS REGULAR Pads   cetirizine  10 MG tablet Commonly known as: ZYRTEC TAKE ONE TABLET BY MOUTH AT BEDTIME.   diphenoxylate-atropine 2.5-0.025 MG tablet Commonly known as: LOMOTIL TAKE ONE TABLET BY MOUTH TWICE A DAY AS NEEDED FOR DIARRHEA DUE TO PROSTRATE RADIATION.   fluticasone-salmeterol 250-50 MCG/ACT Aepb Commonly known as: Advair Diskus Inhale one puff po BID   FREESTYLE LITE test strip Generic drug: glucose blood USE TO TEST BLOOD GLUCOSE THREE TIMES DAILY   glipiZIDE 10 MG tablet Commonly known as: GLUCOTROL TAKE ONE  TABLET BY MOUTH TWICE A DAY   hydrochlorothiazide 25 MG tablet Commonly known as: HYDRODIURIL TAKE 1 TABLET BY MOUTH DAILY.   HYDROcodone-acetaminophen 5-325 MG tablet Commonly known as: Norco Take 1  tablet by mouth every 6 (six) hours as needed for moderate pain.   insulin aspart 100 UNIT/ML FlexPen Commonly known as: NOVOLOG 8 units with lunch and dinner -may titrate up to 16 units   Lantus SoloStar 100 UNIT/ML Solostar Pen Generic drug: insulin glargine 40 units qhs. May titrate up to 60 units   losartan 50 MG tablet Commonly known as: COZAAR TAKE 1 TABLET BY MOUTH DAILY.   omeprazole 20 MG capsule Commonly known as: PRILOSEC Take 1 capsule by mouth daily.   omeprazole 20 MG capsule Commonly known as: PRILOSEC TAKE 1 TABLET BY MOUTH TWICE DAILY FOR GERD.   omeprazole 20 MG capsule Commonly known as: PRILOSEC TAKE 1 TABLET BY MOUTH TWICE DAILY FOR GERD.   phenazopyridine 100 MG tablet Commonly known as: Pyridium Take 2 to 3 pills daily as needed.   phenazopyridine 100 MG tablet Commonly known as: PYRIDIUM TAKE 1 TABLET BY MOUTH (3) TIMES DAILY AS NEEDED FOR PAIN.   pravastatin 40 MG tablet Commonly known as: PRAVACHOL Take 1 tablet (40 mg total) by mouth daily.   sildenafil 20 MG tablet Commonly known as: REVATIO TAKE UP TO THREE TABLETS BY MOUTH AS NEEDED   sucralfate 1 g tablet Commonly known as: CARAFATE TAKE ONE (1) TABLET BY MOUTH 3 TIMES DAILY FOR GERD.   Uribel 118 MG Caps Take 1 tablet 2 times per day prn.        Allergies:  Allergies  Allergen Reactions   Doxycycline Diarrhea   Morphine And Related Other (See Comments)    Insomnia    Sulfa Antibiotics Rash    Family History: Family History  Problem Relation Age of Onset   Heart disease Father    Hypertension Father    Heart disease Brother    Anesthesia problems Neg Hx    Hypotension Neg Hx    Malignant hyperthermia Neg Hx    Pseudochol deficiency Neg Hx    Breast cancer Neg Hx    Prostate cancer Neg Hx    Colon cancer Neg Hx    Pancreatic cancer Neg Hx     Social History:  reports that he has been smoking cigarettes. He has a 60.00 pack-year smoking history. He has never  used smokeless tobacco. He reports current alcohol use. He reports that he does not use drugs.  ROS: All other review of systems were reviewed and are negative except what is noted above in HPI  Physical Exam: BP 118/69   Pulse 65   Constitutional:  Alert and oriented, No acute distress. HEENT: [place] AT, moist mucus membranes.  Trachea midline, no masses. Cardiovascular: No clubbing, cyanosis, or edema. Respiratory: Normal respiratory effort, no increased work of breathing. GI: Abdomen is soft, nontender, nondistended, no abdominal masses GU: No CVA tenderness.  Lymph:  No cervical or inguinal lymphadenopathy. Skin: No rashes, bruises or suspicious lesions. Neurologic: Grossly intact, no focal deficits, moving all 4 extremities. Psychiatric: Normal mood and affect.  Laboratory Data: Lab Results  Component Value Date   WBC 6.9 03/30/2013   HGB 16.4 03/30/2013   HCT 45.8 03/30/2013   MCV 85.3 03/30/2013   PLT 247 03/30/2013    Lab Results  Component Value Date   CREATININE 1.12 09/08/2022    Lab Results  Component Value Date   PSA 3.68 07/07/2013    No results found for: "TESTOSTERONE"  Lab Results  Component Value Date   HGBA1C 8.6 (H) 09/08/2022    Urinalysis    Component Value Date/Time   APPEARANCEUR Hazy (A) 09/01/2022 1547   GLUCOSEU Trace (A) 09/01/2022 1547   BILIRUBINUR Negative 09/01/2022 1547   PROTEINUR 2+ (A) 09/01/2022 1547   NITRITE Positive (A) 09/01/2022 1547   LEUKOCYTESUR 3+ (A) 09/01/2022 1547    Lab Results  Component Value Date   LABMICR 21.9 09/08/2022   WBCUA None seen 09/01/2022   LABEPIT 0-10 09/01/2022   MUCUS Present 05/28/2022   BACTERIA None seen 09/01/2022    Pertinent Imaging:  No results found for this or any previous visit.  No results found for this or any previous visit.  No results found for this or any previous visit.  No results found for this or any previous visit.  No results found for this or any previous  visit.  No valid procedures specified. No results found for this or any previous visit.  Results for orders placed during the hospital encounter of 04/21/22  CT RENAL STONE STUDY  Narrative CLINICAL DATA:  Nephrolithiasis.  Dysuria.  EXAM: CT ABDOMEN AND PELVIS WITHOUT CONTRAST  TECHNIQUE: Multidetector CT imaging of the abdomen and pelvis was performed following the standard protocol without IV contrast.  RADIATION DOSE REDUCTION: This exam was performed according to the departmental dose-optimization program which includes automated exposure control, adjustment of the mA and/or kV according to patient size and/or use of iterative reconstruction technique.  COMPARISON:  Overlapping portions CT chest 07/28/2018  FINDINGS: Lower chest: Lingular bulla, image 6 series 4. Mild scarring or atelectasis in the right lower lobe. Descending thoracic aortic atherosclerosis and circumflex coronary artery atherosclerosis.  Hepatobiliary: Multiple gallstones measuring up to 0.7 cm in diameter. No biliary dilatation. Otherwise unremarkable.  Pancreas: Unremarkable  Spleen: Unremarkable  Adrenals/Urinary Tract: Both adrenal glands appear normal.  There are proximally 8 nonobstructive right renal calculi, one of the largest in the right kidney lower pole measures 0.8 cm in long axis.  Two left kidney lower pole calculi are identified, the larger measuring 0.5 cm in short axis.  No hydronephrosis or hydroureter. No ureteral or bladder calculus identified. Wall thickening in the urinary bladder is least partially from nondistention but there could certainly be a component of cystitis.  Stomach/Bowel: Unremarkable  Vascular/Lymphatic: Atherosclerosis is present, including aortoiliac atherosclerotic disease. No pathologic adenopathy observed.  Reproductive: Prostatomegaly. Fiducials noted along the prostate margins.  Other: No supplemental non-categorized  findings.  Musculoskeletal: Mild bridging spurring anteriorly in the sacroiliac joints. Mild bilateral degenerative hip arthropathy. Lumbar spondylosis and degenerative disc disease causing impingement especially at L4-5.  IMPRESSION: 1. Bilateral nonobstructive renal calculi. No hydronephrosis, hydroureter, or ureteral/bladder calculus. 2. Bladder wall thickening may partially be due to nondistention but superimposed cystitis is not excluded. 3. Prostatomegaly with fiducials in place. 4. Other imaging findings of potential clinical significance: Aortic Atherosclerosis (ICD10-I70.0). Coronary atherosclerosis. Cholelithiasis. Mild  bilateral degenerative hip arthropathy. Lumbar impingement especially at L4-5.   Electronically Signed By: Van Clines M.D. On: 04/22/2022 14:52   Assessment & Plan:    1. Urinary urgency -urine for culture. Macrobid 168m BID for 7 days - Urinalysis, Routine w reflex microscopic  2. Post-traumatic bulbous urethral stricture -followup 3 months with flow PVR   No follow-ups on file.  PNicolette Bang MD  [facility]

## 2022-10-21 ENCOUNTER — Other Ambulatory Visit: Payer: Self-pay | Admitting: *Deleted

## 2022-10-21 ENCOUNTER — Other Ambulatory Visit: Payer: Self-pay | Admitting: Family Medicine

## 2022-10-21 MED ORDER — ALBUTEROL SULFATE HFA 108 (90 BASE) MCG/ACT IN AERS: INHALATION_SPRAY | RESPIRATORY_TRACT | 0 refills | Status: DC

## 2022-10-30 ENCOUNTER — Other Ambulatory Visit: Payer: Self-pay | Admitting: Family Medicine

## 2022-11-04 ENCOUNTER — Other Ambulatory Visit: Payer: Self-pay | Admitting: Urology

## 2022-11-17 ENCOUNTER — Ambulatory Visit: Payer: PPO | Admitting: Urology

## 2022-11-26 ENCOUNTER — Ambulatory Visit (INDEPENDENT_AMBULATORY_CARE_PROVIDER_SITE_OTHER): Payer: PPO | Admitting: Urology

## 2022-11-26 ENCOUNTER — Encounter: Payer: Self-pay | Admitting: Urology

## 2022-11-26 VITALS — BP 147/71 | HR 73

## 2022-11-26 DIAGNOSIS — N401 Enlarged prostate with lower urinary tract symptoms: Secondary | ICD-10-CM | POA: Diagnosis not present

## 2022-11-26 DIAGNOSIS — N35011 Post-traumatic bulbous urethral stricture: Secondary | ICD-10-CM

## 2022-11-26 DIAGNOSIS — R300 Dysuria: Secondary | ICD-10-CM

## 2022-11-26 DIAGNOSIS — N138 Other obstructive and reflux uropathy: Secondary | ICD-10-CM | POA: Diagnosis not present

## 2022-11-26 LAB — MICROSCOPIC EXAMINATION: Bacteria, UA: NONE SEEN

## 2022-11-26 LAB — URINALYSIS, ROUTINE W REFLEX MICROSCOPIC
Bilirubin, UA: NEGATIVE
Glucose, UA: NEGATIVE
Ketones, UA: NEGATIVE
Leukocytes,UA: NEGATIVE
Nitrite, UA: NEGATIVE
RBC, UA: NEGATIVE
Specific Gravity, UA: 1.025 (ref 1.005–1.030)
Urobilinogen, Ur: 0.2 mg/dL (ref 0.2–1.0)
pH, UA: 5 (ref 5.0–7.5)

## 2022-11-26 MED ORDER — ALFUZOSIN HCL ER 10 MG PO TB24: 10.0000 mg | ORAL_TABLET | Freq: Every day | ORAL | 11 refills | Status: DC

## 2022-11-26 NOTE — Progress Notes (Unsigned)
11/26/2022 11:35 AM   Roger Phillips Feb 08, 1956 [id number]  Referring provider: Kathyrn Drown, MD [address] [place],  [place] [address]  Followup BPh and urethral stricture   HPI:  [name] is a 67yo here for followup for BPH and urethral stricture. IPSS 16 QOL 4 on no BPH medication. Urine stream strong. No straining to urinate. Nocturia 2-3x. He has stopped pyridium. He has intermittent burning in his perineum and near his rectum.   PMH: Past Medical History:  Diagnosis Date   Arthritis    neck and left shoulder   Asthma    Back pain    buldging disc   Complication of anesthesia    difficult to wake up after neck surgery   COPD (chronic obstructive pulmonary disease) (HCC)    Depression    takes Wellbutrin   Diabetes mellitus    takes Metformin bid   Emphysema    GERD (gastroesophageal reflux disease)    takes Omeprazole daily   Hemorrhoids    High cholesterol    takes Pravastatin daily   HTN (hypertension)    takes Lisinopril,Norvasc,and HCTZ daily   IBS (irritable bowel syndrome)    Insomnia    doesn't take any meds    Pneumonia    hx of in 80's and 90's   Prostate cancer (HCC)    Sinus headache    sinus drainage    Surgical History: Past Surgical History:  Procedure Laterality Date   ANTERIOR CERVICAL DECOMP/DISCECTOMY FUSION  12/04/2011   Procedure: ANTERIOR CERVICAL DECOMPRESSION/DISCECTOMY FUSION 2 LEVELS;  Surgeon: Roger Spangle, MD;  Location: [facility];  Service: Neurosurgery;  Laterality: N/A;  Cervical Three-Four, Cervical Four-Five anterior cervial decompression with fusion plating and bonegraft   CARPAL TUNNEL RELEASE  10+yrs   right side   COLONOSCOPY N/A 04/19/2014   Procedure: COLONOSCOPY;  Surgeon: Roger Houston, MD;  [facility];  Service: Endoscopy;  Laterality: N/A;  1030-moved to 1230 Ann to notify pt   CYSTECTOMY  12-28yr ago   from left side of neck   CYSTOSCOPY WITH URETHRAL DILATATION N/A  09/22/2022   Procedure: CYSTOSCOPY WITH URETHRAL DILATATION-optilume;  Surgeon: Roger Gustin MD;  Location: [facility];  Service: Urology;  Laterality: N/A;   GOLD SEED IMPLANT N/A 12/06/2020   Procedure: GOLD SEED IMPLANT;  Surgeon: Roger Gustin MD;  Location: [facility];  Service: Urology;  Laterality: N/A;   SPACE OAR INSTILLATION N/A 12/06/2020   Procedure: SPACE OAR INSTILLATION;  Surgeon: Roger Gustin MD;  Location: [facility];  Service: Urology;  Laterality: N/A;    Home Medications:  Allergies as of 11/26/2022       Reactions   Doxycycline Diarrhea   Morphine And Related Other (See Comments)   Insomnia   Sulfa Antibiotics Rash        Medication List        Accurate as of November 26, 2022 11:35 AM. If you have any questions, ask your nurse or doctor.          Accu-Chek Aviva Plus w/Device Kit   Accu-Chek Aviva Soln   Accu-Chek Softclix Lancets lancets   freestyle lancets Use to check blood sugar TID   albuterol 108 (90 Base) MCG/ACT inhaler Commonly known as: VENTOLIN HFA INHALE 2 PUFFS INTO THE LUNGS EVERY (4) HOURS AS NEEDED. SHAKE WELL BEFORE EACH USE.   amLODipine 5 MG tablet Commonly known as: NORVASC TAKE ONE (1) TABLET BY MOUTH  EVERY DAY   B-D SINGLE USE SWABS REGULAR Pads   cetirizine 10 MG tablet Commonly known as: ZYRTEC TAKE ONE TABLET BY MOUTH AT BEDTIME.   diphenoxylate-atropine 2.5-0.025 MG tablet Commonly known as: LOMOTIL TAKE ONE TABLET BY MOUTH TWICE A DAY AS NEEDED FOR DIARRHEA DUE TO PROSTRATE RADIATION.   fluticasone-salmeterol 250-50 MCG/ACT Aepb Commonly known as: Advair Diskus Inhale one puff po BID   FREESTYLE LITE test strip Generic drug: glucose blood USE TO TEST BLOOD GLUCOSE THREE TIMES DAILY   glipiZIDE 10 MG tablet Commonly known as: GLUCOTROL TAKE ONE  TABLET BY MOUTH TWICE A DAY   hydrochlorothiazide 25 MG tablet Commonly known as: HYDRODIURIL TAKE 1 TABLET BY MOUTH DAILY.    HYDROcodone-acetaminophen 5-325 MG tablet Commonly known as: Norco Take 1 tablet by mouth every 6 (six) hours as needed for moderate pain.   insulin aspart 100 UNIT/ML FlexPen Commonly known as: NOVOLOG 8 units with lunch and dinner -may titrate up to 16 units   Lantus SoloStar 100 UNIT/ML Solostar Pen Generic drug: insulin glargine 40 units qhs. May titrate up to 60 units   losartan 50 MG tablet Commonly known as: COZAAR TAKE 1 TABLET BY MOUTH DAILY.   nitrofurantoin (macrocrystal-monohydrate) 100 MG capsule Commonly known as: MACROBID Take 1 capsule (100 mg total) by mouth every 12 (twelve) hours.   omeprazole 20 MG capsule Commonly known as: PRILOSEC Take 1 capsule by mouth daily.   omeprazole 20 MG capsule Commonly known as: PRILOSEC TAKE 1 TABLET BY MOUTH TWICE DAILY FOR GERD.   omeprazole 20 MG capsule Commonly known as: PRILOSEC TAKE 1 TABLET BY MOUTH TWICE DAILY FOR GERD.   phenazopyridine 100 MG tablet Commonly known as: Pyridium Take 2 to 3 pills daily as needed.   phenazopyridine 100 MG tablet Commonly known as: PYRIDIUM TAKE 1 TABLET BY MOUTH (3) TIMES DAILY AS NEEDED FOR PAIN.   phenazopyridine 100 MG tablet Commonly known as: PYRIDIUM TAKE 1 TABLET BY MOUTH (3) TIMES DAILY AS NEEDED FOR PAIN.   pravastatin 40 MG tablet Commonly known as: PRAVACHOL Take 1 tablet (40 mg total) by mouth daily.   sildenafil 20 MG tablet Commonly known as: REVATIO TAKE UP TO THREE TABLETS BY MOUTH AS NEEDED   sucralfate 1 g tablet Commonly known as: CARAFATE TAKE ONE (1) TABLET BY MOUTH 3 TIMES DAILY FOR GERD.   Uribel 118 MG Caps Take 1 tablet 2 times per day prn.        Allergies:  Allergies  Allergen Reactions   Doxycycline Diarrhea   Morphine And Related Other (See Comments)    Insomnia    Sulfa Antibiotics Rash    Family History: Family History  Problem Relation Age of Onset   Heart disease Father    Hypertension Father    Heart disease  Brother    Anesthesia problems Neg Hx    Hypotension Neg Hx    Malignant hyperthermia Neg Hx    Pseudochol deficiency Neg Hx    Breast cancer Neg Hx    Prostate cancer Neg Hx    Colon cancer Neg Hx    Pancreatic cancer Neg Hx     Social History:  reports that he has been smoking cigarettes. He has a 60.00 pack-year smoking history. He has never used smokeless tobacco. He reports current alcohol use. He reports that he does not use drugs.  ROS: All other review of systems were reviewed and are negative except what is noted above in HPI  Physical  Exam: BP (!) 147/71   Pulse 73   Constitutional:  Alert and oriented, No acute distress. HEENT: [place] AT, moist mucus membranes.  Trachea midline, no masses. Cardiovascular: No clubbing, cyanosis, or edema. Respiratory: Normal respiratory effort, no increased work of breathing. GI: Abdomen is soft, nontender, nondistended, no abdominal masses GU: No CVA tenderness.  Lymph: No cervical or inguinal lymphadenopathy. Skin: No rashes, bruises or suspicious lesions. Neurologic: Grossly intact, no focal deficits, moving all 4 extremities. Psychiatric: Normal mood and affect.  Laboratory Data: Lab Results  Component Value Date   WBC 6.9 03/30/2013   HGB 16.4 03/30/2013   HCT 45.8 03/30/2013   MCV 85.3 03/30/2013   PLT 247 03/30/2013    Lab Results  Component Value Date   CREATININE 1.12 09/08/2022    Lab Results  Component Value Date   PSA 3.68 07/07/2013    No results found for: "TESTOSTERONE"  Lab Results  Component Value Date   HGBA1C 8.6 (H) 09/08/2022    Urinalysis    Component Value Date/Time   APPEARANCEUR Hazy (A) 09/01/2022 1547   GLUCOSEU Trace (A) 09/01/2022 1547   BILIRUBINUR Negative 09/01/2022 1547   PROTEINUR 2+ (A) 09/01/2022 1547   NITRITE Positive (A) 09/01/2022 1547   LEUKOCYTESUR 3+ (A) 09/01/2022 1547    Lab Results  Component Value Date   LABMICR 21.9 09/08/2022   WBCUA None seen 09/01/2022    LABEPIT 0-10 09/01/2022   MUCUS Present 05/28/2022   BACTERIA None seen 09/01/2022    Pertinent Imaging:  No results found for this or any previous visit.  No results found for this or any previous visit.  No results found for this or any previous visit.  No results found for this or any previous visit.  No results found for this or any previous visit.  No valid procedures specified. No results found for this or any previous visit.  Results for orders placed during the hospital encounter of 04/21/22  CT RENAL STONE STUDY  Narrative CLINICAL DATA:  Nephrolithiasis.  Dysuria.  EXAM: CT ABDOMEN AND PELVIS WITHOUT CONTRAST  TECHNIQUE: Multidetector CT imaging of the abdomen and pelvis was performed following the standard protocol without IV contrast.  RADIATION DOSE REDUCTION: This exam was performed according to the departmental dose-optimization program which includes automated exposure control, adjustment of the mA and/or kV according to patient size and/or use of iterative reconstruction technique.  COMPARISON:  Overlapping portions CT chest 07/28/2018  FINDINGS: Lower chest: Lingular bulla, image 6 series 4. Mild scarring or atelectasis in the right lower lobe. Descending thoracic aortic atherosclerosis and circumflex coronary artery atherosclerosis.  Hepatobiliary: Multiple gallstones measuring up to 0.7 cm in diameter. No biliary dilatation. Otherwise unremarkable.  Pancreas: Unremarkable  Spleen: Unremarkable  Adrenals/Urinary Tract: Both adrenal glands appear normal.  There are proximally 8 nonobstructive right renal calculi, one of the largest in the right kidney lower pole measures 0.8 cm in long axis.  Two left kidney lower pole calculi are identified, the larger measuring 0.5 cm in short axis.  No hydronephrosis or hydroureter. No ureteral or bladder calculus identified. Wall thickening in the urinary bladder is least partially from  nondistention but there could certainly be a component of cystitis.  Stomach/Bowel: Unremarkable  Vascular/Lymphatic: Atherosclerosis is present, including aortoiliac atherosclerotic disease. No pathologic adenopathy observed.  Reproductive: Prostatomegaly. Fiducials noted along the prostate margins.  Other: No supplemental non-categorized findings.  Musculoskeletal: Mild bridging spurring anteriorly in the sacroiliac joints. Mild bilateral degenerative hip arthropathy. Lumbar spondylosis  and degenerative disc disease causing impingement especially at L4-5.  IMPRESSION: 1. Bilateral nonobstructive renal calculi. No hydronephrosis, hydroureter, or ureteral/bladder calculus. 2. Bladder wall thickening may partially be due to nondistention but superimposed cystitis is not excluded. 3. Prostatomegaly with fiducials in place. 4. Other imaging findings of potential clinical significance: Aortic Atherosclerosis (ICD10-I70.0). Coronary atherosclerosis. Cholelithiasis. Mild bilateral degenerative hip arthropathy. Lumbar impingement especially at L4-5.   Electronically Signed By: Van Clines M.D. On: 04/22/2022 14:52   Assessment & Plan:    1. Benign prostatic hyperplasia with urinary obstruction -uroxatral 70m qhs - Urinalysis, Routine w reflex microscopic  2. Post-traumatic bulbous urethral stricture -followup 3 months with Flow PVR  3. Burning with urination -uroxatral 18mdaily   No follow-ups on file.  PaNicolette BangMD  Co[facility] [place]

## 2022-11-26 NOTE — Patient Instructions (Signed)
Benign Prostatic Hyperplasia ? ?Benign prostatic hyperplasia (BPH) is an enlarged prostate gland that is caused by the normal aging process. The prostate may get bigger as a man gets older. The condition is not caused by cancer. The prostate is a walnut-sized gland that is involved in the production of semen. It is located in front of the rectum and below the bladder. The bladder stores urine. The urethra carries stored urine out of the body. ?An enlarged prostate can press on the urethra. This can make it harder to pass urine. The buildup of urine in the bladder can cause infection. Back pressure and infection may progress to bladder damage and kidney (renal) failure. ?What are the causes? ?This condition is part of the normal aging process. However, not all men develop problems from this condition. If the prostate enlarges away from the urethra, urine flow will not be blocked. If it enlarges toward the urethra and compresses it, there will be problems passing urine. ?What increases the risk? ?This condition is more likely to develop in men older than 50 years. ?What are the signs or symptoms? ?Symptoms of this condition include: ?Getting up often during the night to urinate. ?Needing to urinate frequently during the day. ?Difficulty starting urine flow. ?Decrease in size and strength of your urine stream. ?Leaking (dribbling) after urinating. ?Inability to pass urine. This needs immediate treatment. ?Inability to completely empty your bladder. ?Pain when you pass urine. This is more common if there is also an infection. ?Urinary tract infection (UTI). ?How is this diagnosed? ?This condition is diagnosed based on your medical history, a physical exam, and your symptoms. Tests will also be done, such as: ?A post-void bladder scan. This measures any amount of urine that may remain in your bladder after you finish urinating. ?A digital rectal exam. In a rectal exam, your health care provider checks your prostate by  putting a lubricated, gloved finger into your rectum to feel the back of your prostate gland. This exam detects the size of your gland and any abnormal lumps or growths. ?An exam of your urine (urinalysis). ?A prostate specific antigen (PSA) screening. This is a blood test used to screen for prostate cancer. ?An ultrasound. This test uses sound waves to electronically produce a picture of your prostate gland. ?Your health care provider may refer you to a specialist in kidney and prostate diseases (urologist). ?How is this treated? ?Once symptoms begin, your health care provider will monitor your condition (active surveillance or watchful waiting). Treatment for this condition will depend on the severity of your condition. Treatment may include: ?Observation and yearly exams. This may be the only treatment needed if your condition and symptoms are mild. ?Medicines to relieve your symptoms, including: ?Medicines to shrink the prostate. ?Medicines to relax the muscle of the prostate. ?Surgery in severe cases. Surgery may include: ?Prostatectomy. In this procedure, the prostate tissue is removed completely through an open incision or with a laparoscope or robotics. ?Transurethral resection of the prostate (TURP). In this procedure, a tool is inserted through the opening at the tip of the penis (urethra). It is used to cut away tissue of the inner core of the prostate. The pieces are removed through the same opening of the penis. This removes the blockage. ?Transurethral incision (TUIP). In this procedure, small cuts are made in the prostate. This lessens the prostate's pressure on the urethra. ?Transurethral microwave thermotherapy (TUMT). This procedure uses microwaves to create heat. The heat destroys and removes a small amount of   prostate tissue. ?Transurethral needle ablation (TUNA). This procedure uses radio frequencies to destroy and remove a small amount of prostate tissue. ?Interstitial laser coagulation (ILC).  This procedure uses a laser to destroy and remove a small amount of prostate tissue. ?Transurethral electrovaporization (TUVP). This procedure uses electrodes to destroy and remove a small amount of prostate tissue. ?Prostatic urethral lift. This procedure inserts an implant to push the lobes of the prostate away from the urethra. ?Follow these instructions at home: ?Take over-the-counter and prescription medicines only as told by your health care provider. ?Monitor your symptoms for any changes. Contact your health care provider with any changes. ?Avoid drinking large amounts of liquid before going to bed or out in public. ?Avoid or reduce how much caffeine or alcohol you drink. ?Give yourself time when you urinate. ?Keep all follow-up visits. This is important. ?Contact a health care provider if: ?You have unexplained back pain. ?Your symptoms do not get better with treatment. ?You develop side effects from the medicine you are taking. ?Your urine becomes very dark or has a bad smell. ?Your lower abdomen becomes distended and you have trouble passing urine. ?Get help right away if: ?You have a fever or chills. ?You suddenly cannot urinate. ?You feel light-headed or very dizzy, or you faint. ?There are large amounts of blood or clots in your urine. ?Your urinary problems become hard to manage. ?You develop moderate to severe low back or flank pain. The flank is the side of your body between the ribs and the hip. ?These symptoms may be an emergency. Get help right away. Call 911. ?Do not wait to see if the symptoms will go away. ?Do not drive yourself to the hospital. ?Summary ?Benign prostatic hyperplasia (BPH) is an enlarged prostate that is caused by the normal aging process. It is not caused by cancer. ?An enlarged prostate can press on the urethra. This can make it hard to pass urine. ?This condition is more likely to develop in men older than 50 years. ?Get help right away if you suddenly cannot urinate. ?This  information is not intended to replace advice given to you by your health care provider. Make sure you discuss any questions you have with your health care provider. ?Document Revised: 04/10/2021 Document Reviewed: 04/10/2021 ?Elsevier Patient Education ? 2023 [employer] ? ?

## 2022-11-26 NOTE — Progress Notes (Unsigned)
Uroflow  Peak Flow: 26m Average Flow: 461mVoided Volume: 6145moiding Time: 1sec Flow Time: 14sec Time to Peak Flow: 1sec

## 2022-12-04 ENCOUNTER — Other Ambulatory Visit: Payer: Self-pay | Admitting: Family Medicine

## 2022-12-16 ENCOUNTER — Other Ambulatory Visit: Payer: Self-pay | Admitting: Family Medicine

## 2023-01-14 ENCOUNTER — Ambulatory Visit (INDEPENDENT_AMBULATORY_CARE_PROVIDER_SITE_OTHER): Payer: PPO | Admitting: Family Medicine

## 2023-01-14 ENCOUNTER — Encounter: Payer: Self-pay | Admitting: Family Medicine

## 2023-01-14 VITALS — BP 110/70 | HR 75 | Ht 72.0 in | Wt 201.4 lb

## 2023-01-14 DIAGNOSIS — E1169 Type 2 diabetes mellitus with other specified complication: Secondary | ICD-10-CM | POA: Diagnosis not present

## 2023-01-14 DIAGNOSIS — E785 Hyperlipidemia, unspecified: Secondary | ICD-10-CM

## 2023-01-14 DIAGNOSIS — Z122 Encounter for screening for malignant neoplasm of respiratory organs: Secondary | ICD-10-CM

## 2023-01-14 DIAGNOSIS — E1165 Type 2 diabetes mellitus with hyperglycemia: Secondary | ICD-10-CM | POA: Diagnosis not present

## 2023-01-14 DIAGNOSIS — I10 Essential (primary) hypertension: Secondary | ICD-10-CM

## 2023-01-14 DIAGNOSIS — Z1211 Encounter for screening for malignant neoplasm of colon: Secondary | ICD-10-CM

## 2023-01-14 MED ORDER — GLIPIZIDE 10 MG PO TABS: ORAL_TABLET | ORAL | 1 refills | Status: DC

## 2023-01-14 MED ORDER — AMLODIPINE BESYLATE 5 MG PO TABS: ORAL_TABLET | ORAL | 1 refills | Status: DC

## 2023-01-14 MED ORDER — LOSARTAN POTASSIUM 50 MG PO TABS: ORAL_TABLET | ORAL | 1 refills | Status: DC

## 2023-01-14 MED ORDER — OMEPRAZOLE 20 MG PO CPDR: DELAYED_RELEASE_CAPSULE | ORAL | 0 refills | Status: DC

## 2023-01-14 MED ORDER — PRAVASTATIN SODIUM 40 MG PO TABS: 40.0000 mg | ORAL_TABLET | Freq: Every day | ORAL | 3 refills | Status: DC

## 2023-01-14 MED ORDER — CETIRIZINE HCL 10 MG PO TABS: ORAL_TABLET | ORAL | 12 refills | Status: DC

## 2023-01-14 MED ORDER — DIPHENOXYLATE-ATROPINE 2.5-0.025 MG PO TABS: ORAL_TABLET | ORAL | 5 refills | Status: DC

## 2023-01-14 MED ORDER — ALBUTEROL SULFATE HFA 108 (90 BASE) MCG/ACT IN AERS: INHALATION_SPRAY | RESPIRATORY_TRACT | 12 refills | Status: DC

## 2023-01-14 MED ORDER — FLUTICASONE-SALMETEROL 250-50 MCG/ACT IN AEPB: INHALATION_SPRAY | RESPIRATORY_TRACT | 5 refills | Status: DC

## 2023-01-14 NOTE — Progress Notes (Signed)
   Subjective:    Patient ID: Roger Phillips, male    DOB: 10-Jan-1956, 67 y.o.   MRN: [id number]  HPI Patient arrives for 5 month follow up. Patient would like to have an update on all refills to 90 days if possible.  Poorly controlled diabetic.  Does not consistently take his medicines Does not use insulin often due to the fact he just does not like the needle We have discussed negative outcomes with diabetes also discussed getting him in with endocrinology he does not want to do this. Patient still smokes he states he is trying to cut back has no plans on quitting he is aware of the side effects of smoking He does take his cholesterol medicine blood pressure medicine He states he tries eat relatively healthy but not always    Review of Systems     Objective:   Physical Exam General-in no acute distress Eyes-no discharge Lungs-respiratory rate normal, CTA CV-no murmurs,RRR Extremities skin warm dry no edema Neuro grossly normal Behavior normal, alert  We did discuss BPH and urinary flow patient states he uses Uroxatrol intermittently He does take his blood pressure medicine regular basis He does use the antidiarrhea medicine once or twice every single day would like refills Denies abusing it States has had the diarrhea issue ever since having radiation for prostate cancer       Assessment & Plan:  1. Primary hypertension HTN- patient seen for follow-up regarding HTN.   Diet, medication compliance, appropriate labs and refills were completed.   Importance of keeping blood pressure under good control to lessen the risk of complications discussed Regular follow-up visits discussed  - Basic Metabolic Panel  2. Hyperlipidemia associated with type 2 diabetes mellitus Hyperlipidemia-importance of diet, weight control, activity, compliance with medications discussed.   Recent labs reviewed.   Any additional labs or refills ordered.   Importance of keeping under good  control discussed. Regular follow-up visits discussed  - Lipid Panel  3. Uncontrolled type 2 diabetes mellitus with hyperglycemia Poor control Does not take his medicines as directed Defers on referral to endocrinology  - Hemoglobin A1c  4. Screening for colon cancer Referral - Ambulatory referral to Gastroenterology  5. Screening for lung cancer Referral - Ambulatory Referral Lung Cancer Screening [facility]  Patient to quit smoking  Twice daily medication for diarrhea as directed Lomotil

## 2023-01-21 ENCOUNTER — Encounter (INDEPENDENT_AMBULATORY_CARE_PROVIDER_SITE_OTHER): Payer: Self-pay | Admitting: *Deleted

## 2023-01-30 ENCOUNTER — Ambulatory Visit: Payer: PPO | Admitting: Urology

## 2023-01-30 DIAGNOSIS — N35011 Post-traumatic bulbous urethral stricture: Secondary | ICD-10-CM

## 2023-01-30 DIAGNOSIS — N138 Other obstructive and reflux uropathy: Secondary | ICD-10-CM

## 2023-03-25 ENCOUNTER — Other Ambulatory Visit: Payer: Self-pay | Admitting: *Deleted

## 2023-03-25 DIAGNOSIS — Z87891 Personal history of nicotine dependence: Secondary | ICD-10-CM

## 2023-03-25 DIAGNOSIS — Z122 Encounter for screening for malignant neoplasm of respiratory organs: Secondary | ICD-10-CM

## 2023-03-25 DIAGNOSIS — F17210 Nicotine dependence, cigarettes, uncomplicated: Secondary | ICD-10-CM

## 2023-03-27 DIAGNOSIS — Z125 Encounter for screening for malignant neoplasm of prostate: Secondary | ICD-10-CM | POA: Diagnosis not present

## 2023-03-27 DIAGNOSIS — R399 Unspecified symptoms and signs involving the genitourinary system: Secondary | ICD-10-CM | POA: Diagnosis not present

## 2023-03-27 DIAGNOSIS — Z923 Personal history of irradiation: Secondary | ICD-10-CM | POA: Diagnosis not present

## 2023-03-27 DIAGNOSIS — C61 Malignant neoplasm of prostate: Secondary | ICD-10-CM | POA: Diagnosis not present

## 2023-04-14 ENCOUNTER — Ambulatory Visit (HOSPITAL_COMMUNITY)
Admission: RE | Admit: 2023-04-14 | Discharge: 2023-04-14 | Disposition: A | Discharge status: Home / Self Care | Payer: PPO | Source: Ambulatory Visit | Attending: Acute Care | Admitting: Acute Care

## 2023-04-14 DIAGNOSIS — Z87891 Personal history of nicotine dependence: Secondary | ICD-10-CM | POA: Insufficient documentation

## 2023-04-14 DIAGNOSIS — F17210 Nicotine dependence, cigarettes, uncomplicated: Secondary | ICD-10-CM | POA: Insufficient documentation

## 2023-04-14 DIAGNOSIS — Z122 Encounter for screening for malignant neoplasm of respiratory organs: Secondary | ICD-10-CM | POA: Insufficient documentation

## 2023-04-20 ENCOUNTER — Other Ambulatory Visit: Payer: Self-pay | Admitting: Acute Care

## 2023-04-20 DIAGNOSIS — F17210 Nicotine dependence, cigarettes, uncomplicated: Secondary | ICD-10-CM

## 2023-04-20 DIAGNOSIS — Z122 Encounter for screening for malignant neoplasm of respiratory organs: Secondary | ICD-10-CM

## 2023-04-20 DIAGNOSIS — Z87891 Personal history of nicotine dependence: Secondary | ICD-10-CM

## 2023-04-28 ENCOUNTER — Other Ambulatory Visit: Payer: Self-pay | Admitting: Family Medicine

## 2023-04-29 ENCOUNTER — Ambulatory Visit: Payer: PPO | Admitting: Urology

## 2023-04-29 VITALS — BP 96/59 | HR 74

## 2023-04-29 DIAGNOSIS — N138 Other obstructive and reflux uropathy: Secondary | ICD-10-CM | POA: Diagnosis not present

## 2023-04-29 DIAGNOSIS — N401 Enlarged prostate with lower urinary tract symptoms: Secondary | ICD-10-CM

## 2023-04-29 DIAGNOSIS — N35011 Post-traumatic bulbous urethral stricture: Secondary | ICD-10-CM | POA: Diagnosis not present

## 2023-04-29 DIAGNOSIS — R300 Dysuria: Secondary | ICD-10-CM | POA: Diagnosis not present

## 2023-04-29 MED ORDER — GEMTESA 75 MG PO TABS: 1.0000 | ORAL_TABLET | Freq: Every day | ORAL | Status: DC

## 2023-04-29 NOTE — Progress Notes (Signed)
04/29/2023 2:12 PM   Roger Phillips 1956/09/16 [id number]  Referring provider: Babs Sciara, MD [address] [place],  [place] [address]  Perinela pain   HPI: Roger Phillips is a 67yo here for followup for BPh and urethral stricture. He denies any penile pain. He continues to have pelvic and perineal pain related to urination. He has dysuria at the beginning and end of urination. Urine stream is strong. He denies straining to urinate.    PMH: Past Medical History:  Diagnosis Date   Arthritis    neck and left shoulder   Asthma    Back pain    buldging disc   Complication of anesthesia    difficult to wake up after neck surgery   COPD (chronic obstructive pulmonary disease) (HCC)    Depression    takes Wellbutrin   Diabetes mellitus    takes Metformin bid   Emphysema    GERD (gastroesophageal reflux disease)    takes Omeprazole daily   Hemorrhoids    High cholesterol    takes Pravastatin daily   HTN (hypertension)    takes Lisinopril,Norvasc,and HCTZ daily   IBS (irritable bowel syndrome)    Insomnia    doesn't take any meds    Pneumonia    hx of in 80's and 90's   Prostate cancer (HCC)    Sinus headache    sinus drainage    Surgical History: Past Surgical History:  Procedure Laterality Date   ANTERIOR CERVICAL DECOMP/DISCECTOMY FUSION  12/04/2011   Procedure: ANTERIOR CERVICAL DECOMPRESSION/DISCECTOMY FUSION 2 LEVELS;  Surgeon: Hewitt Shorts, MD;  Location: [facility];  Service: Neurosurgery;  Laterality: N/A;  Cervical Three-Four, Cervical Four-Five anterior cervial decompression with fusion plating and bonegraft   CARPAL TUNNEL RELEASE  10+yrs   right side   COLONOSCOPY N/A 04/19/2014   Procedure: COLONOSCOPY;  Surgeon: Malissa Hippo, MD;  Location: [facility];  Service: Endoscopy;  Laterality: N/A;  1030-moved to 1230 Ann to notify pt   CYSTECTOMY  12-67yrs ago   from left side of neck   CYSTOSCOPY WITH URETHRAL DILATATION N/A  09/22/2022   Procedure: CYSTOSCOPY WITH URETHRAL DILATATION-optilume;  Surgeon: Malen Gauze, MD;  Location: [facility];  Service: Urology;  Laterality: N/A;   GOLD SEED IMPLANT N/A 12/06/2020   Procedure: GOLD SEED IMPLANT;  Surgeon: Malen Gauze, MD;  Location: [facility];  Service: Urology;  Laterality: N/A;   SPACE OAR INSTILLATION N/A 12/06/2020   Procedure: SPACE OAR INSTILLATION;  Surgeon: Malen Gauze, MD;  Location: [facility];  Service: Urology;  Laterality: N/A;    Home Medications:  Allergies as of 04/29/2023       Reactions   Doxycycline Diarrhea   Morphine And Codeine Other (See Comments)   Insomnia   Sulfa Antibiotics Rash        Medication List        Accurate as of April 29, 2023  2:12 PM. If you have any questions, ask your nurse or doctor.          Accu-Chek Aviva Plus w/Device Kit   Accu-Chek Aviva Soln   Accu-Chek Softclix Lancets lancets   freestyle lancets Use to check blood sugar TID   albuterol 108 (90 Base) MCG/ACT inhaler Commonly known as: VENTOLIN HFA 2 puff q 4 prn   alfuzosin 10 MG 24 hr tablet Commonly known as: UROXATRAL Take 1 tablet (10 mg total) by mouth at bedtime.   amLODipine 5  MG tablet Commonly known as: NORVASC TAKE ONE (1) TABLET BY MOUTH EVERY DAY   B-D SINGLE USE SWABS REGULAR Pads   cetirizine 10 MG tablet Commonly known as: ZYRTEC TAKE ONE TABLET BY MOUTH AT BEDTIME.   diphenoxylate-atropine 2.5-0.025 MG tablet Commonly known as: LOMOTIL 1 bid prn diarrhea due to radiation   fluticasone-salmeterol 250-50 MCG/ACT Aepb Commonly known as: Advair Diskus Inhale one puff po BID   FREESTYLE LITE test strip Generic drug: glucose blood USE TO TEST BLOOD GLUCOSE THREE TIMES DAILY   glipiZIDE 10 MG tablet Commonly known as: GLUCOTROL TAKE ONE  TABLET BY MOUTH TWICE A DAY   hydrochlorothiazide 25 MG tablet Commonly known as: HYDRODIURIL TAKE 1 TABLET BY MOUTH DAILY.   insulin aspart 100 UNIT/ML  FlexPen Commonly known as: NOVOLOG 8 units with lunch and dinner -may titrate up to 16 units   Lantus SoloStar 100 UNIT/ML Solostar Pen Generic drug: insulin glargine 40 units qhs. May titrate up to 60 units   losartan 50 MG tablet Commonly known as: COZAAR TAKE 1 TABLET BY MOUTH DAILY.   nitrofurantoin (macrocrystal-monohydrate) 100 MG capsule Commonly known as: MACROBID Take 1 capsule (100 mg total) by mouth every 12 (twelve) hours.   omeprazole 20 MG capsule Commonly known as: PRILOSEC TAKE 1 TABLET BY MOUTH TWICE DAILY FOR GERD.   phenazopyridine 100 MG tablet Commonly known as: Pyridium Take 2 to 3 pills daily as needed.   pravastatin 40 MG tablet Commonly known as: PRAVACHOL Take 1 tablet (40 mg total) by mouth daily.   sildenafil 20 MG tablet Commonly known as: REVATIO TAKE UP TO THREE TABLETS BY MOUTH AS NEEDED   sucralfate 1 g tablet Commonly known as: CARAFATE TAKE ONE (1) TABLET BY MOUTH 3 TIMES DAILY FOR GERD.   Uribel 118 MG Caps Take 1 tablet 2 times per day prn.        Allergies:  Allergies  Allergen Reactions   Doxycycline Diarrhea   Morphine And Codeine Other (See Comments)    Insomnia    Sulfa Antibiotics Rash    Family History: Family History  Problem Relation Age of Onset   Heart disease Father    Hypertension Father    Heart disease Brother    Anesthesia problems Neg Hx    Hypotension Neg Hx    Malignant hyperthermia Neg Hx    Pseudochol deficiency Neg Hx    Breast cancer Neg Hx    Prostate cancer Neg Hx    Colon cancer Neg Hx    Pancreatic cancer Neg Hx     Social History:  reports that he has been smoking cigarettes. He has a 60 pack-year smoking history. He has never used smokeless tobacco. He reports current alcohol use. He reports that he does not use drugs.  ROS: All other review of systems were reviewed and are negative except what is noted above in HPI  Physical Exam: BP (!) 96/59   Pulse 74   Constitutional:   Alert and oriented, No acute distress. HEENT:  AT, moist mucus membranes.  Trachea midline, no masses. Cardiovascular: No clubbing, cyanosis, or edema. Respiratory: Normal respiratory effort, no increased work of breathing. GI: Abdomen is soft, nontender, nondistended, no abdominal masses GU: No CVA tenderness.  Lymph: No cervical or inguinal lymphadenopathy. Skin: No rashes, bruises or suspicious lesions. Neurologic: Grossly intact, no focal deficits, moving all 4 extremities. Psychiatric: Normal mood and affect.  Laboratory Data: Lab Results  Component Value Date   WBC 6.9 03/30/2013  HGB 16.4 03/30/2013   HCT 45.8 03/30/2013   MCV 85.3 03/30/2013   PLT 247 03/30/2013    Lab Results  Component Value Date   CREATININE 1.12 09/08/2022    Lab Results  Component Value Date   PSA 3.68 07/07/2013    No results found for: "TESTOSTERONE"  Lab Results  Component Value Date   HGBA1C 8.6 (H) 09/08/2022    Urinalysis    Component Value Date/Time   APPEARANCEUR Clear 11/26/2022 1118   GLUCOSEU Negative 11/26/2022 1118   BILIRUBINUR Negative 11/26/2022 1118   PROTEINUR 1+ (A) 11/26/2022 1118   NITRITE Negative 11/26/2022 1118   LEUKOCYTESUR Negative 11/26/2022 1118    Lab Results  Component Value Date   LABMICR See below: 11/26/2022   WBCUA 0-5 11/26/2022   LABEPIT 0-10 11/26/2022   MUCUS Present 05/28/2022   BACTERIA None seen 11/26/2022    Pertinent Imaging:  No results found for this or any previous visit.  No results found for this or any previous visit.  No results found for this or any previous visit.  No results found for this or any previous visit.  No results found for this or any previous visit.  No valid procedures specified. No results found for this or any previous visit.  Results for orders placed during the hospital encounter of 04/21/22  CT RENAL STONE STUDY  Narrative CLINICAL DATA:  Nephrolithiasis.  Dysuria.  EXAM: CT ABDOMEN  AND PELVIS WITHOUT CONTRAST  TECHNIQUE: Multidetector CT imaging of the abdomen and pelvis was performed following the standard protocol without IV contrast.  RADIATION DOSE REDUCTION: This exam was performed according to the departmental dose-optimization program which includes automated exposure control, adjustment of the mA and/or kV according to patient size and/or use of iterative reconstruction technique.  COMPARISON:  Overlapping portions CT chest 07/28/2018  FINDINGS: Lower chest: Lingular bulla, image 6 series 4. Mild scarring or atelectasis in the right lower lobe. Descending thoracic aortic atherosclerosis and circumflex coronary artery atherosclerosis.  Hepatobiliary: Multiple gallstones measuring up to 0.7 cm in diameter. No biliary dilatation. Otherwise unremarkable.  Pancreas: Unremarkable  Spleen: Unremarkable  Adrenals/Urinary Tract: Both adrenal glands appear normal.  There are proximally 8 nonobstructive right renal calculi, one of the largest in the right kidney lower pole measures 0.8 cm in long axis.  Two left kidney lower pole calculi are identified, the larger measuring 0.5 cm in short axis.  No hydronephrosis or hydroureter. No ureteral or bladder calculus identified. Wall thickening in the urinary bladder is least partially from nondistention but there could certainly be a component of cystitis.  Stomach/Bowel: Unremarkable  Vascular/Lymphatic: Atherosclerosis is present, including aortoiliac atherosclerotic disease. No pathologic adenopathy observed.  Reproductive: Prostatomegaly. Fiducials noted along the prostate margins.  Other: No supplemental non-categorized findings.  Musculoskeletal: Mild bridging spurring anteriorly in the sacroiliac joints. Mild bilateral degenerative hip arthropathy. Lumbar spondylosis and degenerative disc disease causing impingement especially at L4-5.  IMPRESSION: 1. Bilateral nonobstructive renal calculi.  No hydronephrosis, hydroureter, or ureteral/bladder calculus. 2. Bladder wall thickening may partially be due to nondistention but superimposed cystitis is not excluded. 3. Prostatomegaly with fiducials in place. 4. Other imaging findings of potential clinical significance: Aortic Atherosclerosis (ICD10-I70.0). Coronary atherosclerosis. Cholelithiasis. Mild bilateral degenerative hip arthropathy. Lumbar impingement especially at L4-5.   Electronically Signed By: Gaylyn Rong M.D. On: 04/22/2022 14:52   Assessment & Plan:    1. Benign prostatic hyperplasia with urinary obstruction -gemtesa 75mg  daily - Urinalysis, Routine w reflex microscopic - BLADDER  SCAN AMB NON-IMAGING  2. Post-traumatic bulbous urethral stricture -followup 3 months with a flow PVR  3. Burning with urination -gemtesa 75mg  daily   No follow-ups on file.  Wilkie Aye, MD  [facility] [place]

## 2023-04-29 NOTE — Progress Notes (Signed)
post void residual=0 ?

## 2023-05-05 ENCOUNTER — Encounter: Payer: Self-pay | Admitting: Urology

## 2023-05-05 NOTE — Patient Instructions (Signed)

## 2023-06-16 ENCOUNTER — Ambulatory Visit (INDEPENDENT_AMBULATORY_CARE_PROVIDER_SITE_OTHER): Payer: PPO | Admitting: Family Medicine

## 2023-06-16 VITALS — BP 115/74 | HR 69 | Temp 98.4°F | Ht 72.0 in | Wt 204.6 lb

## 2023-06-16 DIAGNOSIS — E1169 Type 2 diabetes mellitus with other specified complication: Secondary | ICD-10-CM | POA: Diagnosis not present

## 2023-06-16 DIAGNOSIS — E785 Hyperlipidemia, unspecified: Secondary | ICD-10-CM | POA: Diagnosis not present

## 2023-06-16 DIAGNOSIS — I10 Essential (primary) hypertension: Secondary | ICD-10-CM

## 2023-06-16 DIAGNOSIS — C61 Malignant neoplasm of prostate: Secondary | ICD-10-CM

## 2023-06-16 DIAGNOSIS — Z23 Encounter for immunization: Secondary | ICD-10-CM

## 2023-06-16 DIAGNOSIS — J438 Other emphysema: Secondary | ICD-10-CM

## 2023-06-16 DIAGNOSIS — I70211 Atherosclerosis of native arteries of extremities with intermittent claudication, right leg: Secondary | ICD-10-CM | POA: Diagnosis not present

## 2023-06-16 DIAGNOSIS — E1165 Type 2 diabetes mellitus with hyperglycemia: Secondary | ICD-10-CM

## 2023-06-16 NOTE — Progress Notes (Signed)
   Subjective:    Patient ID: Roger Phillips, male    DOB: 05/03/1956, 67 y.o.   MRN: [id number]  HPI Pt comes in today for 5 month follow up on diabetes.Pt does not think allergy medication and would like to try something new. Pt also states he had a dizzy spell last week and it felt like blood pressure dropped. This patient finds it very difficult to control his diabetes He does not always do a great job of utilizing his insulin He does take his pills He states he feels better when his sugars stay in the 200 range Unfortunately this puts him at high risk of strokes heart attack and other issues He still smokes he has been counseled to quit but he does not have a strong desire to quit Overall he is a very nice guy  Review of Systems     Objective:   Physical Exam General-in no acute distress Eyes-no discharge Lungs-respiratory rate normal, CTA CV-no murmurs,RRR Extremities skin warm dry no edema Neuro grossly normal Behavior normal, alert        Assessment & Plan:  1. Immunization due Today - Flu Vaccine Trivalent High Dose (Fluad)  2. Primary hypertension Blood pressure is actually low when I checked it I recommend stopping amlodipine continue Cozaar  3. Hyperlipidemia associated with type 2 diabetes mellitus (HCC) Check lab work has already been ordered he just never got it done he has been encouraged to get it done continue medicine  4. Uncontrolled type 2 diabetes mellitus with hyperglycemia (HCC) He has been encouraged to take his insulin but he is just not going to I do not feel he would be a good candidate for GLP-1 healthy diet recommended check lab work  5. Atherosclerosis of native artery of right lower extremity with intermittent claudication (HCC) Doing fairly well currently  6. Other emphysema (HCC) Still smokes encouraged to quit  7.  Prostate cancer Followed by specialist

## 2023-06-24 ENCOUNTER — Ambulatory Visit: Payer: PPO | Admitting: Urology

## 2023-06-29 ENCOUNTER — Other Ambulatory Visit: Payer: Self-pay | Admitting: Family Medicine

## 2023-07-27 ENCOUNTER — Encounter (INDEPENDENT_AMBULATORY_CARE_PROVIDER_SITE_OTHER): Payer: Self-pay | Admitting: *Deleted

## 2023-07-31 ENCOUNTER — Telehealth: Payer: Self-pay | Admitting: Family Medicine

## 2023-07-31 MED ORDER — HYDROCHLOROTHIAZIDE 25 MG PO TABS: ORAL_TABLET | ORAL | 0 refills | Status: DC

## 2023-07-31 MED ORDER — LOSARTAN POTASSIUM 50 MG PO TABS: ORAL_TABLET | ORAL | 0 refills | Status: DC

## 2023-07-31 NOTE — Telephone Encounter (Signed)
Received via fax Rx request: Prescription sent electronically to pharmacy  

## 2023-07-31 NOTE — Telephone Encounter (Signed)
Refill on  hydrochlorothiazide (HYDRODIURIL) 25 MG tablet ,  losartan (COZAAR) 50 MG tablet send to [facility]

## 2023-08-05 ENCOUNTER — Telehealth: Payer: Self-pay | Admitting: Family Medicine

## 2023-08-05 MED ORDER — GLIPIZIDE 10 MG PO TABS: ORAL_TABLET | ORAL | 1 refills | Status: DC

## 2023-08-05 NOTE — Telephone Encounter (Signed)
Refill on  glipiZIDE (GLUCOTROL) 10 MG tablet send to [facility]

## 2023-08-21 ENCOUNTER — Ambulatory Visit: Payer: PPO | Admitting: Urology

## 2023-08-21 ENCOUNTER — Ambulatory Visit (HOSPITAL_COMMUNITY)
Admission: RE | Admit: 2023-08-21 | Discharge: 2023-08-21 | Disposition: A | Discharge status: Home / Self Care | Payer: PPO | Source: Ambulatory Visit | Attending: Urology | Admitting: Urology

## 2023-08-21 VITALS — BP 124/73 | HR 73

## 2023-08-21 DIAGNOSIS — K8020 Calculus of gallbladder without cholecystitis without obstruction: Secondary | ICD-10-CM | POA: Diagnosis not present

## 2023-08-21 DIAGNOSIS — R351 Nocturia: Secondary | ICD-10-CM

## 2023-08-21 DIAGNOSIS — N401 Enlarged prostate with lower urinary tract symptoms: Secondary | ICD-10-CM | POA: Diagnosis not present

## 2023-08-21 DIAGNOSIS — N138 Other obstructive and reflux uropathy: Secondary | ICD-10-CM | POA: Diagnosis not present

## 2023-08-21 DIAGNOSIS — N35011 Post-traumatic bulbous urethral stricture: Secondary | ICD-10-CM

## 2023-08-21 DIAGNOSIS — N200 Calculus of kidney: Secondary | ICD-10-CM | POA: Insufficient documentation

## 2023-08-21 DIAGNOSIS — I878 Other specified disorders of veins: Secondary | ICD-10-CM | POA: Diagnosis not present

## 2023-08-21 LAB — BLADDER SCAN AMB NON-IMAGING: Scan Result: 19

## 2023-08-21 NOTE — Progress Notes (Unsigned)
08/21/2023 12:43 PM   Roger Phillips 1955/12/15 [id number]  Referring provider: Babs Sciara, MD [address] [place],  [place] [address]  Followup difficulty urinating   HPI: Roger Phillips is a 67yo here for followup for BPH with nocturia, urethral stricture and nephrolithiasis. He had right flank pain 2-3 weeks ago which then subsided. IPSS 19 QOL 5. He tried gemtesa which failed to improve his LUTS and he had to strain to urinate. The urine stream remains strong. He is having to double void to empty his bladder. He had intermittent burning with urination during the day which then resolves at night.    PMH: Past Medical History:  Diagnosis Date   Arthritis    neck and left shoulder   Asthma    Back pain    buldging disc   Complication of anesthesia    difficult to wake up after neck surgery   COPD (chronic obstructive pulmonary disease) (HCC)    Depression    takes Wellbutrin   Diabetes mellitus    takes Metformin bid   Emphysema    GERD (gastroesophageal reflux disease)    takes Omeprazole daily   Hemorrhoids    High cholesterol    takes Pravastatin daily   HTN (hypertension)    takes Lisinopril,Norvasc,and HCTZ daily   IBS (irritable bowel syndrome)    Insomnia    doesn't take any meds    Pneumonia    hx of in 80's and 90's   Prostate cancer (HCC)    Sinus headache    sinus drainage    Surgical History: Past Surgical History:  Procedure Laterality Date   ANTERIOR CERVICAL DECOMP/DISCECTOMY FUSION  12/04/2011   Procedure: ANTERIOR CERVICAL DECOMPRESSION/DISCECTOMY FUSION 2 LEVELS;  Surgeon: Hewitt Shorts, MD;  Location: [facility];  Service: Neurosurgery;  Laterality: N/A;  Cervical Three-Four, Cervical Four-Five anterior cervial decompression with fusion plating and bonegraft   CARPAL TUNNEL RELEASE  10+yrs   right side   COLONOSCOPY N/A 04/19/2014   Procedure: COLONOSCOPY;  Surgeon: Malissa Hippo, MD;  Location: [facility];  Service:  Endoscopy;  Laterality: N/A;  1030-moved to 1230 Ann to notify pt   CYSTECTOMY  12-36yrs ago   from left side of neck   CYSTOSCOPY WITH URETHRAL DILATATION N/A 09/22/2022   Procedure: CYSTOSCOPY WITH URETHRAL DILATATION-optilume;  Surgeon: Malen Gauze, MD;  Location: [facility];  Service: Urology;  Laterality: N/A;   GOLD SEED IMPLANT N/A 12/06/2020   Procedure: GOLD SEED IMPLANT;  Surgeon: Malen Gauze, MD;  Location: [facility];  Service: Urology;  Laterality: N/A;   SPACE OAR INSTILLATION N/A 12/06/2020   Procedure: SPACE OAR INSTILLATION;  Surgeon: Malen Gauze, MD;  Location: [facility];  Service: Urology;  Laterality: N/A;    Home Medications:  Allergies as of 08/21/2023       Reactions   Doxycycline Diarrhea   Morphine And Codeine Other (See Comments)   Insomnia   Sulfa Antibiotics Rash        Medication List        Accurate as of August 21, 2023 12:43 PM. If you have any questions, ask your nurse or doctor.          STOP taking these medications    alfuzosin 10 MG 24 hr tablet Commonly known as: UROXATRAL   Gemtesa 75 MG Tabs Generic drug: Vibegron   nitrofurantoin (macrocrystal-monohydrate) 100 MG capsule Commonly known as: MACROBID   phenazopyridine 100 MG  tablet Commonly known as: Pyridium   sildenafil 20 MG tablet Commonly known as: REVATIO   Uribel 118 MG Caps       TAKE these medications    Accu-Chek Aviva Plus w/Device Kit   Accu-Chek Aviva Soln   Accu-Chek Softclix Lancets lancets   freestyle lancets Use to check blood sugar TID   albuterol 108 (90 Base) MCG/ACT inhaler Commonly known as: VENTOLIN HFA 2 puff q 4 prn   B-D SINGLE USE SWABS REGULAR Pads   cetirizine 10 MG tablet Commonly known as: ZYRTEC TAKE ONE TABLET BY MOUTH AT BEDTIME.   diphenoxylate-atropine 2.5-0.025 MG tablet Commonly known as: LOMOTIL 1 bid prn diarrhea due to radiation   fluticasone-salmeterol 250-50 MCG/ACT Aepb Commonly known as:  Advair Diskus Inhale one puff po BID   FREESTYLE LITE test strip Generic drug: glucose blood USE TO TEST BLOOD GLUCOSE THREE TIMES DAILY   glipiZIDE 10 MG tablet Commonly known as: GLUCOTROL TAKE ONE  TABLET BY MOUTH TWICE A DAY   hydrochlorothiazide 25 MG tablet Commonly known as: HYDRODIURIL TAKE 1 TABLET BY MOUTH DAILY.   insulin aspart 100 UNIT/ML FlexPen Commonly known as: NOVOLOG 8 units with lunch and dinner -may titrate up to 16 units   Lantus SoloStar 100 UNIT/ML Solostar Pen Generic drug: insulin glargine 40 units qhs. May titrate up to 60 units   losartan 50 MG tablet Commonly known as: COZAAR TAKE 1 TABLET BY MOUTH DAILY.   omeprazole 20 MG capsule Commonly known as: PRILOSEC TAKE 1 TABLET BY MOUTH TWICE DAILY FOR GERD.   pravastatin 40 MG tablet Commonly known as: PRAVACHOL Take 1 tablet (40 mg total) by mouth daily.   sucralfate 1 g tablet Commonly known as: CARAFATE TAKE ONE (1) TABLET BY MOUTH 3 TIMES DAILY FOR GERD.        Allergies:  Allergies  Allergen Reactions   Doxycycline Diarrhea   Morphine And Codeine Other (See Comments)    Insomnia    Sulfa Antibiotics Rash    Family History: Family History  Problem Relation Age of Onset   Heart disease Father    Hypertension Father    Heart disease Brother    Anesthesia problems Neg Hx    Hypotension Neg Hx    Malignant hyperthermia Neg Hx    Pseudochol deficiency Neg Hx    Breast cancer Neg Hx    Prostate cancer Neg Hx    Colon cancer Neg Hx    Pancreatic cancer Neg Hx     Social History:  reports that he has been smoking cigarettes. He has a 60 pack-year smoking history. He has never used smokeless tobacco. He reports current alcohol use. He reports that he does not use drugs.  ROS: All other review of systems were reviewed and are negative except what is noted above in HPI  Physical Exam: BP 124/73   Pulse 73   Constitutional:  Alert and oriented, No acute distress. HEENT: [place]  AT, moist mucus membranes.  Trachea midline, no masses. Cardiovascular: No clubbing, cyanosis, or edema. Respiratory: Normal respiratory effort, no increased work of breathing. GI: Abdomen is soft, nontender, nondistended, no abdominal masses GU: No CVA tenderness.  Lymph: No cervical or inguinal lymphadenopathy. Skin: No rashes, bruises or suspicious lesions. Neurologic: Grossly intact, no focal deficits, moving all 4 extremities. Psychiatric: Normal mood and affect.  Laboratory Data: Lab Results  Component Value Date   WBC 6.9 03/30/2013   HGB 16.4 03/30/2013   HCT 45.8 03/30/2013  MCV 85.3 03/30/2013   PLT 247 03/30/2013    Lab Results  Component Value Date   CREATININE 1.12 09/08/2022    Lab Results  Component Value Date   PSA 3.68 07/07/2013    No results found for: "TESTOSTERONE"  Lab Results  Component Value Date   HGBA1C 8.6 (H) 09/08/2022    Urinalysis    Component Value Date/Time   APPEARANCEUR Clear 11/26/2022 1118   GLUCOSEU Negative 11/26/2022 1118   BILIRUBINUR Negative 11/26/2022 1118   PROTEINUR 1+ (A) 11/26/2022 1118   NITRITE Negative 11/26/2022 1118   LEUKOCYTESUR Negative 11/26/2022 1118    Lab Results  Component Value Date   LABMICR See below: 11/26/2022   WBCUA 0-5 11/26/2022   LABEPIT 0-10 11/26/2022   MUCUS Present 05/28/2022   BACTERIA None seen 11/26/2022    Pertinent Imaging: *** No results found for this or any previous visit.  No results found for this or any previous visit.  No results found for this or any previous visit.  No results found for this or any previous visit.  No results found for this or any previous visit.  No valid procedures specified. No results found for this or any previous visit.  Results for orders placed during the hospital encounter of 04/21/22  CT RENAL STONE STUDY  Narrative CLINICAL DATA:  Nephrolithiasis.  Dysuria.  EXAM: CT ABDOMEN AND PELVIS WITHOUT  CONTRAST  TECHNIQUE: Multidetector CT imaging of the abdomen and pelvis was performed following the standard protocol without IV contrast.  RADIATION DOSE REDUCTION: This exam was performed according to the departmental dose-optimization program which includes automated exposure control, adjustment of the mA and/or kV according to patient size and/or use of iterative reconstruction technique.  COMPARISON:  Overlapping portions CT chest 07/28/2018  FINDINGS: Lower chest: Lingular bulla, image 6 series 4. Mild scarring or atelectasis in the right lower lobe. Descending thoracic aortic atherosclerosis and circumflex coronary artery atherosclerosis.  Hepatobiliary: Multiple gallstones measuring up to 0.7 cm in diameter. No biliary dilatation. Otherwise unremarkable.  Pancreas: Unremarkable  Spleen: Unremarkable  Adrenals/Urinary Tract: Both adrenal glands appear normal.  There are proximally 8 nonobstructive right renal calculi, one of the largest in the right kidney lower pole measures 0.8 cm in long axis.  Two left kidney lower pole calculi are identified, the larger measuring 0.5 cm in short axis.  No hydronephrosis or hydroureter. No ureteral or bladder calculus identified. Wall thickening in the urinary bladder is least partially from nondistention but there could certainly be a component of cystitis.  Stomach/Bowel: Unremarkable  Vascular/Lymphatic: Atherosclerosis is present, including aortoiliac atherosclerotic disease. No pathologic adenopathy observed.  Reproductive: Prostatomegaly. Fiducials noted along the prostate margins.  Other: No supplemental non-categorized findings.  Musculoskeletal: Mild bridging spurring anteriorly in the sacroiliac joints. Mild bilateral degenerative hip arthropathy. Lumbar spondylosis and degenerative disc disease causing impingement especially at L4-5.  IMPRESSION: 1. Bilateral nonobstructive renal calculi. No  hydronephrosis, hydroureter, or ureteral/bladder calculus. 2. Bladder wall thickening may partially be due to nondistention but superimposed cystitis is not excluded. 3. Prostatomegaly with fiducials in place. 4. Other imaging findings of potential clinical significance: Aortic Atherosclerosis (ICD10-I70.0). Coronary atherosclerosis. Cholelithiasis. Mild bilateral degenerative hip arthropathy. Lumbar impingement especially at L4-5.   Electronically Signed By: Gaylyn Rong M.D. On: 04/22/2022 14:52   Assessment & Plan:    1. Benign prostatic hyperplasia with urinary obstruction - - BLADDER SCAN AMB NON-IMAGING  2. Post-traumatic bulbous urethral stricture Followup 6 months with flow PVR.  3. Nocturia ***  4. nephrolithiasis -KUB, will call with results   No follow-ups on file.  Wilkie Aye, MD  [facility] [place]

## 2023-08-21 NOTE — Progress Notes (Unsigned)
post void residual=19

## 2023-08-25 ENCOUNTER — Other Ambulatory Visit: Payer: Self-pay | Admitting: Family Medicine

## 2023-08-25 ENCOUNTER — Encounter: Payer: Self-pay | Admitting: Urology

## 2023-08-25 NOTE — Patient Instructions (Signed)

## 2023-08-27 ENCOUNTER — Encounter: Payer: Self-pay | Admitting: Urology

## 2023-08-27 ENCOUNTER — Ambulatory Visit (INDEPENDENT_AMBULATORY_CARE_PROVIDER_SITE_OTHER): Payer: PPO | Admitting: Family Medicine

## 2023-08-27 VITALS — BP 110/62 | HR 73 | Temp 97.7°F | Ht 72.0 in | Wt 207.0 lb

## 2023-08-27 DIAGNOSIS — R109 Unspecified abdominal pain: Secondary | ICD-10-CM

## 2023-08-27 DIAGNOSIS — H6090 Unspecified otitis externa, unspecified ear: Secondary | ICD-10-CM | POA: Insufficient documentation

## 2023-08-27 DIAGNOSIS — N200 Calculus of kidney: Secondary | ICD-10-CM

## 2023-08-27 DIAGNOSIS — H60501 Unspecified acute noninfective otitis externa, right ear: Secondary | ICD-10-CM

## 2023-08-27 MED ORDER — CIPROFLOXACIN-DEXAMETHASONE 0.3-0.1 % OT SUSP: 4.0000 [drp] | Freq: Two times a day (BID) | OTIC | 0 refills | Status: AC

## 2023-08-27 NOTE — Progress Notes (Signed)
Subjective:  Patient ID: Roger Phillips, male    DOB: [birth year]  Age: 67 y.o. MRN: [id number]  CC:   Chief Complaint  Patient presents with   right outer ear swelling    HPI:  67 year old male presents for evaluation of the above.   Right ear pain and swelling around the canal for the past few days. It has improved but it is still mildly troublesome. No fever. No upper respiratory symptoms. No other complaints.  Patient Active Problem List   Diagnosis Date Noted   Otitis externa 08/27/2023   Atherosclerosis of native artery of right lower extremity with intermittent claudication (HCC) 06/16/2023   Benign prostatic hyperplasia with urinary obstruction 10/08/2022   Hyperlipidemia associated with type 2 diabetes mellitus (HCC) 01/01/2021   Prostate cancer (HCC) 10/10/2020   Elevated PSA 08/29/2020   Obesity, unspecified 04/28/2014   Type 2 diabetes mellitus, uncontrolled 03/28/2013   CAD (coronary artery disease) 04/12/2012   Hyperlipidemia 04/12/2012   Hypertension 04/12/2012   COPD (chronic obstructive pulmonary disease) (HCC) 07/04/2011   Pulmonary nodule 05/09/2011   Hemoptysis 05/09/2011   Tobacco abuse 05/09/2011    Social Hx   Social History   Socioeconomic History   Marital status: Married    Spouse name: Not on file   Number of children: 2   Years of education: Not on file   Highest education level: 9th grade  Occupational History   Occupation: retired  Tobacco Use   Smoking status: Every Day    Current packs/day: 1.50    Average packs/day: 1.5 packs/day for 40.0 years (60.0 ttl pk-yrs)    Types: Cigarettes   Smokeless tobacco: Never  Vaping Use   Vaping status: Never Used  Substance and Sexual Activity   Alcohol use: Yes   Drug use: No   Sexual activity: Yes  Other Topics Concern   Not on file  Social History Narrative   Not on file   Social Determinants of Health   Financial Resource Strain: Medium Risk (06/15/2023)   Overall Financial  Resource Strain (CARDIA)    Difficulty of Paying Living Expenses: Somewhat hard  Food Insecurity: No Food Insecurity (08/25/2023)   Hunger Vital Sign    Worried About Running Out of Food in the Last Year: Never true    Ran Out of Food in the Last Year: Never true  Recent Concern: Food Insecurity - Food Insecurity Present (06/15/2023)   Hunger Vital Sign    Worried About Radiation protection practitioner of Food in the Last Year: Sometimes true    Ran Out of Food in the Last Year: Never true  Transportation Needs: Unknown (08/25/2023)   PRAPARE - Administrator, Civil Service (Medical): Not on file    Lack of Transportation (Non-Medical): No  Physical Activity: Unknown (08/25/2023)   Exercise Vital Sign    Days of Exercise per Week: 0 days    Minutes of Exercise per Session: Not on file  Stress: No Stress Concern Present (08/25/2023)   [employer] of Occupational Health - Occupational Stress Questionnaire    Feeling of Stress : Not at all  Social Connections: Moderately Isolated (08/25/2023)   Social Connection and Isolation Panel [NHANES]    Frequency of Communication with Friends and Family: More than three times a week    Frequency of Social Gatherings with Friends and Family: More than three times a week    Attends Religious Services: Never    Database administrator or Organizations:  No    Attends Banker Meetings: Not on file    Marital Status: Married    Review of Systems Per HPI  Objective:  BP 110/62   Pulse 73   Temp 97.7 F (36.5 C)   Ht 6' (1.829 m)   Wt 207 lb (93.9 kg)   SpO2 97%   BMI 28.07 kg/m      08/27/2023    3:11 PM 08/21/2023   12:25 PM 06/16/2023    1:39 PM  BP/Weight  Systolic BP 110 124 115  Diastolic BP 62 73 74  Wt. (Lbs) 207  204.6  BMI 28.07 kg/m2  27.75 kg/m2    Physical Exam Vitals and nursing note reviewed.  Constitutional:      General: He is not in acute distress.    Appearance: Normal appearance.  HENT:     Head:  Normocephalic and atraumatic.     Ears:     Comments: Right ear - erythema of the canal. Small amount of debri.  Neurological:     Mental Status: He is alert.     Lab Results  Component Value Date   WBC 6.9 03/30/2013   HGB 16.4 03/30/2013   HCT 45.8 03/30/2013   PLT 247 03/30/2013   GLUCOSE 227 (H) 09/08/2022   CHOL 147 07/16/2021   TRIG 164 (H) 07/16/2021   HDL 35 (L) 07/16/2021   LDLCALC 84 07/16/2021   ALT 11 09/08/2022   AST 11 09/08/2022   NA 137 09/08/2022   K 3.9 09/08/2022   CL 95 (L) 09/08/2022   CREATININE 1.12 09/08/2022   BUN 25 09/08/2022   CO2 25 09/08/2022   PSA 3.68 07/07/2013   HGBA1C 8.6 (H) 09/08/2022   MICROALBUR 2.84 (H) 06/15/2014     Assessment & Plan:   Problem List Items Addressed This Visit       Nervous and Auditory   Otitis externa - Primary    Ciprodex as prescribed.        Meds ordered this encounter  Medications   ciprofloxacin-dexamethasone (CIPRODEX) OTIC suspension    Sig: Place 4 drops into the right ear 2 (two) times daily for 7 days.    Dispense:  7.5 mL    Refill:  0    Follow-up:  Return if symptoms worsen or fail to improve.  Everlene Other DO [facility]

## 2023-08-27 NOTE — Assessment & Plan Note (Signed)
Ciprodex as prescribed.

## 2023-09-02 MED ORDER — ALFUZOSIN HCL ER 10 MG PO TB24: 10.0000 mg | ORAL_TABLET | Freq: Every day | ORAL | 1 refills | Status: DC

## 2023-10-02 ENCOUNTER — Telehealth: Payer: Self-pay | Admitting: Family Medicine

## 2023-10-02 MED ORDER — OMEPRAZOLE 20 MG PO CPDR: DELAYED_RELEASE_CAPSULE | ORAL | 0 refills | Status: DC

## 2023-10-02 NOTE — Telephone Encounter (Signed)
Refill on  omeprazole 20 mg send to [facility]

## 2023-10-02 NOTE — Telephone Encounter (Signed)
Received via fax Rx request: Prescription sent electronically to pharmacy  

## 2023-10-19 ENCOUNTER — Other Ambulatory Visit: Payer: Self-pay | Admitting: Family Medicine

## 2023-10-21 ENCOUNTER — Encounter: Payer: Self-pay | Admitting: Urology

## 2023-10-23 MED ORDER — PHENAZOPYRIDINE HCL 200 MG PO TABS: 200.0000 mg | ORAL_TABLET | Freq: Two times a day (BID) | ORAL | 0 refills | Status: DC

## 2023-10-26 ENCOUNTER — Other Ambulatory Visit: Payer: Self-pay | Admitting: Family Medicine

## 2023-10-26 DIAGNOSIS — I10 Essential (primary) hypertension: Secondary | ICD-10-CM

## 2023-11-03 ENCOUNTER — Other Ambulatory Visit: Payer: Self-pay | Admitting: Family Medicine

## 2023-11-28 ENCOUNTER — Other Ambulatory Visit: Payer: Self-pay

## 2023-11-28 ENCOUNTER — Emergency Department (HOSPITAL_COMMUNITY)
Admission: EM | Admit: 2023-11-28 | Discharge: 2023-11-29 | Disposition: A | Discharge status: Home / Self Care | Payer: PPO | Attending: Emergency Medicine | Admitting: Emergency Medicine

## 2023-11-28 ENCOUNTER — Emergency Department (HOSPITAL_COMMUNITY): Payer: PPO

## 2023-11-28 ENCOUNTER — Encounter (HOSPITAL_COMMUNITY): Payer: Self-pay

## 2023-11-28 DIAGNOSIS — R519 Headache, unspecified: Secondary | ICD-10-CM | POA: Diagnosis not present

## 2023-11-28 DIAGNOSIS — I6529 Occlusion and stenosis of unspecified carotid artery: Secondary | ICD-10-CM | POA: Diagnosis not present

## 2023-11-28 DIAGNOSIS — I700 Atherosclerosis of aorta: Secondary | ICD-10-CM | POA: Diagnosis not present

## 2023-11-28 DIAGNOSIS — Z79899 Other long term (current) drug therapy: Secondary | ICD-10-CM | POA: Diagnosis not present

## 2023-11-28 DIAGNOSIS — J449 Chronic obstructive pulmonary disease, unspecified: Secondary | ICD-10-CM | POA: Insufficient documentation

## 2023-11-28 DIAGNOSIS — Z981 Arthrodesis status: Secondary | ICD-10-CM | POA: Diagnosis not present

## 2023-11-28 DIAGNOSIS — Z794 Long term (current) use of insulin: Secondary | ICD-10-CM | POA: Insufficient documentation

## 2023-11-28 DIAGNOSIS — I10 Essential (primary) hypertension: Secondary | ICD-10-CM | POA: Insufficient documentation

## 2023-11-28 DIAGNOSIS — R9082 White matter disease, unspecified: Secondary | ICD-10-CM | POA: Diagnosis not present

## 2023-11-28 DIAGNOSIS — H5789 Other specified disorders of eye and adnexa: Secondary | ICD-10-CM | POA: Diagnosis not present

## 2023-11-28 LAB — CBG MONITORING, ED: Glucose-Capillary: 175 mg/dL — ABNORMAL HIGH (ref 70–99)

## 2023-11-28 MED ORDER — PROCHLORPERAZINE EDISYLATE 10 MG/2ML IJ SOLN
10.0000 mg | Freq: Once | INTRAMUSCULAR | Status: AC
  Administered 2023-11-29: 10 mg via INTRAVENOUS
  Filled 2023-11-28: qty 2

## 2023-11-28 MED ORDER — OXYCODONE-ACETAMINOPHEN 5-325 MG PO TABS: 1.0000 | ORAL_TABLET | Freq: Four times a day (QID) | ORAL | 0 refills | Status: DC | PRN

## 2023-11-28 MED ORDER — HYDROMORPHONE HCL 1 MG/ML IJ SOLN: 1.0000 mg | Freq: Once | INTRAMUSCULAR | Status: DC

## 2023-11-28 MED ORDER — KETOROLAC TROMETHAMINE 30 MG/ML IJ SOLN
30.0000 mg | Freq: Once | INTRAMUSCULAR | Status: AC
  Administered 2023-11-28: 30 mg via INTRAMUSCULAR
  Filled 2023-11-28: qty 1

## 2023-11-28 MED ORDER — OXYCODONE-ACETAMINOPHEN 5-325 MG PO TABS
1.0000 | ORAL_TABLET | Freq: Once | ORAL | Status: AC
  Administered 2023-11-28: 1 via ORAL
  Filled 2023-11-28: qty 1

## 2023-11-28 MED ORDER — DIPHENHYDRAMINE HCL 50 MG/ML IJ SOLN
12.5000 mg | Freq: Once | INTRAMUSCULAR | Status: AC
  Administered 2023-11-29: 12.5 mg via INTRAVENOUS
  Filled 2023-11-28: qty 1

## 2023-11-28 NOTE — ED Triage Notes (Signed)
 Pt to ED from home with c/o watery eyes and headache and congestion for a couple of days.

## 2023-11-28 NOTE — Discharge Instructions (Signed)
 Follow-up with Dr. Nada Libman the eye doctor this week.  Also follow-up with your family doctor if not improving  Your CTA was negative for aneurysm.

## 2023-11-28 NOTE — ED Provider Notes (Signed)
 [place] [facility] EMERGENCY DEPARTMENT AT [facility] Provider Note   CSN: [id number] Arrival date & time: 11/28/23  2049     History {Add pertinent medical, surgical, social history, OB history to HPI:1} Chief Complaint  Patient presents with   Headache    Roger Phillips is a 68 y.o. male.  Recent history of COPD and hypertension.  He complains of a headache for couple days and drainage from his eye.  Patient complains of sinus problems  The history is provided by the patient and medical records. No language interpreter was used.  Headache Pain location:  R temporal Quality:  Dull Radiates to:  Does not radiate Severity currently:  6/10 Severity at highest:  7/10 Onset quality:  Sudden Timing:  Constant Progression:  Waxing and waning Chronicity:  New Similar to prior headaches: no   Context: not activity   Relieved by:  Nothing Worsened by:  Nothing Associated symptoms: no abdominal pain, no back pain, no congestion, no cough, no diarrhea, no fatigue, no seizures and no sinus pressure        Home Medications Prior to Admission medications   Medication Sig Start Date End Date Taking? Authorizing Provider  oxyCODONE-acetaminophen (PERCOCET/ROXICET) 5-325 MG tablet Take 1 tablet by mouth every 6 (six) hours as needed for severe pain (pain score 7-10). 11/28/23  Yes Bethann Berkshire, MD  ACCU-CHEK [facility] LANCETS lancets  11/15/13   [provider]  albuterol (VENTOLIN HFA) 108 (90 Base) MCG/ACT inhaler 2 puff q 4 prn 01/14/23   Babs Sciara, MD  Alcohol Swabs (B-D SINGLE USE SWABS REGULAR) PADS  11/15/13   [provider]  alfuzosin (UROXATRAL) 10 MG 24 hr tablet Take 1 tablet (10 mg total) by mouth daily with breakfast. 09/02/23   McKenzie, Mardene Celeste, MD  Blood Glucose Calibration (ACCU-CHEK AVIVA) SOLN  11/15/13   [provider]  Blood Glucose Monitoring Suppl (ACCU-CHEK AVIVA PLUS) W/DEVICE KIT  11/15/13   [provider]   cetirizine (ZYRTEC) 10 MG tablet TAKE ONE TABLET BY MOUTH AT BEDTIME. 01/14/23   Luking, Jonna Coup, MD  diphenoxylate-atropine (LOMOTIL) 2.5-0.025 MG tablet TAKE ONE TABLET BY MOUTH TWICE A DAY AS NEEDED FOR DIARRHEA DUE TO PROSTATE RADIATION. 08/27/23   Babs Sciara, MD  FREESTYLE LITE test strip USE TO TEST BLOOD GLUCOSE THREE TIMES DAILY 11/07/20   Babs Sciara, MD  glipiZIDE (GLUCOTROL) 10 MG tablet TAKE ONE  TABLET BY MOUTH TWICE A DAY 08/05/23   Luking, Scott A, MD  hydrochlorothiazide (HYDRODIURIL) 25 MG tablet TAKE 1 TABLET BY MOUTH DAILY. 10/19/23   Babs Sciara, MD  insulin aspart (NOVOLOG) 100 UNIT/ML FlexPen 8 units with lunch and dinner -may titrate up to 16 units 09/10/21   Luking, Scott A, MD  insulin glargine (LANTUS SOLOSTAR) 100 UNIT/ML Solostar Pen 40 units qhs. May titrate up to 60 units 09/10/21   Babs Sciara, MD  Lancets (FREESTYLE) lancets Use to check blood sugar TID 11/08/20   Babs Sciara, MD  losartan (COZAAR) 50 MG tablet TAKE 1 TABLET BY MOUTH DAILY. 10/26/23   Babs Sciara, MD  omeprazole (PRILOSEC) 20 MG capsule TAKE 1 TABLET BY MOUTH TWICE DAILY FOR GERD. 10/02/23   Babs Sciara, MD  phenazopyridine (PYRIDIUM) 200 MG tablet Take 1 tablet (200 mg total) by mouth 2 (two) times daily. 10/23/23   McKenzie, Mardene Celeste, MD  pravastatin (PRAVACHOL) 40 MG tablet Take 1 tablet (40 mg total) by mouth daily.  01/14/23   Babs Sciara, MD  sucralfate (CARAFATE) 1 g tablet TAKE ONE (1) TABLET BY MOUTH 3 TIMES DAILY FOR GERD. 01/27/22   Luking, Jonna Coup, MD  WIXELA INHUB 250-50 MCG/ACT AEPB INHALE ONE PUFF BY MOUTH TWICE DAILY. 11/03/23   Babs Sciara, MD      Allergies    Doxycycline, Morphine and codeine, and Sulfa antibiotics    Review of Systems   Review of Systems  Constitutional:  Negative for appetite change and fatigue.  HENT:  Negative for congestion, ear discharge and sinus pressure.   Eyes:  Negative for discharge.  Respiratory:  Negative for cough.    Cardiovascular:  Negative for chest pain.  Gastrointestinal:  Negative for abdominal pain and diarrhea.  Genitourinary:  Negative for frequency and hematuria.  Musculoskeletal:  Negative for back pain.  Skin:  Negative for rash.  Neurological:  Positive for headaches. Negative for seizures.  Psychiatric/Behavioral:  Negative for hallucinations.     Physical Exam Updated Vital Signs BP 123/67 (BP Location: Right Arm)   Pulse 64   Temp 97.7 F (36.5 C) (Oral)   Resp 16   Ht 6' (1.829 m)   Wt 93.9 kg   SpO2 98%   BMI 28.08 kg/m  Physical Exam Vitals and nursing note reviewed.  Constitutional:      Appearance: He is well-developed.  HENT:     Head: Normocephalic.     Nose: Nose normal.     Mouth/Throat:     Mouth: Mucous membranes are moist.  Eyes:     General: No scleral icterus.    Extraocular Movements: Extraocular movements intact.     Pupils: Pupils are equal, round, and reactive to light.     Comments: Conjunctiva mildly inflamed.   No decreased vision  Neck:     Thyroid: No thyromegaly.  Cardiovascular:     Rate and Rhythm: Normal rate and regular rhythm.     Heart sounds: No murmur heard.    No friction rub. No gallop.  Pulmonary:     Breath sounds: No stridor. No wheezing or rales.  Chest:     Chest wall: No tenderness.  Abdominal:     General: There is no distension.     Tenderness: There is no abdominal tenderness. There is no rebound.  Musculoskeletal:        General: Normal range of motion.     Cervical back: Neck supple.  Lymphadenopathy:     Cervical: No cervical adenopathy.  Skin:    Findings: No erythema or rash.  Neurological:     Mental Status: He is alert and oriented to person, place, and time.     Motor: No abnormal muscle tone.     Coordination: Coordination normal.  Psychiatric:        Behavior: Behavior normal.     ED Results / Procedures / Treatments   Labs (all labs ordered are listed, but only abnormal results are  displayed) Labs Reviewed  CBG MONITORING, ED - Abnormal; Notable for the following components:      Result Value   Glucose-Capillary 175 (*)    All other components within normal limits    EKG None  Radiology No results found.  Procedures Procedures  {Document cardiac monitor, telemetry assessment procedure when appropriate:1}  Medications Ordered in ED Medications  HYDROmorphone (DILAUDID) injection 1 mg (0 mg Intramuscular Hold 11/28/23 2259)  ketorolac (TORADOL) 30 MG/ML injection 30 mg (30 mg Intramuscular Given 11/28/23 2136)  oxyCODONE-acetaminophen (  PERCOCET/ROXICET) 5-325 MG per tablet 1 tablet (1 tablet Oral Given 11/28/23 2135)    ED Course/ Medical Decision Making/ A&P  Patient's headache improved with Toradol and Percocet.  Patient does not want to wait around for his CT of his head results.   CT scan showed approximately 1 cm diameter soft tissue isodense fullness in the right Region suspicious for right ICA aneurysm.  Patient will get a CT angio for evaluation {   Click here for ABCD2, HEART and other calculatorsREFRESH Note before signing :1}                              Medical Decision Making Amount and/or Complexity of Data Reviewed Labs: ordered. Radiology: ordered.  Risk Prescription drug management.   Patient with a headache and drainage from his right eye.  Patient getting a CT angio of his head for further evaluation.  {Document critical care time when appropriate:1} {Document review of labs and clinical decision tools ie heart score, Chads2Vasc2 etc:1}  {Document your independent review of radiology images, and any outside records:1} {Document your discussion with family members, caretakers, and with consultants:1} {Document social determinants of health affecting pt's care:1} {Document your decision making why or why not admission, treatments were needed:1} Final Clinical Impression(s) / ED Diagnoses Final diagnoses:  Bad headache    Rx / DC  Orders ED Discharge Orders          Ordered    oxyCODONE-acetaminophen (PERCOCET/ROXICET) 5-325 MG tablet  Every 6 hours PRN        11/28/23 2308

## 2023-11-28 NOTE — ED Notes (Signed)
Patient provided with warm blankets for comfort 

## 2023-11-29 ENCOUNTER — Emergency Department (HOSPITAL_COMMUNITY): Payer: PPO

## 2023-11-29 DIAGNOSIS — I6529 Occlusion and stenosis of unspecified carotid artery: Secondary | ICD-10-CM | POA: Diagnosis not present

## 2023-11-29 DIAGNOSIS — Z981 Arthrodesis status: Secondary | ICD-10-CM | POA: Diagnosis not present

## 2023-11-29 DIAGNOSIS — R519 Headache, unspecified: Secondary | ICD-10-CM | POA: Diagnosis not present

## 2023-11-29 DIAGNOSIS — I700 Atherosclerosis of aorta: Secondary | ICD-10-CM | POA: Diagnosis not present

## 2023-11-29 LAB — CBC WITH DIFFERENTIAL/PLATELET
Abs Immature Granulocytes: 0.06 10*3/uL (ref 0.00–0.07)
Basophils Absolute: 0.1 10*3/uL (ref 0.0–0.1)
Basophils Relative: 2 %
Eosinophils Absolute: 0.4 10*3/uL (ref 0.0–0.5)
Eosinophils Relative: 7 %
HCT: 41.5 % (ref 39.0–52.0)
Hemoglobin: 14.6 g/dL (ref 13.0–17.0)
Immature Granulocytes: 1 %
Lymphocytes Relative: 21 %
Lymphs Abs: 1.3 10*3/uL (ref 0.7–4.0)
MCH: 31.5 pg (ref 26.0–34.0)
MCHC: 35.2 g/dL (ref 30.0–36.0)
MCV: 89.4 fL (ref 80.0–100.0)
Monocytes Absolute: 0.3 10*3/uL (ref 0.1–1.0)
Monocytes Relative: 6 %
Neutro Abs: 3.8 10*3/uL (ref 1.7–7.7)
Neutrophils Relative %: 63 %
Platelets: 227 10*3/uL (ref 150–400)
RBC: 4.64 MIL/uL (ref 4.22–5.81)
RDW: 14.1 % (ref 11.5–15.5)
WBC: 6 10*3/uL (ref 4.0–10.5)
nRBC: 0 % (ref 0.0–0.2)

## 2023-11-29 LAB — BASIC METABOLIC PANEL
Anion gap: 10 (ref 5–15)
BUN: 22 mg/dL (ref 8–23)
CO2: 24 mmol/L (ref 22–32)
Calcium: 9.1 mg/dL (ref 8.9–10.3)
Chloride: 97 mmol/L — ABNORMAL LOW (ref 98–111)
Creatinine, Ser: 1.44 mg/dL — ABNORMAL HIGH (ref 0.61–1.24)
GFR, Estimated: 53 mL/min — ABNORMAL LOW (ref >60)
Glucose, Bld: 188 mg/dL — ABNORMAL HIGH (ref 70–99)
Potassium: 3.6 mmol/L (ref 3.5–5.1)
Sodium: 131 mmol/L — ABNORMAL LOW (ref 135–145)

## 2023-11-29 MED ORDER — IOHEXOL 350 MG/ML SOLN
75.0000 mL | Freq: Once | INTRAVENOUS | Status: AC | PRN
  Administered 2023-11-29: 75 mL via INTRAVENOUS

## 2023-11-29 NOTE — ED Provider Notes (Signed)
 Patient signed out pending CTA head.  Incidentally noted to have an abnormality on CT concerning for possible aneurysm.  Patient was given a migraine cocktail.  Basic lab was obtained.  Slight AKI otherwise largely reassuring.  CTA without any evidence of acute aneurysm or abnormality.  Patient was updated.  Headache is almost gone.  Will have him follow-up with PCP.   Shon Baton, MD 11/29/23 0200

## 2023-11-30 ENCOUNTER — Ambulatory Visit: Payer: PPO | Admitting: Physician Assistant

## 2023-11-30 ENCOUNTER — Emergency Department (HOSPITAL_COMMUNITY): Payer: PPO

## 2023-11-30 ENCOUNTER — Other Ambulatory Visit: Payer: Self-pay

## 2023-11-30 ENCOUNTER — Encounter: Payer: Self-pay | Admitting: Family Medicine

## 2023-11-30 ENCOUNTER — Emergency Department (HOSPITAL_COMMUNITY)
Admission: EM | Admit: 2023-11-30 | Discharge: 2023-11-30 | Disposition: A | Discharge status: Home / Self Care | Payer: PPO | Attending: Emergency Medicine | Admitting: Emergency Medicine

## 2023-11-30 ENCOUNTER — Encounter (HOSPITAL_COMMUNITY): Payer: Self-pay

## 2023-11-30 DIAGNOSIS — Z8546 Personal history of malignant neoplasm of prostate: Secondary | ICD-10-CM | POA: Insufficient documentation

## 2023-11-30 DIAGNOSIS — I10 Essential (primary) hypertension: Secondary | ICD-10-CM | POA: Insufficient documentation

## 2023-11-30 DIAGNOSIS — H5789 Other specified disorders of eye and adnexa: Secondary | ICD-10-CM | POA: Diagnosis present

## 2023-11-30 DIAGNOSIS — E119 Type 2 diabetes mellitus without complications: Secondary | ICD-10-CM | POA: Diagnosis not present

## 2023-11-30 DIAGNOSIS — F17210 Nicotine dependence, cigarettes, uncomplicated: Secondary | ICD-10-CM | POA: Insufficient documentation

## 2023-11-30 DIAGNOSIS — J449 Chronic obstructive pulmonary disease, unspecified: Secondary | ICD-10-CM | POA: Insufficient documentation

## 2023-11-30 DIAGNOSIS — I2510 Atherosclerotic heart disease of native coronary artery without angina pectoris: Secondary | ICD-10-CM | POA: Diagnosis not present

## 2023-11-30 DIAGNOSIS — L03213 Periorbital cellulitis: Secondary | ICD-10-CM | POA: Insufficient documentation

## 2023-11-30 DIAGNOSIS — R220 Localized swelling, mass and lump, head: Secondary | ICD-10-CM | POA: Diagnosis not present

## 2023-11-30 DIAGNOSIS — E871 Hypo-osmolality and hyponatremia: Secondary | ICD-10-CM | POA: Diagnosis not present

## 2023-11-30 LAB — BASIC METABOLIC PANEL
Anion gap: 19 — ABNORMAL HIGH (ref 5–15)
BUN: 19 mg/dL (ref 8–23)
CO2: 19 mmol/L — ABNORMAL LOW (ref 22–32)
Calcium: 9.8 mg/dL (ref 8.9–10.3)
Chloride: 91 mmol/L — ABNORMAL LOW (ref 98–111)
Creatinine, Ser: 1.51 mg/dL — ABNORMAL HIGH (ref 0.61–1.24)
GFR, Estimated: 50 mL/min — ABNORMAL LOW (ref >60)
Glucose, Bld: 158 mg/dL — ABNORMAL HIGH (ref 70–99)
Potassium: 3.9 mmol/L (ref 3.5–5.1)
Sodium: 129 mmol/L — ABNORMAL LOW (ref 135–145)

## 2023-11-30 LAB — CBC
HCT: 45.6 % (ref 39.0–52.0)
Hemoglobin: 15.7 g/dL (ref 13.0–17.0)
MCH: 31.1 pg (ref 26.0–34.0)
MCHC: 34.4 g/dL (ref 30.0–36.0)
MCV: 90.3 fL (ref 80.0–100.0)
Platelets: 242 10*3/uL (ref 150–400)
RBC: 5.05 MIL/uL (ref 4.22–5.81)
RDW: 14 % (ref 11.5–15.5)
WBC: 10.7 10*3/uL — ABNORMAL HIGH (ref 4.0–10.5)
nRBC: 0 % (ref 0.0–0.2)

## 2023-11-30 MED ORDER — CLINDAMYCIN HCL 300 MG PO CAPS: 300.0000 mg | ORAL_CAPSULE | Freq: Three times a day (TID) | ORAL | 0 refills | Status: AC

## 2023-11-30 MED ORDER — AMOXICILLIN-POT CLAVULANATE 875-125 MG PO TABS: 1.0000 | ORAL_TABLET | Freq: Two times a day (BID) | ORAL | 0 refills | Status: AC

## 2023-11-30 MED ORDER — TETRACAINE HCL 0.5 % OP SOLN
1.0000 [drp] | Freq: Once | OPHTHALMIC | Status: AC
  Administered 2023-11-30: 1 [drp] via OPHTHALMIC
  Filled 2023-11-30: qty 4

## 2023-11-30 MED ORDER — PROCHLORPERAZINE EDISYLATE 10 MG/2ML IJ SOLN
10.0000 mg | Freq: Once | INTRAMUSCULAR | Status: AC
  Administered 2023-11-30: 10 mg via INTRAVENOUS
  Filled 2023-11-30: qty 2

## 2023-11-30 MED ORDER — LACTATED RINGERS IV BOLUS
500.0000 mL | Freq: Once | INTRAVENOUS | Status: AC
  Administered 2023-11-30: 500 mL via INTRAVENOUS

## 2023-11-30 MED ORDER — HYDROCODONE-ACETAMINOPHEN 5-325 MG PO TABS: 2.0000 | ORAL_TABLET | Freq: Three times a day (TID) | ORAL | 0 refills | Status: AC | PRN

## 2023-11-30 MED ORDER — OXYCODONE-ACETAMINOPHEN 5-325 MG PO TABS: 1.0000 | ORAL_TABLET | Freq: Once | ORAL | Status: DC

## 2023-11-30 MED ORDER — ACETAMINOPHEN 325 MG PO TABS
650.0000 mg | ORAL_TABLET | Freq: Once | ORAL | Status: AC
  Administered 2023-11-30: 650 mg via ORAL
  Filled 2023-11-30: qty 2

## 2023-11-30 MED ORDER — IOHEXOL 350 MG/ML SOLN
75.0000 mL | Freq: Once | INTRAVENOUS | Status: AC | PRN
Start: 2023-11-30 — End: 2023-11-30
  Administered 2023-11-30: 75 mL via INTRAVENOUS

## 2023-11-30 MED ORDER — OXYCODONE HCL 5 MG PO TABS
5.0000 mg | ORAL_TABLET | Freq: Once | ORAL | Status: AC
  Administered 2023-11-30: 5 mg via ORAL
  Filled 2023-11-30: qty 1

## 2023-11-30 NOTE — ED Triage Notes (Addendum)
 Pt states unable to open right eye and right eye painx1wk. Pt denies injury to eye. Pt's right eye is bulging out. Pt c/o blurred vision in right eye. Pt's wife states pt had a HA on Sat and was seen at [facility] on sat. Pt states vomited twice yest.

## 2023-11-30 NOTE — Discharge Instructions (Signed)
 Roger Phillips:  Thank you for allowing [place] to take care of you today.  We hope you begin feeling better soon.  To-Do: Please follow-up with ophthalmology.  Please call their office directly at [phone]  to schedule an appointment.  Please pick up your prescriptions at the attached pharmacy for your eye infection. Please return to the Emergency Department or call 911 if you experience chest pain, shortness of breath, severe pain, severe fever, altered mental status, or have any reason to think that you need emergency medical care.  Thank you again.  Hope you feel better soon.  Department of Emergency Medicine [facility]

## 2023-11-30 NOTE — ED Provider Triage Note (Signed)
 Emergency Medicine Provider Triage Evaluation Note  Roger Phillips , a 68 y.o. male  was evaluated in triage.  Pt complains of right eye swelling.  Reports that he was seen at [facility] on Friday night for headache.  Reports that he had CT scans of his head completed which were unremarkable he was discharged.  Patient states that the next morning on Saturday he woke up with some slight right eyelid swelling.  Reports that his eyelid was "halfway shut".  Reports that on Sunday morning he woke up and his entire right eye was swollen shut.  Denies contact usage.  States that there has been no drainage from his eye.  Denies fevers, blurred vision  Denies painful EOMs.  Reports he will sometimes have pain but states the pain is typically located above his bilateral eyes.  Review of Systems  Positive:  Negative:   Physical Exam  BP (!) 101/59 (BP Location: Left Arm)   Pulse 85   Temp 97.7 F (36.5 C)   Resp 16   Ht 6' (1.829 m)   Wt 93.9 kg   SpO2 100%   BMI 28.08 kg/m  Gen:   Awake, no distress   Resp:  Normal effort  MSK:   Moves extremities without difficulty  Other:    Medical Decision Making  Medically screening exam initiated at 1:29 PM.  Appropriate orders placed.  Roger Phillips was informed that the remainder of the evaluation will be completed by another provider, this initial triage assessment does not replace that evaluation, and the importance of remaining in the ED until their evaluation is complete.     Al Decant, PA-C 11/30/23 1331

## 2023-11-30 NOTE — ED Provider Notes (Signed)
 [place] EMERGENCY DEPARTMENT AT [facility] Provider Note  Arrival date/time:11/30/2023 10:14 PM  HPI/ROS   Roger Phillips is a 68 y.o. male with PMH significant for COPD, CAD, HLD, HTN, T2DM, prostate cancer who presents for eye problem History is provided by patient.  Patient endorses headache started a few days ago.  Was seen at the emergency department at that time and discharged.  However, he has now had persistent eye symptoms for the past few days.  He describes his eye is bulging and he is unable to open his right eyelid without using his hand.  He denies blurred vision in that eye.  But does describe pain in the eye and around the eye.  Denies trauma to the eye. Denies any infectious symptoms such as fever or URI symptoms.  A complete ROS was performed with pertinent positives/negatives noted above.   ED Course and Medical Decision Making   I personally reviewed the patient's vitals.  Assessment/Plan: This is a 68 year old patient presenting for eye problem. On exam, patient's right eye is proptotic, his extraocular movements are limited.  His right eye is nonreactive to light.  Visual acuity is similar on right and left.  Eye pressures are within normal limits on right and left.  Patient was recently seen at our emergency department 2 days ago for headache.  Had a CTA head that was largely unremarkable other than a possible incidental aneurysm. He received a migraine cocktail at that time and was discharged with PCP follow-up.  His workup today shows very mild leukocytosis to 10.7, no anemia.  BMP shows mild hyponatremia.  Creatinine is 1.51 which is mildly elevated from baseline. His CT scan is concerning for periorbital cellulitis with no signs of orbital cellulitis.  Given his presentation, I also low suspicion for temporal arteritis given lack of symptoms in the temporal area and pain largely localized to the periorbital region.  Patient was given IV fluids  for his AKI.  He was also given migraine cocktail with Tylenol and Compazine with minimal improvement. He was then given a dose of oxycodone.  Reassessment, pain is improved. Will plan for p.o. antibiotics and close follow-up with ophthalmology. I spoke with her ophthalmologist, Dr. Jenene Slicker, and will plan to see him in clinic later this week. Discussed this with patient and family at bedside and they voiced agreement and understanding of plan.  Disposition:  I discussed the plan for discharge with the patient and/or their surrogate at bedside prior to discharge and they were in agreement with the plan and verbalized understanding of the return precautions provided. All questions answered to the best of my ability. Ultimately, the patient was discharged in stable condition with stable vital signs. I am reassured that they are capable of close follow up and good social support at home.   Clinical Impression:  1. Periorbital cellulitis of right eye     Rx / DC Orders ED Discharge Orders          Ordered    clindamycin (CLEOCIN) 300 MG capsule  3 times daily        11/30/23 2043    amoxicillin-clavulanate (AUGMENTIN) 875-125 MG tablet  Every 12 hours        11/30/23 2043    HYDROcodone-acetaminophen (NORCO/VICODIN) 5-325 MG tablet  Every 8 hours PRN        11/30/23 2100            The plan for this patient was discussed with Dr. Jeraldine Loots, who  voiced agreement and who oversaw evaluation and treatment of this patient.   Clinical Complexity A medically appropriate history, review of systems, and physical exam was performed.  Patient's presentation is most consistent with acute presentation with potential threat to life or bodily function.  Medical Decision Making Risk OTC drugs. Prescription drug management.    Physical Exam and Medical History   Vitals:   11/30/23 2035 11/30/23 2115 11/30/23 2130 11/30/23 2145  BP: (!) 85/50 (!) 101/58 106/73 109/61  Pulse: 75 86 86 94   Resp: 19  18 19   Temp: (!) 97.5 F (36.4 C)     TempSrc: Oral     SpO2: 100% 98% 100% 96%  Weight:      Height:        Physical Exam Vitals and nursing note reviewed.  Constitutional:      General: He is not in acute distress.    Appearance: He is well-developed.  HENT:     Head: Normocephalic and atraumatic.  Eyes:     Intraocular pressure: Right eye pressure is 13 mmHg. Left eye pressure is 11 mmHg. Measurements were taken using a handheld tonometer.    Conjunctiva/sclera: Conjunctivae normal.     Comments: Right eye proptosis present. Right eye can look laterally, but not medially, upward, or downward, these movement cause diplopia. Right eye is nonreactive to light. However when light is flashed in the right eye, the left eye does constrict. Acuity: R 20/50, L 20/40  Cardiovascular:     Rate and Rhythm: Normal rate.  Abdominal:     Palpations: Abdomen is soft.  Musculoskeletal:        General: No swelling.     Cervical back: Neck supple.  Skin:    General: Skin is warm and dry.     Capillary Refill: Capillary refill takes less than 2 seconds.  Neurological:     Mental Status: He is alert.  Psychiatric:        Mood and Affect: Mood normal.     Medical History: Allergies  Allergen Reactions   Doxycycline Diarrhea   Morphine And Codeine Other (See Comments)    Insomnia    Sulfa Antibiotics Rash   Past Medical History:  Diagnosis Date   Arthritis    neck and left shoulder   Asthma    Back pain    buldging disc   Complication of anesthesia    difficult to wake up after neck surgery   COPD (chronic obstructive pulmonary disease) (HCC)    Depression    takes Wellbutrin   Diabetes mellitus    takes Metformin bid   Emphysema    GERD (gastroesophageal reflux disease)    takes Omeprazole daily   Hemorrhoids    High cholesterol    takes Pravastatin daily   HTN (hypertension)    takes Lisinopril,Norvasc,and HCTZ daily   IBS (irritable bowel syndrome)     Insomnia    doesn't take any meds    Pneumonia    hx of in 80's and 90's   Prostate cancer (HCC)    Sinus headache    sinus drainage    Past Surgical History:  Procedure Laterality Date   ANTERIOR CERVICAL DECOMP/DISCECTOMY FUSION  12/04/2011   Procedure: ANTERIOR CERVICAL DECOMPRESSION/DISCECTOMY FUSION 2 LEVELS;  Surgeon: Hewitt Shorts, MD;  Location: [facility];  Service: Neurosurgery;  Laterality: N/A;  Cervical Three-Four, Cervical Four-Five anterior cervial decompression with fusion plating and bonegraft   CARPAL TUNNEL RELEASE  10+yrs  right side   COLONOSCOPY N/A 04/19/2014   Procedure: COLONOSCOPY;  Surgeon: Malissa Hippo, MD;  Location: [facility] ENDO SUITE;  Service: Endoscopy;  Laterality: N/A;  1030-moved to 1230 Ann to notify pt   CYSTECTOMY  12-74yrs ago   from left side of neck   CYSTOSCOPY WITH URETHRAL DILATATION N/A 09/22/2022   Procedure: CYSTOSCOPY WITH URETHRAL DILATATION-optilume;  Surgeon: Malen Gauze, MD;  Location: [facility] ORS;  Service: Urology;  Laterality: N/A;   GOLD SEED IMPLANT N/A 12/06/2020   Procedure: GOLD SEED IMPLANT;  Surgeon: Malen Gauze, MD;  Location: [facility] ORS;  Service: Urology;  Laterality: N/A;   SPACE OAR INSTILLATION N/A 12/06/2020   Procedure: SPACE OAR INSTILLATION;  Surgeon: Malen Gauze, MD;  Location: [facility] ORS;  Service: Urology;  Laterality: N/A;   Family History  Problem Relation Age of Onset   Heart disease Father    Hypertension Father    Heart disease Brother    Anesthesia problems Neg Hx    Hypotension Neg Hx    Malignant hyperthermia Neg Hx    Pseudochol deficiency Neg Hx    Breast cancer Neg Hx    Prostate cancer Neg Hx    Colon cancer Neg Hx    Pancreatic cancer Neg Hx     Social History   Tobacco Use   Smoking status: Every Day    Current packs/day: 1.50    Average packs/day: 1.5 packs/day for 40.0 years (60.0 ttl pk-yrs)    Types: Cigarettes   Smokeless tobacco: Never  Vaping Use   Vaping  status: Never Used  Substance Use Topics   Alcohol use: Yes    Alcohol/week: 6.0 standard drinks of alcohol    Types: 6 Shots of liquor per week   Drug use: No    Procedures   If procedures were preformed on this patient, they are listed below:  Procedures   -------- HPI and MDM generated using voice dictation software and may contain dictation errors. Please contact me for any clarification or with any questions.   Cephus Slater, MD Emergency Medicine PGY-2    Caron Presume, MD 11/30/23 2214    Gerhard Munch, MD 11/30/23 [id number]

## 2023-11-30 NOTE — Telephone Encounter (Signed)
 Nurses Reach out to [place] and [place] They should be able to get in with Toni Amend this afternoon

## 2023-12-04 ENCOUNTER — Ambulatory Visit: Payer: Self-pay | Admitting: Family Medicine

## 2023-12-04 NOTE — Telephone Encounter (Addendum)
 Chief Complaint: eye swelling Symptoms: eye swelling, headache Frequency: a week Pertinent Negatives: Patient denies fever, CP, SOB Disposition: [x] ED /[] Urgent Care (no appt availability in office) / [] Appointment(In office/virtual)/ []  [facility]/ [] Home Care/ [x] Refused Recommended Disposition /[] [facility]/ []  Follow-up with PCP Additional Notes: Wife calls on behalf of pt. Wife reports pt has had R eye swelling for 1 wk. Diagnosed with periorbital swelling at [facility] on 2/24. Also went to [facility] on 2/22. Taking prescribed antibiotics. Wife endorses white drainage from that eye and states it is "bulging." States pt is unable to open that eye unless he manually opens it with his hands. Endorses blurry vision. 10/10 headache. No missed doses on BP meds. No CP or SOB. Wife endorses pt has been nauseous and is vomiting. No fever. Wife states pt does not seem like himself. RN advised wife pt needs to go back to the ED. Wife states that pt will most likely not be agreeable to that. RN educated wife on why the ED is the best and safest place for the pt. She said she will ask the pt. RN advised wife they need to call 911 if he worsens, she verbalized understanding. RN called the CAL to inform. CAL advised RN to schedule the pt a hospital follow-up visit this Monday 3/3. RN called pt's wife using number on file to arrange that. RN called twice and it went straight to voicemail. Voicemail box full, RN unable to leave a message at this time.   Copied from CRM [id number]. Topic: Clinical - Red Word Triage >> Dec 04, 2023 12:54 PM Ebonie J wrote: [facility] that prompted transfer to Nurse Triage: Pt wife stated he's having severe headaches and eye swelling Reason for Disposition  [1] SEVERE eyelid swelling on one side AND [2] red and painful (or tender to touch)  Answer Assessment - Initial Assessment Questions 1. ONSET: "When did the swelling start?" (e.g., minutes, hours, days)     A  week  2. LOCATION: "What part of the eyelids is swollen?"     R eye  3. SEVERITY: "How swollen is it?"     Eyeball is "bulging", eye dr. today said "nothing is wrong with the eye" - eyelid and under eye is red 4. ITCHING: "Is there any itching?" If Yes, ask: "How much?"   (Scale 1-10; mild, moderate or severe)     No 5. PAIN: "Is the swelling painful to touch?" If Yes, ask: "How painful is it?"   (Scale 1-10; mild, moderate or severe)     8/10 pain 6. FEVER: "Do you have a fever?" If Yes, ask: "What is it, how was it measured, and when did it start?"      No 7. CAUSE: "What do you think is causing the swelling?"     Not sure 8. RECURRENT SYMPTOM: "Have you had eyelid swelling before?" If Yes, ask: "When was the last time?" "What happened that time?"     No 9. OTHER SYMPTOMS: "Do you have any other symptoms?" (e.g., blurred vision, eye discharge, rash, runny nose)     Unable to open his eye unless he opens it with his hand, wife endorses drainage (white mucus, some of it is clear), no fever. Wife endorses vomiting. States he was vomiting Sunday, then it stopped. Has not eaten a meal since Saturday. Started throwing up yesterday. Severe headache, was seen 2/22 at [facility]. Took pt off hydrochlorothiazide. Currently taking lisinopril, no missed doses. Endorses blurry  vision - "he can't see." Sometimes he sees double. Wife endorses dizziness, weakness, pt is sick on his stomach, weak. No CP or SOB. 10/10 headache per wife. Wife states he is forgetting when he took his medicine. Taking antibiotics and hydrocodone prescribed at [facility].  Protocols used: Eye - Swelling-A-AH

## 2023-12-04 NOTE — Telephone Encounter (Signed)
 Spoke with his wife [name] around 3:45. Has had 2 ED visits one at [facility] and the second at [facility] on 11/30/23. Last diagnosis was periorbital cellulitis. He has been on 4 days of 2 antibiotics with no improvement. No fever. Still having severe headache. She states he had to get several bags of fluid at ED due to low BP and dehydration. Eye swollen shut. Has seen the ophthalmologist and was told this is not related to his actual eye so no intervention there. Reports his sugars as being good.  Recommend ED visit at [facility] or [facility]. Patient is reluctant to go back to either one at this time. Emphasized the danger in waiting due to no improvement. His wife will talk to him to reconsider.

## 2023-12-07 ENCOUNTER — Ambulatory Visit (INDEPENDENT_AMBULATORY_CARE_PROVIDER_SITE_OTHER): Admitting: Nurse Practitioner

## 2023-12-07 ENCOUNTER — Inpatient Hospital Stay: Payer: PPO | Admitting: Nurse Practitioner

## 2023-12-07 VITALS — BP 122/79 | HR 63 | Temp 97.7°F | Ht 72.0 in | Wt 205.8 lb

## 2023-12-07 DIAGNOSIS — H579 Unspecified disorder of eye and adnexa: Secondary | ICD-10-CM

## 2023-12-07 DIAGNOSIS — L03213 Periorbital cellulitis: Secondary | ICD-10-CM | POA: Diagnosis not present

## 2023-12-07 DIAGNOSIS — H518 Other specified disorders of binocular movement: Secondary | ICD-10-CM | POA: Diagnosis not present

## 2023-12-07 DIAGNOSIS — H0520 Unspecified exophthalmos: Secondary | ICD-10-CM | POA: Diagnosis not present

## 2023-12-07 MED ORDER — OXYCODONE-ACETAMINOPHEN 5-325 MG PO TABS
1.0000 | ORAL_TABLET | Freq: Four times a day (QID) | ORAL | 0 refills | Status: DC | PRN
Start: 2023-12-07 — End: 2023-12-24

## 2023-12-07 NOTE — Patient Instructions (Signed)
 Align bowel probiotic

## 2023-12-08 ENCOUNTER — Encounter: Payer: Self-pay | Admitting: Nurse Practitioner

## 2023-12-08 DIAGNOSIS — H518 Other specified disorders of binocular movement: Secondary | ICD-10-CM | POA: Insufficient documentation

## 2023-12-08 DIAGNOSIS — H0520 Unspecified exophthalmos: Secondary | ICD-10-CM | POA: Insufficient documentation

## 2023-12-08 DIAGNOSIS — H579 Unspecified disorder of eye and adnexa: Secondary | ICD-10-CM | POA: Insufficient documentation

## 2023-12-08 DIAGNOSIS — L03213 Periorbital cellulitis: Secondary | ICD-10-CM | POA: Insufficient documentation

## 2023-12-08 NOTE — Progress Notes (Signed)
 Subjective:    Patient ID: Roger Phillips, male    DOB: October 11, 1955, 68 y.o.   MRN: [id number]  HPI Presents for follow-up see previous office notes and MyChart messages.  His wife is present during the visit per his request.  Patient was seen at [facility] ED on 11/28/2023 for a bad headache.  Was seen again on 11/30/2023 at [facility] ED for periorbital cellulitis of the right eye.  Since that time has seen an eye specialist who examined his right eye and stated that there is nothing wrong with the eye itself and recommended a neurologic consultation.  Patient has almost completed his course of Augmentin and clindamycin.  Denies any diarrhea.  No fever.  Does upset his stomach with nausea and occasional vomiting.  Decreased appetite but taking fluids well.  Limited food intake mainly soup and crackers. Wife reports his sugars as "good", all less than 200.  Generalized fatigue and sleeping more than usual.  At this time the headache is very mild and much improved.  The swelling around his eye is also greatly improved.  States he has drainage on the right upper eyelid each morning that he has to clean off.  He is now able to open his eye part of the way which is an improvement according to patient and his wife.  Describes his vision in the right eye as "double vision".  Denies any numbness or weakness of the right side of the face or difficulty speaking or swallowing. Taking occasional Oxycodone for severe pain.   Review of Systems  Constitutional:  Positive for fatigue. Negative for fever.  HENT:  Negative for trouble swallowing.   Respiratory:  Negative for cough, chest tightness and shortness of breath.   Cardiovascular:  Negative for chest pain.  Neurological:  Positive for headaches. Negative for syncope, facial asymmetry, speech difficulty, weakness and numbness.       Mild headache at this point much improved.       Objective:   Physical Exam NAD.  Alert, oriented.  Left eye conjunctiva  minimally injected normal pupillary response.  Right eye fixed pupil, minimal injection with mid dilation unresponsive to light.  Extraocular movements: Left eye moves without difficulty, right eye moves very minimally in any direction. Mild exophthalmos of right eye compared to left. Can open the right eye lid about 1/2 - 2/3 with effort, but does not stay open. No facial asymmetry. Speech clear. No right temporal area tenderness. No tenderness around the right eye. Minimal edema above the right eye. No erythema. Lungs clear. Heart RRR. Gait slightly unsteady when walking down the hall.  Today's Vitals   12/07/23 1545  BP: 122/79  Pulse: 63  Temp: 97.7 F (36.5 C)  SpO2: 99%  Weight: 205 lb 12.8 oz (93.4 kg)  Height: 6' (1.829 m)   Body mass index is 27.91 kg/m.  11/28/23: see CT scan head without contrast 11/29/23: see CT angio head/neck with/without contrast 11/30/23: see CT obits with contrast     Assessment & Plan:   Problem List Items Addressed This Visit       Other   Dysconjugate gaze   Relevant Orders   Ambulatory referral to Neurology   Exophthalmos of right eye   Relevant Orders   Ambulatory referral to Neurology   Fixed pupil of right eye   Relevant Orders   Ambulatory referral to Neurology   Periorbital cellulitis of right eye - Primary   Meds ordered this encounter  Medications  oxyCODONE-acetaminophen (PERCOCET/ROXICET) 5-325 MG tablet    Sig: Take 1 tablet by mouth every 6 (six) hours as needed for severe pain (pain score 7-10).    Dispense:  15 tablet    Refill:  0    Supervising Provider:   Lilyan Punt A [[id number]]   Continue Oxycodone as directed; use sparingly for severe pain. Complete antibiotics as directed.  Urgent referral placed to neurology. Asked that he be placed on call list if needed. He agrees.  Warning signs reviewed. Call back or go to ED in the meantime if any new or worsening symptoms.

## 2023-12-09 ENCOUNTER — Encounter: Payer: Self-pay | Admitting: Family Medicine

## 2023-12-09 ENCOUNTER — Telehealth: Payer: Self-pay | Admitting: Family Medicine

## 2023-12-09 NOTE — Telephone Encounter (Signed)
 Nurses Please touch base with patient As best as possible to please try to get him an appointment with one of my open slots either Thursday or Friday  See MyChart message below Call his wife Roger Phillips  Dr. Lorin Picket. They have checked a lot about Teddys eye and after almost 2weeks. Nothing. Can you set him up for an MRI. I feel like that Saturday night at [facility] he had some kind of stroke. I thought the May check him for that but they didn't. He still can't see has headaches and can't walk good at all. Let me know if he can I'm getting really worried because he has been on antibiotics and pain meds since Monday February 24. You know that's not like him at all. Than you [name]

## 2023-12-10 NOTE — Telephone Encounter (Signed)
 Patient scheduled with Dr Lorin Picket : 12/11/2023 at 9:20 AM

## 2023-12-11 ENCOUNTER — Encounter: Payer: Self-pay | Admitting: Family Medicine

## 2023-12-11 ENCOUNTER — Ambulatory Visit: Admitting: Family Medicine

## 2023-12-11 VITALS — BP 115/75 | HR 87 | Temp 97.9°F | Ht 72.0 in | Wt 202.0 lb

## 2023-12-11 DIAGNOSIS — E871 Hypo-osmolality and hyponatremia: Secondary | ICD-10-CM

## 2023-12-11 DIAGNOSIS — H518 Other specified disorders of binocular movement: Secondary | ICD-10-CM

## 2023-12-11 DIAGNOSIS — G44209 Tension-type headache, unspecified, not intractable: Secondary | ICD-10-CM

## 2023-12-11 DIAGNOSIS — R519 Headache, unspecified: Secondary | ICD-10-CM

## 2023-12-11 DIAGNOSIS — H0520 Unspecified exophthalmos: Secondary | ICD-10-CM | POA: Diagnosis not present

## 2023-12-11 NOTE — Progress Notes (Signed)
 Subjective:    Patient ID: Roger Phillips, male    DOB: 11-14-1955, 68 y.o.   MRN: [id number]  Discussed the use of AI scribe software for clinical note transcription with the patient, who gave verbal consent to proceed.  History of Present Illness   The patient presents with double vision and headache. He is accompanied by [name], who appears to be a caregiver or family member. He was referred by Eber Jones for neurology consultation.  He has been experiencing double vision and severe headaches since Saturday night. The headaches are intense and bilateral. Initially, he thought it was a sinus headache and took Tylenol Sinus, but the pain worsened, leading to an ER visit on the 22nd. A head scan and angiography were performed, and he was discharged.  Following the ER visit, his right eye began to swell and drain slightly. An eye doctor visit on the following Friday included a regular checkup and eye dilation, which found no issues with the eye itself. However, his vision has not improved, and he continues to experience double vision when both eyes are open.  He was prescribed antibiotics but had to stop due to vomiting and weakness. He has been unable to eat properly, only managing to consume peanut butter crackers and some solid food like eggs and sandwiches recently. He also mentions taking albuterol for headaches at night.  His headaches have lessened in intensity but still occur occasionally on the right side. He reports difficulty balancing and needing to hold onto walls to navigate to the bathroom. He cannot make out faces on TV or read the screen, indicating a significant impact on his daily activities.  No significant pain when pressure is applied to his neck. His right eye burns severely and lacks control, unable to move left, right, up, or down.         Review of Systems     Objective:    Physical Exam   NEUROLOGICAL: Cranial nerve function impaired on the right; right eye  cannot move left, right, up, or down.    General-in no acute distress Eyes-no discharge Lungs-respiratory rate normal, CTA CV-no murmurs,RRR Extremities skin warm dry no edema  patient cannot open up the eyelid on the right side except with extreme effortHe is able to raise his eyebrows He is able to squint his eyes together As for the EOMI the right eye is fixed forward cannot gaze up down left or right the left eye follows fairly well when both eyes are open he sees double vision pupil responsive to light on the right side is absent left side looks good  Use of arms and legs are good Assessment & Plan:  Assessment and Plan    Right Eye Vision Loss  onset of vision loss in the right eye with double vision when both eyes are open over the past few weeks. No improvement with antibiotics. No evidence of ongoing infection. Eye examination by an ophthalmologist reportedly normal. -Order MRI to assess for neurological causes of vision loss. -Contact ophthalmologist for report of recent eye examination.  Headache Severe bilateral headaches, initially thought to be sinus-related. Improvement noted. -Continue monitoring symptoms.  General Health Maintenance -Order blood work to assess overall health status. -Follow-up on results of MRI and blood work.     Lab work ordered Stat MRI ordered It appears that they are able to get him done on Monday.  Given that he has been having this for several weeks that seems reasonable.  Patient will  more than likely need input from neurology and quite possibly ophthalmology again.  Await the findings. The possibility of temporal arteritis present but we will wait to see what sed rate CRP shows he does not have any temporal pain or temporal headaches

## 2023-12-12 ENCOUNTER — Other Ambulatory Visit: Payer: Self-pay | Admitting: Family Medicine

## 2023-12-12 ENCOUNTER — Ambulatory Visit (HOSPITAL_COMMUNITY)
Admission: RE | Admit: 2023-12-12 | Discharge: 2023-12-12 | Disposition: A | Discharge status: Home / Self Care | Source: Ambulatory Visit | Attending: Family Medicine | Admitting: Family Medicine

## 2023-12-12 ENCOUNTER — Encounter: Payer: Self-pay | Admitting: Family Medicine

## 2023-12-12 DIAGNOSIS — H518 Other specified disorders of binocular movement: Secondary | ICD-10-CM | POA: Diagnosis not present

## 2023-12-12 DIAGNOSIS — R519 Headache, unspecified: Secondary | ICD-10-CM | POA: Insufficient documentation

## 2023-12-12 DIAGNOSIS — E236 Other disorders of pituitary gland: Secondary | ICD-10-CM | POA: Diagnosis not present

## 2023-12-12 DIAGNOSIS — R220 Localized swelling, mass and lump, head: Secondary | ICD-10-CM | POA: Diagnosis not present

## 2023-12-12 DIAGNOSIS — G44209 Tension-type headache, unspecified, not intractable: Secondary | ICD-10-CM | POA: Insufficient documentation

## 2023-12-12 DIAGNOSIS — K118 Other diseases of salivary glands: Secondary | ICD-10-CM | POA: Diagnosis not present

## 2023-12-12 DIAGNOSIS — C61 Malignant neoplasm of prostate: Secondary | ICD-10-CM | POA: Diagnosis not present

## 2023-12-12 DIAGNOSIS — H0520 Unspecified exophthalmos: Secondary | ICD-10-CM | POA: Diagnosis not present

## 2023-12-12 LAB — CBC
Hematocrit: 44.2 % (ref 37.5–51.0)
Hemoglobin: 15.4 g/dL (ref 13.0–17.7)
MCH: 30.9 pg (ref 26.6–33.0)
MCHC: 34.8 g/dL (ref 31.5–35.7)
MCV: 89 fL (ref 79–97)
Platelets: 311 10*3/uL (ref 150–450)
RBC: 4.98 x10E6/uL (ref 4.14–5.80)
RDW: 13.4 % (ref 11.6–15.4)
WBC: 6.1 10*3/uL (ref 3.4–10.8)

## 2023-12-12 LAB — BASIC METABOLIC PANEL
BUN/Creatinine Ratio: 15 (ref 10–24)
BUN: 24 mg/dL (ref 8–27)
CO2: 24 mmol/L (ref 20–29)
Calcium: 9.8 mg/dL (ref 8.6–10.2)
Chloride: 87 mmol/L — ABNORMAL LOW (ref 96–106)
Creatinine, Ser: 1.65 mg/dL — ABNORMAL HIGH (ref 0.76–1.27)
Glucose: 112 mg/dL — ABNORMAL HIGH (ref 70–99)
Potassium: 3.8 mmol/L (ref 3.5–5.2)
Sodium: 131 mmol/L — ABNORMAL LOW (ref 134–144)
eGFR: 45 mL/min/{1.73_m2} — ABNORMAL LOW (ref >59)

## 2023-12-12 LAB — SEDIMENTATION RATE: Sed Rate: 15 mm/h (ref 0–30)

## 2023-12-12 LAB — C-REACTIVE PROTEIN: CRP: 4 mg/L (ref 0–10)

## 2023-12-12 MED ORDER — GADOBUTROL 1 MMOL/ML IV SOLN
9.0000 mL | Freq: Once | INTRAVENOUS | Status: AC | PRN
  Administered 2023-12-12: 9 mL via INTRAVENOUS

## 2023-12-13 ENCOUNTER — Encounter: Payer: Self-pay | Admitting: Family Medicine

## 2023-12-16 ENCOUNTER — Other Ambulatory Visit: Payer: Self-pay

## 2023-12-16 DIAGNOSIS — D352 Benign neoplasm of pituitary gland: Secondary | ICD-10-CM | POA: Diagnosis not present

## 2023-12-16 DIAGNOSIS — E237 Disorder of pituitary gland, unspecified: Secondary | ICD-10-CM

## 2023-12-17 ENCOUNTER — Encounter: Payer: Self-pay | Admitting: Family Medicine

## 2023-12-18 ENCOUNTER — Other Ambulatory Visit: Payer: Self-pay | Admitting: Neurosurgery

## 2023-12-18 DIAGNOSIS — D352 Benign neoplasm of pituitary gland: Secondary | ICD-10-CM | POA: Diagnosis not present

## 2023-12-21 ENCOUNTER — Other Ambulatory Visit: Payer: Self-pay | Admitting: Neurosurgery

## 2023-12-21 DIAGNOSIS — D497 Neoplasm of unspecified behavior of endocrine glands and other parts of nervous system: Secondary | ICD-10-CM | POA: Diagnosis not present

## 2023-12-22 NOTE — Pre-Procedure Instructions (Signed)
 Surgical Instructions   Your procedure is scheduled on Friday, March 21st. Report to [facility] Main Entrance "A" at 05:30 A.M., then check in with the Admitting office. Any questions or running late day of surgery: call [phone]  Questions prior to your surgery date: call [phone], Monday-Friday, 8am-4pm. If you experience any cold or flu symptoms such as cough, fever, chills, shortness of breath, etc. between now and your scheduled surgery, please notify us at the above number.     Remember:  Do not eat or drink after midnight the night before your surgery    Take these medicines the morning of surgery with A SIP OF WATER  alfuzosin (UROXATRAL)  omeprazole (PRILOSEC)  phenazopyridine (PYRIDIUM)  pravastatin (PRAVACHOL)  sucralfate (CARAFATE)  WIXELA INHUB   May take these medicines IF NEEDED: albuterol (VENTOLIN HFA)- bring inhaler with you   One week prior to surgery, STOP taking any Aspirin (unless otherwise instructed by your surgeon) Aleve, Naproxen, Ibuprofen, Motrin, Advil, Goody's, BC's, all herbal medications, fish oil, and non-prescription vitamins.  WHAT DO I DO ABOUT MY DIABETES MEDICATION?   Do not take glipiZIDE (GLUCOTROL) the evening before surgery (3/20) OR the morning of surgery (3/21).  THE NIGHT BEFORE SURGERY, take 20 units (50%) of insulin glargine (LANTUS SOLOSTAR).     If your CBG is greater than 220 mg/dL, you may take  of your sliding scale (correction) dose of insulin aspart (NOVOLOG).   HOW TO MANAGE YOUR DIABETES BEFORE AND AFTER SURGERY  Why is it important to control my blood sugar before and after surgery? Improving blood sugar levels before and after surgery helps healing and can limit problems. A way of improving blood sugar control is eating a healthy diet by:  Eating less sugar and carbohydrates  Increasing activity/exercise  Talking with your doctor about reaching your blood sugar goals High blood sugars (greater than 180  mg/dL) can raise your risk of infections and slow your recovery, so you will need to focus on controlling your diabetes during the weeks before surgery. Make sure that the doctor who takes care of your diabetes knows about your planned surgery including the date and location.  How do I manage my blood sugar before surgery? Check your blood sugar at least 4 times a day, starting 2 days before surgery, to make sure that the level is not too high or low.  Check your blood sugar the morning of your surgery when you wake up and every 2 hours until you get to the Short Stay unit.  If your blood sugar is less than 70 mg/dL, you will need to treat for low blood sugar: Do not take insulin. Treat a low blood sugar (less than 70 mg/dL) with  cup of clear juice (cranberry or apple), 4 glucose tablets, OR glucose gel. Recheck blood sugar in 15 minutes after treatment (to make sure it is greater than 70 mg/dL). If your blood sugar is not greater than 70 mg/dL on recheck, call [phone] for further instructions. Report your blood sugar to the short stay nurse when you get to Short Stay.  If you are admitted to the hospital after surgery: Your blood sugar will be checked by the staff and you will probably be given insulin after surgery (instead of oral diabetes medicines) to make sure you have good blood sugar levels. The goal for blood sugar control after surgery is 80-180 mg/dL.  Do NOT Smoke (Tobacco/Vaping) for 24 hours prior to your procedure.  If you use a CPAP at night, you may bring your mask/headgear for your overnight stay.   You will be asked to remove any contacts, glasses, piercing's, hearing aid's, dentures/partials prior to surgery. Please bring cases for these items if needed.    Patients discharged the day of surgery will not be allowed to drive home, and someone needs to stay with them for 24 hours.  SURGICAL WAITING ROOM VISITATION Patients may have no more than 2  support people in the waiting area - these visitors may rotate.   Pre-op nurse will coordinate an appropriate time for 1 ADULT support person, who may not rotate, to accompany patient in pre-op.  Children under the age of 51 must have an adult with them who is not the patient and must remain in the main waiting area with an adult.  If the patient needs to stay at the hospital during part of their recovery, the visitor guidelines for inpatient rooms apply.  Please refer to the [facility] website for the visitor guidelines for any additional information.   If you received a COVID test during your pre-op visit  it is requested that you wear a mask when out in public, stay away from anyone that may not be feeling well and notify your surgeon if you develop symptoms. If you have been in contact with anyone that has tested positive in the last 10 days please notify you surgeon.      Pre-operative CHG Bathing Instructions   You can play a key role in reducing the risk of infection after surgery. Your skin needs to be as free of germs as possible. You can reduce the number of germs on your skin by washing with CHG (chlorhexidine gluconate) soap before surgery. CHG is an antiseptic soap that kills germs and continues to kill germs even after washing.   DO NOT use if you have an allergy to chlorhexidine/CHG or antibacterial soaps. If your skin becomes reddened or irritated, stop using the CHG and notify one of our RNs at [phone].              TAKE A SHOWER THE NIGHT BEFORE SURGERY AND THE DAY OF SURGERY    Please keep in mind the following:  DO NOT shave, including legs and underarms, 48 hours prior to surgery.   You may shave your face before/day of surgery.  Place clean sheets on your bed the night before surgery Use a clean washcloth (not used since being washed) for each shower. DO NOT sleep with pet's night before surgery.  CHG Shower Instructions:  Wash your face and private area with  normal soap. If you choose to wash your hair, wash first with your normal shampoo.  After you use shampoo/soap, rinse your hair and body thoroughly to remove shampoo/soap residue.  Turn the water OFF and apply half the bottle of CHG soap to a CLEAN washcloth.  Apply CHG soap ONLY FROM YOUR NECK DOWN TO YOUR TOES (washing for 3-5 minutes)  DO NOT use CHG soap on face, private areas, open wounds, or sores.  Pay special attention to the area where your surgery is being performed.  If you are having back surgery, having someone wash your back for you may be helpful. Wait 2 minutes after CHG soap is applied, then you may rinse off the CHG soap.  Pat dry with a clean towel  Put on clean pajamas  Additional instructions for the day of surgery: DO NOT APPLY any lotions, deodorants, cologne, or perfumes.   Do not wear jewelry or makeup Do not wear nail polish, gel polish, artificial nails, or any other type of covering on natural nails (fingers and toes) Do not bring valuables to the hospital. [facility] is not responsible for valuables/personal belongings. Put on clean/comfortable clothes.  Please brush your teeth.  Ask your nurse before applying any prescription medications to the skin.

## 2023-12-23 ENCOUNTER — Other Ambulatory Visit: Payer: Self-pay

## 2023-12-23 ENCOUNTER — Encounter (HOSPITAL_COMMUNITY): Payer: Self-pay

## 2023-12-23 ENCOUNTER — Encounter (HOSPITAL_COMMUNITY)
Admission: RE | Admit: 2023-12-23 | Discharge: 2023-12-23 | Disposition: A | Discharge status: Home / Self Care | Source: Ambulatory Visit | Attending: Neurosurgery | Admitting: Neurosurgery

## 2023-12-23 VITALS — BP 139/79 | HR 59 | Temp 97.7°F | Resp 18 | Ht 72.0 in | Wt 202.6 lb

## 2023-12-23 DIAGNOSIS — J449 Chronic obstructive pulmonary disease, unspecified: Secondary | ICD-10-CM | POA: Insufficient documentation

## 2023-12-23 DIAGNOSIS — E119 Type 2 diabetes mellitus without complications: Secondary | ICD-10-CM

## 2023-12-23 DIAGNOSIS — N179 Acute kidney failure, unspecified: Secondary | ICD-10-CM | POA: Diagnosis not present

## 2023-12-23 DIAGNOSIS — F17200 Nicotine dependence, unspecified, uncomplicated: Secondary | ICD-10-CM | POA: Insufficient documentation

## 2023-12-23 DIAGNOSIS — J439 Emphysema, unspecified: Secondary | ICD-10-CM | POA: Diagnosis not present

## 2023-12-23 DIAGNOSIS — E876 Hypokalemia: Secondary | ICD-10-CM | POA: Diagnosis not present

## 2023-12-23 DIAGNOSIS — F17210 Nicotine dependence, cigarettes, uncomplicated: Secondary | ICD-10-CM | POA: Diagnosis not present

## 2023-12-23 DIAGNOSIS — K219 Gastro-esophageal reflux disease without esophagitis: Secondary | ICD-10-CM | POA: Insufficient documentation

## 2023-12-23 DIAGNOSIS — Z01818 Encounter for other preprocedural examination: Secondary | ICD-10-CM | POA: Insufficient documentation

## 2023-12-23 DIAGNOSIS — I10 Essential (primary) hypertension: Secondary | ICD-10-CM | POA: Diagnosis not present

## 2023-12-23 DIAGNOSIS — E872 Acidosis, unspecified: Secondary | ICD-10-CM | POA: Diagnosis not present

## 2023-12-23 DIAGNOSIS — Z79899 Other long term (current) drug therapy: Secondary | ICD-10-CM | POA: Insufficient documentation

## 2023-12-23 DIAGNOSIS — J4489 Other specified chronic obstructive pulmonary disease: Secondary | ICD-10-CM | POA: Diagnosis not present

## 2023-12-23 DIAGNOSIS — D352 Benign neoplasm of pituitary gland: Secondary | ICD-10-CM | POA: Diagnosis not present

## 2023-12-23 DIAGNOSIS — E230 Hypopituitarism: Secondary | ICD-10-CM | POA: Diagnosis not present

## 2023-12-23 DIAGNOSIS — F32A Depression, unspecified: Secondary | ICD-10-CM | POA: Diagnosis not present

## 2023-12-23 DIAGNOSIS — E7800 Pure hypercholesterolemia, unspecified: Secondary | ICD-10-CM | POA: Diagnosis not present

## 2023-12-23 DIAGNOSIS — Z794 Long term (current) use of insulin: Secondary | ICD-10-CM | POA: Insufficient documentation

## 2023-12-23 LAB — TYPE AND SCREEN
ABO/RH(D): O POS
Antibody Screen: NEGATIVE

## 2023-12-23 LAB — CBC
HCT: 41.4 % (ref 39.0–52.0)
Hemoglobin: 14.3 g/dL (ref 13.0–17.0)
MCH: 31 pg (ref 26.0–34.0)
MCHC: 34.5 g/dL (ref 30.0–36.0)
MCV: 89.8 fL (ref 80.0–100.0)
Platelets: 231 10*3/uL (ref 150–400)
RBC: 4.61 MIL/uL (ref 4.22–5.81)
RDW: 13.6 % (ref 11.5–15.5)
WBC: 4.9 10*3/uL (ref 4.0–10.5)
nRBC: 0 % (ref 0.0–0.2)

## 2023-12-23 LAB — BASIC METABOLIC PANEL
Anion gap: 5 (ref 5–15)
BUN: 15 mg/dL (ref 8–23)
CO2: 30 mmol/L (ref 22–32)
Calcium: 9.5 mg/dL (ref 8.9–10.3)
Chloride: 100 mmol/L (ref 98–111)
Creatinine, Ser: 1.52 mg/dL — ABNORMAL HIGH (ref 0.61–1.24)
GFR, Estimated: 49 mL/min — ABNORMAL LOW (ref >60)
Glucose, Bld: 107 mg/dL — ABNORMAL HIGH (ref 70–99)
Potassium: 3.9 mmol/L (ref 3.5–5.1)
Sodium: 135 mmol/L (ref 135–145)

## 2023-12-23 LAB — HEMOGLOBIN A1C
Hgb A1c MFr Bld: 6.9 % — ABNORMAL HIGH (ref 4.8–5.6)
Mean Plasma Glucose: 151.33 mg/dL

## 2023-12-23 LAB — GLUCOSE, CAPILLARY: Glucose-Capillary: 113 mg/dL — ABNORMAL HIGH (ref 70–99)

## 2023-12-23 NOTE — Pre-Procedure Instructions (Signed)
 Surgical Instructions   Your procedure is scheduled on Friday, March 21st. Report to [facility] Main Entrance "A" at 05:30 A.M., then check in with the Admitting office. Any questions or running late day of surgery: call [phone]  Questions prior to your surgery date: call [phone], Monday-Friday, 8am-4pm. If you experience any cold or flu symptoms such as cough, fever, chills, shortness of breath, etc. between now and your scheduled surgery, please notify us at the above number.     Remember:  Do not eat or drink after midnight the night before your surgery    Take these medicines the morning of surgery with A SIP OF WATER  omeprazole (PRILOSEC)  pravastatin (PRAVACHOL)  sucralfate (CARAFATE)  WIXELA INHUB  cetirizine (ZYRTEC)   May take these medicines IF NEEDED: albuterol (VENTOLIN HFA)- bring inhaler with you oxyCODONE-acetaminophen (PERCOCET/ROXICET)   One week prior to surgery, STOP taking any Aspirin (unless otherwise instructed by your surgeon) Aleve, Naproxen, Ibuprofen, Motrin, Advil, Goody's, BC's, all herbal medications, fish oil, and non-prescription vitamins.   WHAT DO I DO ABOUT MY DIABETES MEDICATION?   Do not take glipiZIDE (GLUCOTROL) the evening before surgery (3/20) OR the morning of surgery (3/21).   HOW TO MANAGE YOUR DIABETES BEFORE AND AFTER SURGERY  Why is it important to control my blood sugar before and after surgery? Improving blood sugar levels before and after surgery helps healing and can limit problems. A way of improving blood sugar control is eating a healthy diet by:  Eating less sugar and carbohydrates  Increasing activity/exercise  Talking with your doctor about reaching your blood sugar goals High blood sugars (greater than 180 mg/dL) can raise your risk of infections and slow your recovery, so you will need to focus on controlling your diabetes during the weeks before surgery. Make sure that the doctor who takes care of your  diabetes knows about your planned surgery including the date and location.  How do I manage my blood sugar before surgery? Check your blood sugar at least 4 times a day, starting 2 days before surgery, to make sure that the level is not too high or low.  Check your blood sugar the morning of your surgery when you wake up and every 2 hours until you get to the Short Stay unit.  If your blood sugar is less than 70 mg/dL, you will need to treat for low blood sugar: Do not take insulin. Treat a low blood sugar (less than 70 mg/dL) with  cup of clear juice (cranberry or apple), 4 glucose tablets, OR glucose gel. Recheck blood sugar in 15 minutes after treatment (to make sure it is greater than 70 mg/dL). If your blood sugar is not greater than 70 mg/dL on recheck, call [phone] for further instructions. Report your blood sugar to the short stay nurse when you get to Short Stay.  If you are admitted to the hospital after surgery: Your blood sugar will be checked by the staff and you will probably be given insulin after surgery (instead of oral diabetes medicines) to make sure you have good blood sugar levels. The goal for blood sugar control after surgery is 80-180 mg/dL.                     Do NOT Smoke (Tobacco/Vaping) for 24 hours prior to your procedure.  If you use a CPAP at night, you may bring your mask/headgear for your overnight stay.   You will be asked to remove any  contacts, glasses, piercing's, hearing aid's, dentures/partials prior to surgery. Please bring cases for these items if needed.    Patients discharged the day of surgery will not be allowed to drive home, and someone needs to stay with them for 24 hours.  SURGICAL WAITING ROOM VISITATION Patients may have no more than 2 support people in the waiting area - these visitors may rotate.   Pre-op nurse will coordinate an appropriate time for 1 ADULT support person, who may not rotate, to accompany patient in pre-op.   Children under the age of 1 must have an adult with them who is not the patient and must remain in the main waiting area with an adult.  If the patient needs to stay at the hospital during part of their recovery, the visitor guidelines for inpatient rooms apply.  Please refer to the [facility] website for the visitor guidelines for any additional information.   If you received a COVID test during your pre-op visit  it is requested that you wear a mask when out in public, stay away from anyone that may not be feeling well and notify your surgeon if you develop symptoms. If you have been in contact with anyone that has tested positive in the last 10 days please notify you surgeon.      Pre-operative CHG Bathing Instructions   You can play a key role in reducing the risk of infection after surgery. Your skin needs to be as free of germs as possible. You can reduce the number of germs on your skin by washing with CHG (chlorhexidine gluconate) soap before surgery. CHG is an antiseptic soap that kills germs and continues to kill germs even after washing.   DO NOT use if you have an allergy to chlorhexidine/CHG or antibacterial soaps. If your skin becomes reddened or irritated, stop using the CHG and notify one of our RNs at [phone].              TAKE A SHOWER THE NIGHT BEFORE SURGERY AND THE DAY OF SURGERY    Please keep in mind the following:  DO NOT shave, including legs and underarms, 48 hours prior to surgery.   You may shave your face before/day of surgery.  Place clean sheets on your bed the night before surgery Use a clean washcloth (not used since being washed) for each shower. DO NOT sleep with pet's night before surgery.  CHG Shower Instructions:  Wash your face and private area with normal soap. If you choose to wash your hair, wash first with your normal shampoo.  After you use shampoo/soap, rinse your hair and body thoroughly to remove shampoo/soap residue.  Turn the  water OFF and apply half the bottle of CHG soap to a CLEAN washcloth.  Apply CHG soap ONLY FROM YOUR NECK DOWN TO YOUR TOES (washing for 3-5 minutes)  DO NOT use CHG soap on face, private areas, open wounds, or sores.  Pay special attention to the area where your surgery is being performed.  If you are having back surgery, having someone wash your back for you may be helpful. Wait 2 minutes after CHG soap is applied, then you may rinse off the CHG soap.  Pat dry with a clean towel  Put on clean pajamas    Additional instructions for the day of surgery: DO NOT APPLY any lotions, deodorants, cologne, or perfumes.   Do not wear jewelry or makeup Do not wear nail polish, gel polish, artificial nails, or any  other type of covering on natural nails (fingers and toes) Do not bring valuables to the hospital. [facility] is not responsible for valuables/personal belongings. Put on clean/comfortable clothes.  Please brush your teeth.  Ask your nurse before applying any prescription medications to the skin.

## 2023-12-23 NOTE — Progress Notes (Signed)
 PCP - Dr. Lilyan Punt Cardiologist - Dr. Dina Rich  PPM/ICD - denies Device Orders - na Rep Notified - na  Chest x-ray - na EKG - PAT, 12/23/2023 Stress Test - 04/13/2012 ECHO -  Cardiac Cath -   Sleep Study - denies CPAP - na  Type II diabetic. Blood sugar 113 at PAT.  Last A1C 2023, obtaining one in PAT Fasting Blood Sugar : 130-150 Checks Blood Sugar : twice a week Glipizide, hold evening and morning of dose Lantus, take 20 units night before which is 50% or regular dose Novolog, zero units DOS.  Use 1/2 sliding scale for blood sugar > 220  Blood Thinner Instructions: denies Aspirin Instructions:states last dose 12/18/2023  ERAS Protcol -NPO  Anesthesia review: Yes. HTN, DM, COPD  Patient denies shortness of breath, fever, cough and chest pain at PAT appointment   All instructions explained to the patient, with a verbal understanding of the material. Patient agrees to go over the instructions while at home for a better understanding. Patient also instructed to self quarantine after being tested for COVID-19. The opportunity to ask questions was provided.

## 2023-12-24 ENCOUNTER — Encounter: Payer: Self-pay | Admitting: Family Medicine

## 2023-12-24 NOTE — Progress Notes (Signed)
 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  Patient recently developed intense headaches, double vision, vision loss in the right eye.  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  Currently seen by Dr. Jenne Pane and resection was recommended.  Preop labs reviewed, creatinine mildly elevated 1.52 (peers to be near baseline), otherwise unremarkable.  EKG 12/23/2023: Sinus bradycardia.  Rate 55. Low voltage limb leads. Interpretation limited secondary to artifact. Left axis deviation  Nuclear stress 04/13/2012: Overall Impression:  Normal stress nuclear study.   LV Ejection Fraction: 59%.  LV Wall Motion:  NL LV Function; NL Wall Motion     Zannie Cove [facility] Short Stay Center/Anesthesiology Phone [phone] 12/24/2023 9:59 AM

## 2023-12-24 NOTE — Anesthesia Preprocedure Evaluation (Addendum)
 Anesthesia Evaluation  Patient identified by MRN, date of birth, ID band Patient awake    Reviewed: Allergy & Precautions, H&P , NPO status , Patient's Chart, lab work & pertinent test results  History of Anesthesia Complications (+) history of anesthetic complications  Airway Mallampati: II  TM Distance: >3 FB Neck ROM: full    Dental  (+) Dental Advisory Given, Edentulous Upper, Edentulous Lower   Pulmonary asthma , pneumonia, COPD,  COPD inhaler, neg recent URI, Current SmokerPatient did not abstain from smoking.   Pulmonary exam normal breath sounds clear to auscultation       Cardiovascular Exercise Tolerance: Good hypertension, + CAD and + Peripheral Vascular Disease   Rhythm:regular Rate:Normal     Neuro/Psych  Headaches PSYCHIATRIC DISORDERS  Depression    negative neurological ROS  negative psych ROS   GI/Hepatic Neg liver ROS,GERD  Controlled and Medicated,,  Endo/Other  diabetes    Renal/GU negative Renal ROS     Musculoskeletal  (+) Arthritis ,    Abdominal   Peds  Hematology negative hematology ROS (+)   Anesthesia Other Findings   Reproductive/Obstetrics                              Anesthesia Physical Anesthesia Plan  ASA: 3  Anesthesia Plan: General   Post-op Pain Management: Tylenol PO (pre-op)*   Induction: Intravenous  PONV Risk Score and Plan: 2 and Ondansetron, Treatment may vary due to age or medical condition and Dexamethasone  Airway Management Planned: Oral ETT and Video Laryngoscope Planned  Additional Equipment: Arterial line  Intra-op Plan:   Post-operative Plan: Extubation in OR  Informed Consent:   Plan Discussed with:   Anesthesia Plan Comments: (2 x PIV   PAT note by Antionette Poles, PA-C: 68 year old male with pertinent history including 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  Patient recently developed intense headaches, double vision, vision loss in the right eye.  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  Currently seen by Dr. Jenne Pane and resection was recommended.  Preop labs reviewed, creatinine mildly elevated 1.52 (peers to be near baseline), otherwise unremarkable.  EKG 12/23/2023: Sinus bradycardia.  Rate 55. Low voltage limb leads. Interpretation limited secondary to artifact. Left axis deviation  Nuclear stress 04/13/2012: Overall Impression:  Normal stress nuclear study.   LV Ejection Fraction: 59%.  LV Wall Motion:  NL LV Function; NL Wall Motion   )       Anesthesia Quick Evaluation

## 2023-12-25 ENCOUNTER — Inpatient Hospital Stay (HOSPITAL_COMMUNITY): Payer: Self-pay | Admitting: Anesthesiology

## 2023-12-25 ENCOUNTER — Encounter (HOSPITAL_COMMUNITY)
Admission: RE | Disposition: A | Discharge status: Home / Self Care | Payer: Self-pay | Source: Ambulatory Visit | Attending: Neurosurgery

## 2023-12-25 ENCOUNTER — Other Ambulatory Visit: Payer: Self-pay

## 2023-12-25 ENCOUNTER — Inpatient Hospital Stay (HOSPITAL_COMMUNITY)
Admission: RE | Admit: 2023-12-25 | Discharge: 2023-12-26 | DRG: 614 | Disposition: A | Discharge status: Home / Self Care | Attending: Neurosurgery | Admitting: Neurosurgery

## 2023-12-25 ENCOUNTER — Encounter (HOSPITAL_COMMUNITY): Payer: Self-pay | Admitting: Neurosurgery

## 2023-12-25 DIAGNOSIS — Z79899 Other long term (current) drug therapy: Secondary | ICD-10-CM

## 2023-12-25 DIAGNOSIS — F32A Depression, unspecified: Secondary | ICD-10-CM | POA: Diagnosis present

## 2023-12-25 DIAGNOSIS — H532 Diplopia: Secondary | ICD-10-CM | POA: Diagnosis not present

## 2023-12-25 DIAGNOSIS — Z8546 Personal history of malignant neoplasm of prostate: Secondary | ICD-10-CM | POA: Diagnosis not present

## 2023-12-25 DIAGNOSIS — J439 Emphysema, unspecified: Secondary | ICD-10-CM | POA: Diagnosis not present

## 2023-12-25 DIAGNOSIS — F17210 Nicotine dependence, cigarettes, uncomplicated: Secondary | ICD-10-CM | POA: Diagnosis present

## 2023-12-25 DIAGNOSIS — E872 Acidosis, unspecified: Secondary | ICD-10-CM | POA: Diagnosis present

## 2023-12-25 DIAGNOSIS — J4489 Other specified chronic obstructive pulmonary disease: Secondary | ICD-10-CM | POA: Diagnosis present

## 2023-12-25 DIAGNOSIS — E785 Hyperlipidemia, unspecified: Secondary | ICD-10-CM | POA: Diagnosis not present

## 2023-12-25 DIAGNOSIS — J449 Chronic obstructive pulmonary disease, unspecified: Secondary | ICD-10-CM | POA: Diagnosis not present

## 2023-12-25 DIAGNOSIS — Z720 Tobacco use: Secondary | ICD-10-CM | POA: Diagnosis not present

## 2023-12-25 DIAGNOSIS — E230 Hypopituitarism: Secondary | ICD-10-CM | POA: Diagnosis not present

## 2023-12-25 DIAGNOSIS — Z981 Arthrodesis status: Secondary | ICD-10-CM

## 2023-12-25 DIAGNOSIS — K219 Gastro-esophageal reflux disease without esophagitis: Secondary | ICD-10-CM | POA: Diagnosis present

## 2023-12-25 DIAGNOSIS — Z882 Allergy status to sulfonamides status: Secondary | ICD-10-CM

## 2023-12-25 DIAGNOSIS — Z7984 Long term (current) use of oral hypoglycemic drugs: Secondary | ICD-10-CM | POA: Diagnosis not present

## 2023-12-25 DIAGNOSIS — Z794 Long term (current) use of insulin: Secondary | ICD-10-CM

## 2023-12-25 DIAGNOSIS — E7800 Pure hypercholesterolemia, unspecified: Secondary | ICD-10-CM | POA: Diagnosis present

## 2023-12-25 DIAGNOSIS — E8721 Acute metabolic acidosis: Secondary | ICD-10-CM | POA: Diagnosis not present

## 2023-12-25 DIAGNOSIS — I10 Essential (primary) hypertension: Secondary | ICD-10-CM | POA: Diagnosis present

## 2023-12-25 DIAGNOSIS — N179 Acute kidney failure, unspecified: Secondary | ICD-10-CM | POA: Diagnosis not present

## 2023-12-25 DIAGNOSIS — Z885 Allergy status to narcotic agent status: Secondary | ICD-10-CM

## 2023-12-25 DIAGNOSIS — Z881 Allergy status to other antibiotic agents status: Secondary | ICD-10-CM

## 2023-12-25 DIAGNOSIS — H0520 Unspecified exophthalmos: Secondary | ICD-10-CM | POA: Diagnosis present

## 2023-12-25 DIAGNOSIS — E876 Hypokalemia: Secondary | ICD-10-CM | POA: Diagnosis not present

## 2023-12-25 DIAGNOSIS — Z8249 Family history of ischemic heart disease and other diseases of the circulatory system: Secondary | ICD-10-CM | POA: Diagnosis not present

## 2023-12-25 DIAGNOSIS — Z7982 Long term (current) use of aspirin: Secondary | ICD-10-CM | POA: Diagnosis not present

## 2023-12-25 DIAGNOSIS — M549 Dorsalgia, unspecified: Secondary | ICD-10-CM | POA: Diagnosis present

## 2023-12-25 DIAGNOSIS — D352 Benign neoplasm of pituitary gland: Secondary | ICD-10-CM

## 2023-12-25 DIAGNOSIS — I2510 Atherosclerotic heart disease of native coronary artery without angina pectoris: Secondary | ICD-10-CM | POA: Diagnosis not present

## 2023-12-25 DIAGNOSIS — Z01818 Encounter for other preprocedural examination: Secondary | ICD-10-CM

## 2023-12-25 DIAGNOSIS — E119 Type 2 diabetes mellitus without complications: Secondary | ICD-10-CM | POA: Diagnosis present

## 2023-12-25 DIAGNOSIS — M79606 Pain in leg, unspecified: Secondary | ICD-10-CM | POA: Diagnosis present

## 2023-12-25 HISTORY — PX: ABDOMINAL FAT GRAPH: SHX5709

## 2023-12-25 HISTORY — PX: SINUS ENDO W/FUSION: SHX777

## 2023-12-25 HISTORY — PX: CRANIOTOMY: SHX93

## 2023-12-25 LAB — POCT I-STAT 7, (LYTES, BLD GAS, ICA,H+H)
Acid-base deficit: 8 mmol/L — ABNORMAL HIGH (ref 0.0–2.0)
Acid-base deficit: 9 mmol/L — ABNORMAL HIGH (ref 0.0–2.0)
Bicarbonate: 16.4 mmol/L — ABNORMAL LOW (ref 20.0–28.0)
Bicarbonate: 17.1 mmol/L — ABNORMAL LOW (ref 20.0–28.0)
Calcium, Ion: 0.99 mmol/L — ABNORMAL LOW (ref 1.15–1.40)
Calcium, Ion: 1.02 mmol/L — ABNORMAL LOW (ref 1.15–1.40)
HCT: 25 % — ABNORMAL LOW (ref 39.0–52.0)
HCT: 27 % — ABNORMAL LOW (ref 39.0–52.0)
Hemoglobin: 8.5 g/dL — ABNORMAL LOW (ref 13.0–17.0)
Hemoglobin: 9.2 g/dL — ABNORMAL LOW (ref 13.0–17.0)
O2 Saturation: 100 %
O2 Saturation: 100 %
Patient temperature: 35.2
Patient temperature: 35.8
Potassium: 2.5 mmol/L — CL (ref 3.5–5.1)
Potassium: 2.5 mmol/L — CL (ref 3.5–5.1)
Sodium: 141 mmol/L (ref 135–145)
Sodium: 142 mmol/L (ref 135–145)
TCO2: 17 mmol/L — ABNORMAL LOW (ref 22–32)
TCO2: 18 mmol/L — ABNORMAL LOW (ref 22–32)
pCO2 arterial: 29.1 mmHg — ABNORMAL LOW (ref 32–48)
pCO2 arterial: 29.3 mmHg — ABNORMAL LOW (ref 32–48)
pH, Arterial: 7.348 — ABNORMAL LOW (ref 7.35–7.45)
pH, Arterial: 7.367 (ref 7.35–7.45)
pO2, Arterial: 234 mmHg — ABNORMAL HIGH (ref 83–108)
pO2, Arterial: 236 mmHg — ABNORMAL HIGH (ref 83–108)

## 2023-12-25 LAB — CBC
HCT: 30.2 % — ABNORMAL LOW (ref 39.0–52.0)
Hemoglobin: 10.4 g/dL — ABNORMAL LOW (ref 13.0–17.0)
MCH: 31 pg (ref 26.0–34.0)
MCHC: 34.4 g/dL (ref 30.0–36.0)
MCV: 90.1 fL (ref 80.0–100.0)
Platelets: 161 10*3/uL (ref 150–400)
RBC: 3.35 MIL/uL — ABNORMAL LOW (ref 4.22–5.81)
RDW: 13.7 % (ref 11.5–15.5)
WBC: 5.1 10*3/uL (ref 4.0–10.5)
nRBC: 0 % (ref 0.0–0.2)

## 2023-12-25 LAB — BASIC METABOLIC PANEL
Anion gap: 7 (ref 5–15)
Anion gap: 12 (ref 5–15)
BUN: 13 mg/dL (ref 8–23)
BUN: 14 mg/dL (ref 8–23)
CO2: 18 mmol/L — ABNORMAL LOW (ref 22–32)
CO2: 21 mmol/L — ABNORMAL LOW (ref 22–32)
Calcium: 6.5 mg/dL — ABNORMAL LOW (ref 8.9–10.3)
Calcium: 9 mg/dL (ref 8.9–10.3)
Chloride: 114 mmol/L — ABNORMAL HIGH (ref 98–111)
Chloride: 103 mmol/L (ref 98–111)
Creatinine, Ser: 0.99 mg/dL (ref 0.61–1.24)
Creatinine, Ser: 1.28 mg/dL — ABNORMAL HIGH (ref 0.61–1.24)
GFR, Estimated: >60 mL/min (ref >60)
GFR, Estimated: >60 mL/min (ref >60)
Glucose, Bld: 94 mg/dL (ref 70–99)
Glucose, Bld: 185 mg/dL — ABNORMAL HIGH (ref 70–99)
Potassium: 2.4 mmol/L — CL (ref 3.5–5.1)
Potassium: 4.1 mmol/L (ref 3.5–5.1)
Sodium: 139 mmol/L (ref 135–145)
Sodium: 136 mmol/L (ref 135–145)

## 2023-12-25 LAB — GLUCOSE, CAPILLARY
Glucose-Capillary: 107 mg/dL — ABNORMAL HIGH (ref 70–99)
Glucose-Capillary: 127 mg/dL — ABNORMAL HIGH (ref 70–99)
Glucose-Capillary: 180 mg/dL — ABNORMAL HIGH (ref 70–99)
Glucose-Capillary: 203 mg/dL — ABNORMAL HIGH (ref 70–99)
Glucose-Capillary: 204 mg/dL — ABNORMAL HIGH (ref 70–99)

## 2023-12-25 LAB — ABO/RH: ABO/RH(D): O POS

## 2023-12-25 SURGERY — CRANIOTOMY HYPOPHYSECTOMY TRANSNASAL APPROACH
Anesthesia: General

## 2023-12-25 MED ORDER — ONDANSETRON HCL 4 MG/2ML IJ SOLN
INTRAMUSCULAR | Status: DC | PRN
  Administered 2023-12-25: 4 mg via INTRAVENOUS

## 2023-12-25 MED ORDER — VASOPRESSIN 20 UNIT/ML IV SOLN
INTRAVENOUS | Status: AC
  Filled 2023-12-25: qty 1

## 2023-12-25 MED ORDER — DEXAMETHASONE SODIUM PHOSPHATE 10 MG/ML IJ SOLN
INTRAMUSCULAR | Status: AC
  Filled 2023-12-25: qty 1

## 2023-12-25 MED ORDER — NICOTINE 21 MG/24HR TD PT24
21.0000 mg | MEDICATED_PATCH | Freq: Every day | TRANSDERMAL | Status: DC
  Administered 2023-12-25 – 2023-12-26 (×2): 21 mg via TRANSDERMAL
  Filled 2023-12-25 (×2): qty 1

## 2023-12-25 MED ORDER — PRAVASTATIN SODIUM 40 MG PO TABS
40.0000 mg | ORAL_TABLET | Freq: Every day | ORAL | Status: DC
  Administered 2023-12-26: 40 mg via ORAL
  Filled 2023-12-25: qty 1

## 2023-12-25 MED ORDER — LOSARTAN POTASSIUM 50 MG PO TABS
50.0000 mg | ORAL_TABLET | Freq: Every day | ORAL | Status: DC
  Administered 2023-12-26: 50 mg via ORAL
  Filled 2023-12-25: qty 1

## 2023-12-25 MED ORDER — SALINE SPRAY 0.65 % NA SOLN
2.0000 | NASAL | Status: DC
  Administered 2023-12-25 – 2023-12-26 (×8): 2 via NASAL
  Filled 2023-12-25: qty 44

## 2023-12-25 MED ORDER — LIDOCAINE 2% (20 MG/ML) 5 ML SYRINGE
INTRAMUSCULAR | Status: DC | PRN
  Administered 2023-12-25: 100 mg via INTRAVENOUS

## 2023-12-25 MED ORDER — MUPIROCIN CALCIUM 2 % EX CREA
TOPICAL_CREAM | CUTANEOUS | Status: AC
  Filled 2023-12-25: qty 15

## 2023-12-25 MED ORDER — OXYMETAZOLINE HCL 0.05 % NA SOLN
NASAL | Status: DC | PRN
  Administered 2023-12-25: 1 via TOPICAL

## 2023-12-25 MED ORDER — ACETAMINOPHEN 325 MG PO TABS
650.0000 mg | ORAL_TABLET | Freq: Four times a day (QID) | ORAL | Status: DC | PRN
  Administered 2023-12-25 – 2023-12-26 (×2): 650 mg via ORAL
  Filled 2023-12-25 (×3): qty 2

## 2023-12-25 MED ORDER — INSULIN ASPART 100 UNIT/ML IJ SOLN
0.0000 [IU] | Freq: Three times a day (TID) | INTRAMUSCULAR | Status: DC
  Administered 2023-12-25: 3 [IU] via SUBCUTANEOUS
  Administered 2023-12-25: 5 [IU] via SUBCUTANEOUS
  Administered 2023-12-26: 3 [IU] via SUBCUTANEOUS

## 2023-12-25 MED ORDER — PROPOFOL 10 MG/ML IV BOLUS
INTRAVENOUS | Status: AC
  Filled 2023-12-25: qty 20

## 2023-12-25 MED ORDER — ACETAMINOPHEN 500 MG PO TABS
1000.0000 mg | ORAL_TABLET | Freq: Once | ORAL | Status: AC
  Administered 2023-12-25: 1000 mg via ORAL
  Filled 2023-12-25: qty 2

## 2023-12-25 MED ORDER — ESMOLOL HCL 100 MG/10ML IV SOLN
INTRAVENOUS | Status: AC
  Filled 2023-12-25: qty 10

## 2023-12-25 MED ORDER — CHLORHEXIDINE GLUCONATE CLOTH 2 % EX PADS: 6.0000 | MEDICATED_PAD | Freq: Once | CUTANEOUS | Status: DC

## 2023-12-25 MED ORDER — ROCURONIUM BROMIDE 10 MG/ML (PF) SYRINGE
PREFILLED_SYRINGE | INTRAVENOUS | Status: DC | PRN
Start: 2023-12-25 — End: 2023-12-25
  Administered 2023-12-25: 70 mg via INTRAVENOUS
  Administered 2023-12-25: 30 mg via INTRAVENOUS

## 2023-12-25 MED ORDER — DROPERIDOL 2.5 MG/ML IJ SOLN: 0.6250 mg | Freq: Once | INTRAMUSCULAR | Status: DC | PRN

## 2023-12-25 MED ORDER — THROMBIN 5000 UNITS EX SOLR
OROMUCOSAL | Status: DC | PRN
  Administered 2023-12-25: 5 mL via TOPICAL

## 2023-12-25 MED ORDER — MOMETASONE FURO-FORMOTEROL FUM 200-5 MCG/ACT IN AERO
2.0000 | INHALATION_SPRAY | Freq: Two times a day (BID) | RESPIRATORY_TRACT | Status: DC
  Administered 2023-12-26: 2 via RESPIRATORY_TRACT
  Filled 2023-12-25: qty 8.8

## 2023-12-25 MED ORDER — SODIUM CHLORIDE 0.9 % IR SOLN
Status: DC | PRN
  Administered 2023-12-25 (×2): 1000 mL

## 2023-12-25 MED ORDER — ALBUTEROL SULFATE (2.5 MG/3ML) 0.083% IN NEBU: 3.0000 mL | INHALATION_SOLUTION | RESPIRATORY_TRACT | Status: DC | PRN

## 2023-12-25 MED ORDER — SUGAMMADEX SODIUM 200 MG/2ML IV SOLN
INTRAVENOUS | Status: DC | PRN
Start: 2023-12-25 — End: 2023-12-25
  Administered 2023-12-25: 400 mg via INTRAVENOUS

## 2023-12-25 MED ORDER — PHENYLEPHRINE HCL-NACL 20-0.9 MG/250ML-% IV SOLN
INTRAVENOUS | Status: DC | PRN
  Administered 2023-12-25: 30 ug/min via INTRAVENOUS

## 2023-12-25 MED ORDER — POTASSIUM CHLORIDE 10 MEQ/100ML IV SOLN
INTRAVENOUS | Status: AC
  Filled 2023-12-25: qty 200

## 2023-12-25 MED ORDER — MUPIROCIN 2 % EX OINT
TOPICAL_OINTMENT | CUTANEOUS | Status: AC
  Filled 2023-12-25: qty 22

## 2023-12-25 MED ORDER — FENTANYL CITRATE (PF) 100 MCG/2ML IJ SOLN
25.0000 ug | INTRAMUSCULAR | Status: DC | PRN
  Administered 2023-12-25: 25 ug via INTRAVENOUS
  Administered 2023-12-25: 50 ug via INTRAVENOUS
  Administered 2023-12-25: 25 ug via INTRAVENOUS
  Administered 2023-12-25: 50 ug via INTRAVENOUS

## 2023-12-25 MED ORDER — LIDOCAINE 2% (20 MG/ML) 5 ML SYRINGE
INTRAMUSCULAR | Status: AC
  Filled 2023-12-25: qty 5

## 2023-12-25 MED ORDER — CLEVIDIPINE BUTYRATE 0.5 MG/ML IV EMUL
INTRAVENOUS | Status: AC
Start: 2023-12-25 — End: 2023-12-25
  Filled 2023-12-25: qty 100

## 2023-12-25 MED ORDER — SODIUM CHLORIDE 0.9 % IV SOLN: INTRAVENOUS | Status: DC | PRN

## 2023-12-25 MED ORDER — ENSURE ENLIVE PO LIQD
237.0000 mL | Freq: Two times a day (BID) | ORAL | Status: DC
  Administered 2023-12-26: 237 mL via ORAL

## 2023-12-25 MED ORDER — THROMBIN 5000 UNITS EX KIT
PACK | CUTANEOUS | Status: AC
  Filled 2023-12-25: qty 1

## 2023-12-25 MED ORDER — HEMOSTATIC AGENTS (NO CHARGE) OPTIME
TOPICAL | Status: DC | PRN
Start: 2023-12-25 — End: 2023-12-25
  Administered 2023-12-25: 1 via TOPICAL

## 2023-12-25 MED ORDER — ALBUMIN HUMAN 5 % IV SOLN: INTRAVENOUS | Status: DC | PRN

## 2023-12-25 MED ORDER — PANTOPRAZOLE SODIUM 40 MG PO TBEC
40.0000 mg | DELAYED_RELEASE_TABLET | Freq: Two times a day (BID) | ORAL | Status: DC
  Administered 2023-12-25 – 2023-12-26 (×2): 40 mg via ORAL
  Filled 2023-12-25 (×2): qty 1

## 2023-12-25 MED ORDER — SUGAMMADEX SODIUM 200 MG/2ML IV SOLN
INTRAVENOUS | Status: AC
  Filled 2023-12-25: qty 4

## 2023-12-25 MED ORDER — SUCCINYLCHOLINE CHLORIDE 200 MG/10ML IV SOSY
PREFILLED_SYRINGE | INTRAVENOUS | Status: AC
  Filled 2023-12-25: qty 10

## 2023-12-25 MED ORDER — PROPOFOL 10 MG/ML IV BOLUS
INTRAVENOUS | Status: DC | PRN
  Administered 2023-12-25: 150 mg via INTRAVENOUS
  Administered 2023-12-25: 50 mg via INTRAVENOUS

## 2023-12-25 MED ORDER — CHLORHEXIDINE GLUCONATE 0.12 % MT SOLN
15.0000 mL | Freq: Once | OROMUCOSAL | Status: AC
  Administered 2023-12-25: 15 mL via OROMUCOSAL
  Filled 2023-12-25: qty 15

## 2023-12-25 MED ORDER — DEXAMETHASONE SODIUM PHOSPHATE 10 MG/ML IJ SOLN
INTRAMUSCULAR | Status: DC | PRN
  Administered 2023-12-25: 4 mg via INTRAVENOUS

## 2023-12-25 MED ORDER — CLEVIDIPINE BUTYRATE 0.5 MG/ML IV EMUL
INTRAVENOUS | Status: DC | PRN
  Administered 2023-12-25: 1 mg/h via INTRAVENOUS

## 2023-12-25 MED ORDER — MUPIROCIN 2 % EX OINT
TOPICAL_OINTMENT | CUTANEOUS | Status: DC | PRN
  Administered 2023-12-25: 1 via TOPICAL

## 2023-12-25 MED ORDER — PHENYLEPHRINE HCL-NACL 20-0.9 MG/250ML-% IV SOLN
INTRAVENOUS | Status: AC
  Filled 2023-12-25: qty 250

## 2023-12-25 MED ORDER — LEVOTHYROXINE SODIUM 25 MCG PO TABS
25.0000 ug | ORAL_TABLET | Freq: Every day | ORAL | Status: DC
  Administered 2023-12-26: 25 ug via ORAL
  Filled 2023-12-25: qty 1

## 2023-12-25 MED ORDER — 0.9 % SODIUM CHLORIDE (POUR BTL) OPTIME
TOPICAL | Status: DC | PRN
  Administered 2023-12-25: 1000 mL

## 2023-12-25 MED ORDER — ROCURONIUM BROMIDE 10 MG/ML (PF) SYRINGE
PREFILLED_SYRINGE | INTRAVENOUS | Status: AC
  Filled 2023-12-25: qty 10

## 2023-12-25 MED ORDER — ORAL CARE MOUTH RINSE: 15.0000 mL | Freq: Once | OROMUCOSAL | Status: AC

## 2023-12-25 MED ORDER — LABETALOL HCL 5 MG/ML IV SOLN
10.0000 mg | INTRAVENOUS | Status: DC | PRN
  Administered 2023-12-25: 10 mg via INTRAVENOUS
  Administered 2023-12-25: 40 mg via INTRAVENOUS
  Filled 2023-12-25: qty 8
  Filled 2023-12-25: qty 4

## 2023-12-25 MED ORDER — CALCIUM GLUCONATE-NACL 1-0.675 GM/50ML-% IV SOLN
1.0000 g | Freq: Once | INTRAVENOUS | Status: AC
  Administered 2023-12-25: 1000 mg via INTRAVENOUS
  Filled 2023-12-25: qty 50

## 2023-12-25 MED ORDER — HYDROCORTISONE 20 MG PO TABS
20.0000 mg | ORAL_TABLET | Freq: Every morning | ORAL | Status: DC
  Administered 2023-12-26: 20 mg via ORAL
  Filled 2023-12-25: qty 1

## 2023-12-25 MED ORDER — GLIPIZIDE 10 MG PO TABS
10.0000 mg | ORAL_TABLET | Freq: Two times a day (BID) | ORAL | Status: DC
  Administered 2023-12-25 – 2023-12-26 (×2): 10 mg via ORAL
  Filled 2023-12-25 (×3): qty 1

## 2023-12-25 MED ORDER — ONDANSETRON HCL 4 MG/2ML IJ SOLN
INTRAMUSCULAR | Status: AC
  Filled 2023-12-25: qty 2

## 2023-12-25 MED ORDER — ONDANSETRON HCL 4 MG/2ML IJ SOLN: 4.0000 mg | INTRAMUSCULAR | Status: DC | PRN

## 2023-12-25 MED ORDER — CHLORHEXIDINE GLUCONATE CLOTH 2 % EX PADS
6.0000 | MEDICATED_PAD | Freq: Every day | CUTANEOUS | Status: DC
  Administered 2023-12-25 – 2023-12-26 (×2): 6 via TOPICAL

## 2023-12-25 MED ORDER — FENTANYL CITRATE (PF) 100 MCG/2ML IJ SOLN
INTRAMUSCULAR | Status: AC
  Filled 2023-12-25: qty 2

## 2023-12-25 MED ORDER — FENTANYL CITRATE (PF) 250 MCG/5ML IJ SOLN
INTRAMUSCULAR | Status: AC
  Filled 2023-12-25: qty 5

## 2023-12-25 MED ORDER — FENTANYL CITRATE (PF) 250 MCG/5ML IJ SOLN
INTRAMUSCULAR | Status: DC | PRN
  Administered 2023-12-25: 100 ug via INTRAVENOUS
  Administered 2023-12-25 (×2): 50 ug via INTRAVENOUS

## 2023-12-25 MED ORDER — HEMOSTATIC AGENTS (NO CHARGE) OPTIME
TOPICAL | Status: DC | PRN
  Administered 2023-12-25: 1 via TOPICAL

## 2023-12-25 MED ORDER — EPINEPHRINE HCL (NASAL) 0.1 % NA SOLN
NASAL | Status: AC
  Filled 2023-12-25: qty 90

## 2023-12-25 MED ORDER — CEFAZOLIN SODIUM-DEXTROSE 2-4 GM/100ML-% IV SOLN
2.0000 g | INTRAVENOUS | Status: AC
  Administered 2023-12-25: 2 g via INTRAVENOUS
  Filled 2023-12-25: qty 100

## 2023-12-25 MED ORDER — ONDANSETRON HCL 4 MG PO TABS: 4.0000 mg | ORAL_TABLET | ORAL | Status: DC | PRN

## 2023-12-25 MED ORDER — PHENYLEPHRINE 80 MCG/ML (10ML) SYRINGE FOR IV PUSH (FOR BLOOD PRESSURE SUPPORT)
PREFILLED_SYRINGE | INTRAVENOUS | Status: AC
  Filled 2023-12-25: qty 10

## 2023-12-25 MED ORDER — LACTATED RINGERS IV SOLN
INTRAVENOUS | Status: DC | PRN
Start: 2023-12-25 — End: 2023-12-25

## 2023-12-25 MED ORDER — POTASSIUM CHLORIDE 10 MEQ/100ML IV SOLN
10.0000 meq | INTRAVENOUS | Status: AC
Start: 2023-12-25 — End: 2023-12-25
  Administered 2023-12-25 (×4): 10 meq via INTRAVENOUS

## 2023-12-25 MED ORDER — LIDOCAINE-EPINEPHRINE 1 %-1:100000 IJ SOLN
INTRAMUSCULAR | Status: AC
  Filled 2023-12-25: qty 1

## 2023-12-25 MED ORDER — LIDOCAINE-EPINEPHRINE 1 %-1:100000 IJ SOLN
INTRAMUSCULAR | Status: DC | PRN
  Administered 2023-12-25: 6 mL

## 2023-12-25 MED ORDER — PANTOPRAZOLE SODIUM 40 MG PO TBEC: 40.0000 mg | DELAYED_RELEASE_TABLET | Freq: Every day | ORAL | Status: DC

## 2023-12-25 MED ORDER — CLEVIDIPINE BUTYRATE 0.5 MG/ML IV EMUL
0.0000 mg/h | INTRAVENOUS | Status: DC
  Administered 2023-12-25: 5 mg/h via INTRAVENOUS
  Administered 2023-12-25: 2 mg/h via INTRAVENOUS

## 2023-12-25 MED ORDER — HYDROCORTISONE SOD SUC (PF) 100 MG IJ SOLR
INTRAMUSCULAR | Status: DC | PRN
  Administered 2023-12-25: 100 mg via INTRAVENOUS

## 2023-12-25 MED ORDER — PHENYLEPHRINE 80 MCG/ML (10ML) SYRINGE FOR IV PUSH (FOR BLOOD PRESSURE SUPPORT)
PREFILLED_SYRINGE | INTRAVENOUS | Status: DC | PRN
  Administered 2023-12-25: 80 ug via INTRAVENOUS

## 2023-12-25 MED ORDER — HYDROCORTISONE 10 MG PO TABS
10.0000 mg | ORAL_TABLET | Freq: Every evening | ORAL | Status: DC
  Administered 2023-12-25: 10 mg via ORAL
  Filled 2023-12-25 (×2): qty 1

## 2023-12-25 MED ORDER — OXYMETAZOLINE HCL 0.05 % NA SOLN
NASAL | Status: AC
  Filled 2023-12-25: qty 30

## 2023-12-25 MED ORDER — SUCRALFATE 1 G PO TABS
1.0000 g | ORAL_TABLET | Freq: Two times a day (BID) | ORAL | Status: DC
  Administered 2023-12-25 – 2023-12-26 (×2): 1 g via ORAL
  Filled 2023-12-25 (×4): qty 1

## 2023-12-25 MED ORDER — DIPHENOXYLATE-ATROPINE 2.5-0.025 MG PO TABS
1.0000 | ORAL_TABLET | Freq: Two times a day (BID) | ORAL | Status: DC | PRN
  Filled 2023-12-25: qty 1

## 2023-12-25 MED ORDER — PROMETHAZINE HCL 12.5 MG PO TABS
12.5000 mg | ORAL_TABLET | ORAL | Status: DC | PRN
  Filled 2023-12-25: qty 2

## 2023-12-25 SURGICAL SUPPLY — 79 items
BAG COUNTER SPONGE SURGICOUNT (BAG) ×2 IMPLANT
BENZOIN TINCTURE PRP APPL 2/3 (GAUZE/BANDAGES/DRESSINGS) ×1 IMPLANT
BLADE RAD40 ROTATE 4M 4 5PK (BLADE) IMPLANT
BLADE RAD60 ROTATE M4 4 5PK (BLADE) IMPLANT
BLADE ROTATE TRICUT 4X13 M4 (BLADE) ×1 IMPLANT
BLADE SURG 11 STRL SS (BLADE) ×2 IMPLANT
BLADE TRICUT ROTATE M4 4 5PK (BLADE) ×1 IMPLANT
BUR TAPER CHOANAL ATRESIA 30K (BURR) IMPLANT
CANISTER SUCT 3000ML PPV (MISCELLANEOUS) ×2 IMPLANT
CLSR STERI-STRIP ANTIMIC 1/2X4 (GAUZE/BANDAGES/DRESSINGS) ×1 IMPLANT
COAGULATOR SUCT SWTCH 10FR 6 (ELECTROSURGICAL) IMPLANT
COVER BACK TABLE 60X90IN (DRAPES) IMPLANT
DRAPE HALF SHEET 40X57 (DRAPES) ×2 IMPLANT
DRAPE INCISE IOBAN 66X45 STRL (DRAPES) ×1 IMPLANT
DRAPE MICROSCOPE SLANT 54X150 (MISCELLANEOUS) IMPLANT
DRESSING NASAL POPE 10X1.5X2.5 (GAUZE/BANDAGES/DRESSINGS) IMPLANT
DRSG NASAL POPE 10X1.5X2.5 (GAUZE/BANDAGES/DRESSINGS) IMPLANT
DRSG NASOPORE 8CM (GAUZE/BANDAGES/DRESSINGS) IMPLANT
DRSG TELFA 3X8 NADH STRL (GAUZE/BANDAGES/DRESSINGS) IMPLANT
DURAPREP 26ML APPLICATOR (WOUND CARE) ×1 IMPLANT
ELECT NDL TIP 2.8 STRL (NEEDLE) ×1 IMPLANT
ELECT NEEDLE TIP 2.8 STRL (NEEDLE) IMPLANT
ELECT REM PT RETURN 9FT ADLT (ELECTROSURGICAL) ×1 IMPLANT
ELECTRODE REM PT RTRN 9FT ADLT (ELECTROSURGICAL) ×1 IMPLANT
GAUZE PACKING FOLDED 2 STR (GAUZE/BANDAGES/DRESSINGS) ×1 IMPLANT
GAUZE SPONGE 2X2 8PLY STRL LF (GAUZE/BANDAGES/DRESSINGS) ×1 IMPLANT
GAUZE SPONGE 4X4 12PLY STRL (GAUZE/BANDAGES/DRESSINGS) ×1 IMPLANT
GLOVE BIO SURGEON STRL SZ7.5 (GLOVE) ×1 IMPLANT
GLOVE BIOGEL PI IND STRL 7.5 (GLOVE) ×1 IMPLANT
GLOVE ECLIPSE 7.0 STRL STRAW (GLOVE) ×1 IMPLANT
GLOVE EXAM NITRILE XL STR (GLOVE) IMPLANT
GOWN STRL REUS W/ TWL LRG LVL3 (GOWN DISPOSABLE) ×2 IMPLANT
GOWN STRL REUS W/ TWL XL LVL3 (GOWN DISPOSABLE) IMPLANT
HEMOSTAT POWDER KIT SURGIFOAM (HEMOSTASIS) ×1 IMPLANT
HEMOSTAT SURGICEL 2X14 (HEMOSTASIS) IMPLANT
KIT BASIN OR (CUSTOM PROCEDURE TRAY) ×2 IMPLANT
KIT DRAIN CSF ACCUDRAIN (MISCELLANEOUS) IMPLANT
KIT TURNOVER KIT B (KITS) ×2 IMPLANT
KNIFE ARACHNOID DISP AM-21-S (BLADE) IMPLANT
NDL HYPO 25GX1X1/2 BEV (NEEDLE) IMPLANT
NDL HYPO 25X1 1.5 SAFETY (NEEDLE) ×1 IMPLANT
NDL PRECISIONGLIDE 27X1.5 (NEEDLE) ×1 IMPLANT
NDL SPNL 22GX3.5 QUINCKE BK (NEEDLE) ×2 IMPLANT
NDL SPNL 25GX3.5 QUINCKE BL (NEEDLE) IMPLANT
NEEDLE HYPO 25GX1X1/2 BEV (NEEDLE) ×1 IMPLANT
NEEDLE HYPO 25X1 1.5 SAFETY (NEEDLE) IMPLANT
NEEDLE PRECISIONGLIDE 27X1.5 (NEEDLE) IMPLANT
NEEDLE SPNL 22GX3.5 QUINCKE BK (NEEDLE) IMPLANT
NEEDLE SPNL 25GX3.5 QUINCKE BL (NEEDLE) ×1 IMPLANT
NS IRRIG 1000ML POUR BTL (IV SOLUTION) ×2 IMPLANT
PACK LAMINECTOMY NEURO (CUSTOM PROCEDURE TRAY) ×1 IMPLANT
PAD ARMBOARD POSITIONER FOAM (MISCELLANEOUS) ×3 IMPLANT
PAD ENT ADHESIVE 25PK (MISCELLANEOUS) ×1 IMPLANT
PATTIES SURGICAL .25X.25 (GAUZE/BANDAGES/DRESSINGS) IMPLANT
PATTIES SURGICAL .5 X.5 (GAUZE/BANDAGES/DRESSINGS) IMPLANT
PATTIES SURGICAL .5 X3 (DISPOSABLE) ×2 IMPLANT
POSITIONER HEAD DONUT 9IN (MISCELLANEOUS) IMPLANT
SEALANT ADHERUS EXTEND TIP (MISCELLANEOUS) IMPLANT
SHEATH ENDOSCRUB 0 DEG (SHEATH) ×1 IMPLANT
SHEATH ENDOSCRUB 30 DEG (SHEATH) ×1 IMPLANT
SHEATH ENDOSCRUB 45 DEG (SHEATH) IMPLANT
SOL ANTI FOG 6CC (MISCELLANEOUS) ×1 IMPLANT
SPLINT NASAL POSISEP X .6X2 (GAUZE/BANDAGES/DRESSINGS) IMPLANT
SPONGE SURGIFOAM ABS GEL 12-7 (HEMOSTASIS) IMPLANT
STAPLER VISISTAT (STAPLE) ×1 IMPLANT
STRIP CLOSURE SKIN 1/2X4 (GAUZE/BANDAGES/DRESSINGS) ×1 IMPLANT
SUT ETHILON 3 0 PS 1 (SUTURE) IMPLANT
SWAB COLLECTION DEVICE MRSA (MISCELLANEOUS) IMPLANT
SYR 50ML SLIP (SYRINGE) IMPLANT
TOWEL GREEN STERILE (TOWEL DISPOSABLE) ×1 IMPLANT
TOWEL GREEN STERILE FF (TOWEL DISPOSABLE) ×2 IMPLANT
TRACKER ENT INSTRUMENT (MISCELLANEOUS) ×1 IMPLANT
TRACKER ENT PATIENT (MISCELLANEOUS) ×1 IMPLANT
TRAY ENT MC OR (CUSTOM PROCEDURE TRAY) ×1 IMPLANT
TRAY FOLEY MTR SLVR 16FR STAT (SET/KITS/TRAYS/PACK) ×1 IMPLANT
TUBE CONNECTING 12X1/4 (SUCTIONS) ×1 IMPLANT
TUBING FEATHERFLOW (TUBING) IMPLANT
TUBING STRAIGHTSHOT EPS 5PK (TUBING) IMPLANT
WATER STERILE IRR 1000ML POUR (IV SOLUTION) ×1 IMPLANT

## 2023-12-25 NOTE — Progress Notes (Addendum)
 eLink Physician-Brief Progress Note Patient Name: Roger Phillips DOB: 01-24-1956 MRN: [id number]   Date of Service  12/25/2023  HPI/Events of Note  RN reports patient is complaining of a 3 out of 10 headache as well as chronic low back and leg pain.  Requesting Tylenol.  eICU Interventions  Order placed.     Intervention Category Intermediate Interventions: Pain - evaluation and management  Carilyn Goodpasture 12/25/2023, 9:04 PM  Addendum: Patient hypertensive despite receiving as needed labetalol.  SBP goal is 120-160.  As needed hydralazine ordered. -------------------------------- Complaining of back pain.  Not relieved by Tylenol. Will order lidocaine patch for topical application to see if it helps.

## 2023-12-25 NOTE — H&P (Signed)
 Chief Complaint   Tumor  History of Present Illness  Roger Phillips is a 68 y.o. male initially presenting to the outpatient neurosurgery clinic with rather acute onset of diplopia and proptosis as well as headache.  His workup included MRI which did demonstrate a pituitary tumor with cavernous sinus extension and some suprasellar extension.  Interestingly, there was no evidence of diffusion restriction or hemorrhage within the tumor to suggest apoplexy.  Patient's serum endocrine profile did demonstrate inappropriately normal TSH in the setting of low free T4, and low a.m. cortisol suggesting mild hypopituitarism.  Patient presents today for endoscopic transsphenoidal resection of tumor.  Past Medical History   Past Medical History:  Diagnosis Date   Arthritis    neck and left shoulder   Asthma    Back pain    buldging disc   Complication of anesthesia    difficult to wake up after neck surgery   COPD (chronic obstructive pulmonary disease) (HCC)    Depression    takes Wellbutrin   Diabetes mellitus    takes Metformin bid   Emphysema    GERD (gastroesophageal reflux disease)    takes Omeprazole daily   Hemorrhoids    High cholesterol    takes Pravastatin daily   HTN (hypertension)    takes Lisinopril,Norvasc,and HCTZ daily   IBS (irritable bowel syndrome)    Insomnia    doesn't take any meds    Pneumonia    hx of in 80's and 90's   Prostate cancer (HCC)    Sinus headache    sinus drainage    Past Surgical History   Past Surgical History:  Procedure Laterality Date   ANTERIOR CERVICAL DECOMP/DISCECTOMY FUSION  12/04/2011   Procedure: ANTERIOR CERVICAL DECOMPRESSION/DISCECTOMY FUSION 2 LEVELS;  Surgeon: Hewitt Shorts, MD;  Location: [facility];  Service: Neurosurgery;  Laterality: N/A;  Cervical Three-Four, Cervical Four-Five anterior cervial decompression with fusion plating and bonegraft   CARPAL TUNNEL RELEASE  10+yrs   right side   COLONOSCOPY N/A 04/19/2014    Procedure: COLONOSCOPY;  Surgeon: Malissa Hippo, MD;  Location: [facility];  Service: Endoscopy;  Laterality: N/A;  1030-moved to 1230 Ann to notify pt   CYSTECTOMY  12-60yrs ago   from left side of neck   CYSTOSCOPY WITH URETHRAL DILATATION N/A 09/22/2022   Procedure: CYSTOSCOPY WITH URETHRAL DILATATION-optilume;  Surgeon: Malen Gauze, MD;  Location: [facility];  Service: Urology;  Laterality: N/A;   GOLD SEED IMPLANT N/A 12/06/2020   Procedure: GOLD SEED IMPLANT;  Surgeon: Malen Gauze, MD;  Location: [facility];  Service: Urology;  Laterality: N/A;   SPACE OAR INSTILLATION N/A 12/06/2020   Procedure: SPACE OAR INSTILLATION;  Surgeon: Malen Gauze, MD;  Location: [facility];  Service: Urology;  Laterality: N/A;    Social History   Social History   Tobacco Use   Smoking status: Every Day    Current packs/day: 1.50    Average packs/day: 1.5 packs/day for 40.0 years (60.0 ttl pk-yrs)    Types: Cigarettes   Smokeless tobacco: Never  Vaping Use   Vaping status: Never Used  Substance Use Topics   Alcohol use: Yes    Alcohol/week: 6.0 standard drinks of alcohol    Types: 6 Shots of liquor per week   Drug use: No    Medications   Prior to Admission medications   Medication Sig Start Date End Date Taking? Authorizing Provider  albuterol (VENTOLIN HFA) 108 (90 Base) MCG/ACT  inhaler 2 puff q 4 prn Patient taking differently: Inhale 2 puffs into the lungs every 4 (four) hours as needed for wheezing or shortness of breath. 2 puff q 4 prn 01/14/23  Yes Luking, Scott A, MD  aspirin EC 81 MG tablet Take 81 mg by mouth daily. Swallow whole.   Yes [provider]  diphenhydrAMINE (BENADRYL) 25 MG tablet Take 25 mg by mouth daily.   Yes [provider]  diphenoxylate-atropine (LOMOTIL) 2.5-0.025 MG tablet TAKE ONE TABLET BY MOUTH TWICE A DAY AS NEEDED FOR DIARRHEA DUE TO PROSTATE RADIATION. 08/27/23  Yes Luking, Jonna Coup, MD  glipiZIDE (GLUCOTROL) 10 MG tablet TAKE  ONE  TABLET BY MOUTH TWICE A DAY 08/05/23  Yes Luking, Scott A, MD  losartan (COZAAR) 50 MG tablet TAKE 1 TABLET BY MOUTH DAILY. 10/26/23  Yes Luking, Jonna Coup, MD  omeprazole (PRILOSEC) 20 MG capsule TAKE 1 TABLET BY MOUTH TWICE DAILY FOR GERD. 10/02/23  Yes Babs Sciara, MD  pravastatin (PRAVACHOL) 40 MG tablet Take 1 tablet (40 mg total) by mouth daily. 01/14/23  Yes Luking, Scott A, MD  sucralfate (CARAFATE) 1 g tablet TAKE ONE (1) TABLET BY MOUTH 3 TIMES DAILY FOR GERD. Patient taking differently: Take 1 g by mouth 2 (two) times daily. 01/27/22  Yes Luking, Jonna Coup, MD  WIXELA INHUB 250-50 MCG/ACT AEPB INHALE ONE PUFF BY MOUTH TWICE DAILY. 11/03/23  Yes Babs Sciara, MD  ACCU-CHEK SOFTCLIX LANCETS lancets  11/15/13   [provider]  Alcohol Swabs (B-D SINGLE USE SWABS REGULAR) PADS  11/15/13   [provider]  Blood Glucose Calibration (ACCU-CHEK AVIVA) SOLN  11/15/13   [provider]  Blood Glucose Monitoring Suppl (ACCU-CHEK AVIVA PLUS) W/DEVICE KIT  11/15/13   [provider]  FREESTYLE LITE test strip USE TO TEST BLOOD GLUCOSE THREE TIMES DAILY 11/07/20   Babs Sciara, MD  Lancets (FREESTYLE) lancets Use to check blood sugar TID 11/08/20   Babs Sciara, MD    Allergies   Allergies  Allergen Reactions   Doxycycline Diarrhea   Morphine And Codeine Other (See Comments)    Insomnia    Sulfa Antibiotics Rash    Review of Systems  ROS  Neurologic Exam  Awake, alert, oriented Memory and concentration grossly intact Speech fluent, appropriate Right proptosis, ptosis, partial ophthalmoplegia Motor exam: Upper Extremities Deltoid Bicep Tricep Grip  Right 5/5 5/5 5/5 5/5  Left 5/5 5/5 5/5 5/5   Lower Extremities IP Quad PF DF EHL  Right 5/5 5/5 5/5 5/5 5/5  Left 5/5 5/5 5/5 5/5 5/5   Sensation grossly intact to LT  Imaging  MRI demonstrates tumor within the sella and R>L cavernous sinus with suprasellar extension  Impression  - 68  y.o. 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  Plan  - Will proceed with endoscopic transsphenoidal resection of tumor  I have reviewed the indications for the procedure as well as the details of the procedure and the expected postoperative course and recovery at length with the patient in the office. We have also reviewed in detail the risks, benefits, and alternatives to the procedure. All questions were answered and Roger Phillips provided informed consent to proceed.  Lisbeth Renshaw, MD [facility]

## 2023-12-25 NOTE — Anesthesia Procedure Notes (Signed)
 Arterial Line Insertion Start/End3/21/2025 7:15 AM, 12/25/2023 7:30 AM Performed by: CRNA  Patient location: Pre-op. Preanesthetic checklist: patient identified, IV checked, site marked, risks and benefits discussed, surgical consent, monitors and equipment checked, pre-op evaluation, timeout performed and anesthesia consent Lidocaine 1% used for infiltration Right, radial was placed Catheter size: 20 G Hand hygiene performed , maximum sterile barriers used  and Seldinger technique used Allen's test indicative of satisfactory collateral circulation Procedure performed using ultrasound guided technique. Ultrasound Notes:anatomy identified, needle tip was noted to be adjacent to the nerve/plexus identified and no ultrasound evidence of intravascular and/or intraneural injection Following insertion, dressing applied. Post procedure assessment: normal  Post procedure complications: unsuccessful attempts and second provider assisted. Patient tolerated the procedure well with no immediate complications.

## 2023-12-25 NOTE — Transfer of Care (Signed)
 Immediate Anesthesia Transfer of Care Note  Patient: Roger Phillips  Procedure(s) Performed: CRANIOTOMY HYPOPHYSECTOMY TRANSNASAL APPROACH SINUS SURGERY, ENDOSCOPIC, USING COMPUTER-ASSISTED NAVIGATION SURGICAL PROCUREMENT, FAT GRAFT, ABDOMEN  Patient Location: PACU  Anesthesia Type:General  Level of Consciousness: awake, alert , oriented, and patient cooperative  Airway & Oxygen Therapy: Patient Spontanous Breathing and Patient connected to face mask oxygen  Post-op Assessment: Report given to RN  Post vital signs: Reviewed and stable  Last Vitals:  Vitals Value Taken Time  BP 155/59 12/25/23 1045  Temp    Pulse 66 12/25/23 1055  Resp 17 12/25/23 1055  SpO2 94 % 12/25/23 1055  Vitals shown include unfiled device data.  Last Pain:  Vitals:   12/25/23 0603  TempSrc:   PainSc: 0-No pain         Complications: No notable events documented.

## 2023-12-25 NOTE — Consult Note (Addendum)
 NAME:  Roger Phillips, MRN:  [id number], DOB:  Aug 20, 1956, LOS: 0 ADMISSION DATE:  12/25/2023, CONSULTATION DATE: 12/25/2023 REFERRING MD:  Lisbeth Renshaw , CHIEF COMPLAINT: Headache and proptosis  History of Present Illness:  68 year old male with diabetes type 2, active tobacco dependence, COPD who was initially seen in the clinic with complaint of diplopia, proptosis and right frontal headache on workup he was noted to have pituitary tumor with cavernous sinus extension, workup was suggestive of low serum cortisol and low free T4 with normal TSH, consistent with mild hypopituitarism.  Today patient underwent endoscopic transnasal transsphenoidal resection of pituitary tumor.  Postoperative patient was transferred to ICU for close monitoring He is complaining of intermittent headache but otherwise stated feeling better  Pertinent  Medical History   Past Medical History:  Diagnosis Date   Arthritis    neck and left shoulder   Asthma    Back pain    buldging disc   Complication of anesthesia    difficult to wake up after neck surgery   COPD (chronic obstructive pulmonary disease) (HCC)    Depression    takes Wellbutrin   Diabetes mellitus    takes Metformin bid   Emphysema    GERD (gastroesophageal reflux disease)    takes Omeprazole daily   Hemorrhoids    High cholesterol    takes Pravastatin daily   HTN (hypertension)    takes Lisinopril,Norvasc,and HCTZ daily   IBS (irritable bowel syndrome)    Insomnia    doesn't take any meds    Pneumonia    hx of in 80's and 90's   Prostate cancer (HCC)    Sinus headache    sinus drainage     Significant Hospital Events: Including procedures, antibiotic start and stop dates in addition to other pertinent events     Interim History / Subjective:  As above  Objective   Blood pressure (!) 128/91, pulse 70, temperature 99.3 F (37.4 C), resp. rate 11, height 6' (1.829 m), weight 91.2 kg, SpO2 93%.        Intake/Output  Summary (Last 24 hours) at 12/25/2023 1812 Last data filed at 12/25/2023 1700 Gross per 24 hour  Intake 1783.17 ml  Output 1250 ml  Net 533.17 ml   Filed Weights   12/25/23 0554  Weight: 91.2 kg    Examination: General: Chronically ill-appearing male, lying on the bed HEENT: [place]/AT, right eye proptosis noted Neuro: Alert, awake following commands.  Antigravity in all 4 extremities Chest: Coarse breath sounds, no wheezes or rhonchi Heart: Regular rate and rhythm, no murmurs or gallops Abdomen: Soft, nontender, nondistended, bowel sounds present Skin: No rash  Labs and images reviewed Resolved Hospital Problem list     Assessment & Plan:  Nonsecreting pituitary tumor status post endoscopic transnasal transsphenoidal resection Mild panhypopituitarism Active tobacco dependence COPD, not in exacerbation Hypertension Diabetes type 2 Hyperlipidemia GERD Hypokalemia, resolved  Continue neuro watch Maintain SBP 130-160 He is on low-dose Cleviprex Resume oral antihypertensive meds including losartan 50 mg daily Continue hydrocortisone and levothyroxine Tobacco cessation counseling provided, patient is not motivated to quit Continue glipizide Continue sliding scale insulin His hemoglobin A1c is 6.9 Continue statin Continue Protonix and sucralfate   Best Practice (right click and "Reselect all SmartList Selections" daily)   Diet/type: Regular consistency DVT prophylaxis: SCD GI prophylaxis: PPI Lines:  Arterial Line, and yes and it is still needed Foley:  Yes, and it is still needed Code Status:  full code Last date of  multidisciplinary goals of care discussion [Per primary team]   Labs   CBC: Recent Labs  Lab 12/23/23 1130 12/25/23 0843 12/25/23 0917 12/25/23 0930  WBC 4.9  --   --  5.1  HGB 14.3 9.2* 8.5* 10.4*  HCT 41.4 27.0* 25.0* 30.2*  MCV 89.8  --   --  90.1  PLT 231  --   --  161    Basic Metabolic Panel: Recent Labs  Lab 12/23/23 1130  12/25/23 0843 12/25/23 0917 12/25/23 0930 12/25/23 1455  NA 135 141 142 139 136  K 3.9 2.5* 2.5* 2.4* 4.1  CL 100  --   --  114* 103  CO2 30  --   --  18* 21*  GLUCOSE 107*  --   --  94 185*  BUN 15  --   --  13 14  CREATININE 1.52*  --   --  0.99 1.28*  CALCIUM 9.5  --   --  6.5* 9.0   GFR: Estimated Creatinine Clearance: 59.8 mL/min (A) (by C-G formula based on SCr of 1.28 mg/dL (H)). Recent Labs  Lab 12/23/23 1130 12/25/23 0930  WBC 4.9 5.1    Liver Function Tests: No results for input(s): "AST", "ALT", "ALKPHOS", "BILITOT", "PROT", "ALBUMIN" in the last 168 hours. No results for input(s): "LIPASE", "AMYLASE" in the last 168 hours. No results for input(s): "AMMONIA" in the last 168 hours.  ABG    Component Value Date/Time   PHART 7.348 (L) 12/25/2023 0917   PCO2ART 29.1 (L) 12/25/2023 0917   PO2ART 234 (H) 12/25/2023 0917   HCO3 16.4 (L) 12/25/2023 0917   TCO2 17 (L) 12/25/2023 0917   ACIDBASEDEF 9.0 (H) 12/25/2023 0917   O2SAT 100 12/25/2023 0917     Coagulation Profile: No results for input(s): "INR", "PROTIME" in the last 168 hours.  Cardiac Enzymes: No results for input(s): "CKTOTAL", "CKMB", "CKMBINDEX", "TROPONINI" in the last 168 hours.  HbA1C: Hgb A1c MFr Bld  Date/Time Value Ref Range Status  12/23/2023 11:30 AM 6.9 (H) 4.8 - 5.6 % Final    Comment:    (NOTE) Pre diabetes:          5.7%-6.4%  Diabetes:              >6.4%  Glycemic control for   <7.0% adults with diabetes   09/08/2022 01:56 PM 8.6 (H) 4.8 - 5.6 % Final    Comment:             Prediabetes: 5.7 - 6.4          Diabetes: >6.4          Glycemic control for adults with diabetes: <7.0     CBG: Recent Labs  Lab 12/23/23 1116 12/25/23 0554 12/25/23 1044 12/25/23 1410 12/25/23 1711  GLUCAP 113* 107* 127* 180* 203*    Review of Systems:   Review of system is significant for complaint mentioned in HPI, rest negative  Past Medical History:  He,  has a past medical  history of Arthritis, Asthma, Back pain, Complication of anesthesia, COPD (chronic obstructive pulmonary disease) (HCC), Depression, Diabetes mellitus, Emphysema, GERD (gastroesophageal reflux disease), Hemorrhoids, High cholesterol, HTN (hypertension), IBS (irritable bowel syndrome), Insomnia, Pneumonia, Prostate cancer (HCC), and Sinus headache.   Surgical History:   Past Surgical History:  Procedure Laterality Date   ANTERIOR CERVICAL DECOMP/DISCECTOMY FUSION  12/04/2011   Procedure: ANTERIOR CERVICAL DECOMPRESSION/DISCECTOMY FUSION 2 LEVELS;  Surgeon: Hewitt Shorts, MD;  Location: [facility];  Service:  Neurosurgery;  Laterality: N/A;  Cervical Three-Four, Cervical Four-Five anterior cervial decompression with fusion plating and bonegraft   CARPAL TUNNEL RELEASE  10+yrs   right side   COLONOSCOPY N/A 04/19/2014   Procedure: COLONOSCOPY;  Surgeon: Malissa Hippo, MD;  [facility];  Service: Endoscopy;  Laterality: N/A;  1030-moved to 1230 Ann to notify pt   CYSTECTOMY  12-81yrs ago   from left side of neck   CYSTOSCOPY WITH URETHRAL DILATATION N/A 09/22/2022   Procedure: CYSTOSCOPY WITH URETHRAL DILATATION-optilume;  Surgeon: Malen Gauze, MD;  Location: [facility];  Service: Urology;  Laterality: N/A;   GOLD SEED IMPLANT N/A 12/06/2020   Procedure: GOLD SEED IMPLANT;  Surgeon: Malen Gauze, MD;  Location: [facility];  Service: Urology;  Laterality: N/A;   SPACE OAR INSTILLATION N/A 12/06/2020   Procedure: SPACE OAR INSTILLATION;  Surgeon: Malen Gauze, MD;  Location: [facility];  Service: Urology;  Laterality: N/A;     Social History:   reports that he has been smoking cigarettes. He has a 60 pack-year smoking history. He has never used smokeless tobacco. He reports current alcohol use of about 6.0 standard drinks of alcohol per week. He reports that he does not use drugs.   Family History:  His family history includes Heart disease in his brother and father;  Hypertension in his father. There is no history of Anesthesia problems, Hypotension, Malignant hyperthermia, Pseudochol deficiency, Breast cancer, Prostate cancer, Colon cancer, or Pancreatic cancer.   Allergies Allergies  Allergen Reactions   Doxycycline Diarrhea   Morphine And Codeine Other (See Comments)    Insomnia    Sulfa Antibiotics Rash     Home Medications  Prior to Admission medications   Medication Sig Start Date End Date Taking? Authorizing Provider  albuterol (VENTOLIN HFA) 108 (90 Base) MCG/ACT inhaler 2 puff q 4 prn Patient taking differently: Inhale 2 puffs into the lungs every 4 (four) hours as needed for wheezing or shortness of breath. 2 puff q 4 prn 01/14/23  Yes Luking, Scott A, MD  aspirin EC 81 MG tablet Take 81 mg by mouth daily. Swallow whole.   Yes [provider]  diphenhydrAMINE (BENADRYL) 25 MG tablet Take 25 mg by mouth daily.   Yes [provider]  diphenoxylate-atropine (LOMOTIL) 2.5-0.025 MG tablet TAKE ONE TABLET BY MOUTH TWICE A DAY AS NEEDED FOR DIARRHEA DUE TO PROSTATE RADIATION. 08/27/23  Yes Luking, Jonna Coup, MD  glipiZIDE (GLUCOTROL) 10 MG tablet TAKE ONE  TABLET BY MOUTH TWICE A DAY 08/05/23  Yes Luking, Scott A, MD  losartan (COZAAR) 50 MG tablet TAKE 1 TABLET BY MOUTH DAILY. 10/26/23  Yes Luking, Jonna Coup, MD  omeprazole (PRILOSEC) 20 MG capsule TAKE 1 TABLET BY MOUTH TWICE DAILY FOR GERD. 10/02/23  Yes Babs Sciara, MD  pravastatin (PRAVACHOL) 40 MG tablet Take 1 tablet (40 mg total) by mouth daily. 01/14/23  Yes Luking, Scott A, MD  sucralfate (CARAFATE) 1 g tablet TAKE ONE (1) TABLET BY MOUTH 3 TIMES DAILY FOR GERD. Patient taking differently: Take 1 g by mouth 2 (two) times daily. 01/27/22  Yes Luking, Jonna Coup, MD  WIXELA INHUB 250-50 MCG/ACT AEPB INHALE ONE PUFF BY MOUTH TWICE DAILY. 11/03/23  Yes Babs Sciara, MD  ACCU-CHEK SOFTCLIX LANCETS lancets  11/15/13   [provider]  Alcohol Swabs (B-D SINGLE USE SWABS  REGULAR) PADS  11/15/13   [provider]  Blood Glucose Calibration (ACCU-CHEK AVIVA) SOLN  11/15/13  [provider]  Blood Glucose Monitoring Suppl (ACCU-CHEK AVIVA PLUS) W/DEVICE KIT  11/15/13   [provider]  FREESTYLE LITE test strip USE TO TEST BLOOD GLUCOSE THREE TIMES DAILY 11/07/20   Babs Sciara, MD  Lancets (FREESTYLE) lancets Use to check blood sugar TID 11/08/20   Babs Sciara, MD       Cheri Fowler, MD [facility] See Amion for pager If no response to pager, please call [phone] until 7pm After 7pm, Please call E-link [phone]

## 2023-12-25 NOTE — Op Note (Signed)
  NEUROSURGERY OPERATIVE NOTE   PREOP DIAGNOSIS:  Pituitary tumor   POSTOP DIAGNOSIS: Same  PROCEDURE: Endoscopic transnasal transsphenoidal resection of pituitary tumor Use of intraoperative stereotaxy  SURGEON: Dr. Lisbeth Renshaw, MD  CO-SURGEON: Dr. Christia Reading, MD  ANESTHESIA: General Endotracheal  EBL: Minimal  SPECIMENS: Pituitary tumor for permanent pathology  DRAINS: None  COMPLICATIONS: None immediate  CONDITION: Hemodynamically stable to PACU  HISTORY: Roger Phillips is a 68 y.o. male presenting to the outpatient neurosurgery clinic with rather acute onset of a few weeks of right sided proptosis, diplopia related to ophthalmoplegia, and some headache.  His MRI did reveal a pituitary tumor with cavernous sinus extension and suprasellar extension.  Interestingly, there was no diffusion restriction or hemorrhage within the tumor to suggest apoplexy.  Nonetheless, given his clinical and radiographic findings I did recommend urgent operative resection.  His preoperative endocrine profile did not suggest a secretory tumor.  The risks, benefits, and alternatives to surgery were all reviewed in detail with the patient.  After all his questions were answered informed consent was obtained and witnessed.  PROCEDURE IN DETAIL: The patient was brought to the operating room. After induction of general anesthesia, the patient was positioned on the operative table in the supine position. All pressure points were meticulously padded.  Preoperative stereotactic CT scan was then Co. registered with surface markers and an excellent accuracy was achieved.  Skin of the mid face and both nares were prepped and draped in the usual sterile fashion.  After timeout was conducted, Dr. Jenne Pane affected and exposure of the sphenoid sinus and the anterior aspect of the sella, details of which are dictated separately.  Once access to the sella was obtained, the face of the sella was removed using a  combination of high-speed drill and Kerrison punches.  The dura was then identified and coagulated.  The anterior aspect of the dura was then incised in a cruciate fashion.  Dissectors were used to elevate the dural leaflets.  The underlying tumor was noted to be somewhat firm and yellowish in color.  It was however easily suckable.  A combination of ring curettes, I was easily able to remove the inferior aspect of the tumor extending back to the dorsum sella.  Using angled curettes I then began working on the tumor within the right side of the sella.  I was able to visualize the medial wall of the cavernous sinus on that side.  In a similar fashion, I removed tumor on the left side again able to visualize the cavernous sinus.  Ultimately, after removal of the intrasellar component of the tumor, the diaphragm sella dropped down into view.  I was able to visually inspect the sellar cavity, and no further tumor was visualized.  Hemostasis was easily achieved with morselized Gelfoam and thrombin.  This was irrigated and the face of the cell was covered with Gelfoam and a layer of polyethylene glycol sealant.  Manger of the approach to the sella was then closed by Dr. Jenne Pane, details again dictated separately.  The patient was then extubated, transferred to the stretcher, and taken to the postanesthesia care unit in stable hemodynamic condition. At the end of the case all sponge, needle, and instrument counts were correct.    Lisbeth Renshaw, MD [facility]

## 2023-12-25 NOTE — Anesthesia Procedure Notes (Signed)
 Procedure Name: Intubation Date/Time: 12/25/2023 8:12 AM  Performed by: Orlin Hilding, CRNAPre-anesthesia Checklist: Patient identified, Patient being monitored, Timeout performed, Emergency Drugs available and Suction available Patient Re-evaluated:Patient Re-evaluated prior to induction Oxygen Delivery Method: Circle system utilized Preoxygenation: Pre-oxygenation with 100% oxygen Induction Type: IV induction Ventilation: Oral airway inserted - appropriate to patient size and Two handed mask ventilation required Laryngoscope Size: Glidescope and 4 Grade View: Grade I Tube type: Oral Tube size: 7.5 mm Number of attempts: 1 Airway Equipment and Method: Stylet and Video-laryngoscopy Placement Confirmation: ETT inserted through vocal cords under direct vision, positive ETCO2 and breath sounds checked- equal and bilateral Secured at: 21 cm Tube secured with: Tape Dental Injury: Teeth and Oropharynx as per pre-operative assessment  Comments: Beard hindered mask ventilation

## 2023-12-25 NOTE — Op Note (Signed)
 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  He presents to the operating room for surgical management under the care of Dr. Conchita Paris.   FINDINGS:  Nasal septum mildly deviated to right anteriorly, not obstructive.  Otherwise, sinonasal anatomy unremarkable.   DESCRIPTION OF PROCEDURE:  The patient was identified in the holding room, informed consent having been obtained including discussion of risks, benefits and alternatives, the patient was brought to the operative suite and put the operative table in the supine position.  Anesthesia was induced and the patient was intubated by the anesthesia team without difficulty.  The eyes were lubricated and the Fusion antenna was placed.  The patient was given intravenous antibiotics during the case.  The bed was turned 90 degrees from Anesthesia.  The Fusion image guidance system was registered in the standard fashion.  The face was prepped and draped in sterile fashion as was the abdominal wall.  The eyes were taped closed and Afrin-soaked pledgets were placed in both sides of the nose.  After registering instruments, the right-sided pledgets were removed and the nasal passage was inspected with a straight telescope.  The lateral nasal wall was injected with local anesthetic.  The inferior and middle turbinates were lateralized and the posterior septum injected with local anesthetic.  The same was then performed on the left side.  Each superior turbinate was then lateralized exposing the natural sphenoid ostia which were then opened  further using Kerrisons without extending inferiorly.  A pledget was placed back in the left nasal passage.  The posterior septectomy was then performed by penetrating the septum even with the sphenoid ostia posterior to the head of the middle turbinate from the right side.  The posterior septum was then partially removed with cutting forceps back to the sphenoid rostrum superior to the sphenoid ostia.  Inferior septal mucosa was then elevated down the posterior septum allowing exposure of the sphenoid keel.  This was then removed using forceps in a piecemeal fashion.  The sphenoid septum was then removed exposing the face of the sella turcica.  The mucosa was removed from the face and a drill was used to thin the bone covering the sella.  At this point, Dr. Conchita Paris performed his portion of the case with my assistance holding the endoscope.  Please see his operative note.  After completion of his portion including placing Floseal, Gelfoam and Green Glue over the face of the sella, pieces of PosiSep packs were coated with mupirocin ointment and placed in the sphenoid sinus and saturated with saline.  The nose and throat were then suctioned and his face was cleaned off.  The patient was turned back to Anesthesia for wake-up, was extubated, and moved to the recovery room in stable condition.

## 2023-12-26 DIAGNOSIS — N179 Acute kidney failure, unspecified: Secondary | ICD-10-CM | POA: Diagnosis not present

## 2023-12-26 DIAGNOSIS — E8721 Acute metabolic acidosis: Secondary | ICD-10-CM | POA: Diagnosis not present

## 2023-12-26 DIAGNOSIS — D352 Benign neoplasm of pituitary gland: Secondary | ICD-10-CM | POA: Diagnosis not present

## 2023-12-26 DIAGNOSIS — J449 Chronic obstructive pulmonary disease, unspecified: Secondary | ICD-10-CM | POA: Diagnosis not present

## 2023-12-26 LAB — BASIC METABOLIC PANEL
Anion gap: 12 (ref 5–15)
BUN: 19 mg/dL (ref 8–23)
CO2: 16 mmol/L — ABNORMAL LOW (ref 22–32)
Calcium: 8.6 mg/dL — ABNORMAL LOW (ref 8.9–10.3)
Chloride: 106 mmol/L (ref 98–111)
Creatinine, Ser: 1.38 mg/dL — ABNORMAL HIGH (ref 0.61–1.24)
GFR, Estimated: 55 mL/min — ABNORMAL LOW (ref >60)
Glucose, Bld: 163 mg/dL — ABNORMAL HIGH (ref 70–99)
Potassium: 4.7 mmol/L (ref 3.5–5.1)
Sodium: 134 mmol/L — ABNORMAL LOW (ref 135–145)

## 2023-12-26 LAB — GLUCOSE, CAPILLARY
Glucose-Capillary: 118 mg/dL — ABNORMAL HIGH (ref 70–99)
Glucose-Capillary: 137 mg/dL — ABNORMAL HIGH (ref 70–99)

## 2023-12-26 MED ORDER — LACTATED RINGERS IV BOLUS: 500.0000 mL | Freq: Once | INTRAVENOUS | Status: DC

## 2023-12-26 MED ORDER — LIDOCAINE 5 % EX PTCH
1.0000 | MEDICATED_PATCH | Freq: Every day | CUTANEOUS | Status: DC
  Administered 2023-12-26: 1 via TRANSDERMAL
  Filled 2023-12-26 (×2): qty 1

## 2023-12-26 MED ORDER — HYDROCORTISONE 10 MG PO TABS: 10.0000 mg | ORAL_TABLET | Freq: Every evening | ORAL | 0 refills | Status: DC

## 2023-12-26 MED ORDER — ORAL CARE MOUTH RINSE: 15.0000 mL | OROMUCOSAL | Status: DC | PRN

## 2023-12-26 MED ORDER — HYDRALAZINE HCL 20 MG/ML IJ SOLN
10.0000 mg | INTRAMUSCULAR | Status: DC | PRN
  Administered 2023-12-26: 10 mg via INTRAVENOUS
  Filled 2023-12-26: qty 1

## 2023-12-26 MED ORDER — HYDROCORTISONE 20 MG PO TABS: 20.0000 mg | ORAL_TABLET | Freq: Every morning | ORAL | 0 refills | Status: AC

## 2023-12-26 NOTE — Evaluation (Addendum)
 Occupational Therapy Evaluation Patient Details Name: Roger Phillips MRN: [id number] DOB: 12/09/1955 Today's Date: 12/26/2023   History of Present Illness   68 yo male s/p endoscopic transnasal transsphenoidal resection of pituitary tumor on 3/21, symptoms of diplopia and proptosis. PMH includes OA, asthma, COPD with tobacco dependence, depression, DM, GERD, HTN, IBS, prostate cancer, ACDF 2013.     Clinical Impressions PTA, pt was independent with ADL/IADL and functional mobility. Pt reports he feels he is currently close to baseline other than diplopia. He demonstrates preference for keeping R eye closed to compensate. Provided pt with partial occlusion glasses, which pt reports improves diplopia. Educated pt on partial occlusion progression and management with provided handout and HEP. Pt demonstrated ability to navigate safely with use of scanning technique and wearing partial occlusion glasses. Pt would benefit from follow-up vision therapy. Pt is appropriate to d/c home when medically stable and follow-up with outpatient therapy to address vision. However, should pt remain in acute care will continue to follow to address vision limitations and management of partial occlusion glasses.      If plan is discharge home, recommend the following:   Assist for transportation     Functional Status Assessment   Patient has had a recent decline in their functional status and demonstrates the ability to make significant improvements in function in a reasonable and predictable amount of time.     Equipment Recommendations   None recommended by OT     Recommendations for Other Services         Precautions/Restrictions   Precautions Precautions: Fall Restrictions Weight Bearing Restrictions Per Provider Order: No     Mobility Bed Mobility Overal bed mobility: Needs Assistance             General bed mobility comments: OOB    Transfers Overall transfer level:  Modified independent Equipment used: None               General transfer comment: slow to rise, no physical assist      Balance Overall balance assessment: Needs assistance   Sitting balance-Leahy Scale: Good     Standing balance support: No upper extremity supported, During functional activity Standing balance-Leahy Scale: Good Standing balance comment: tolerates vertical and horizontal head turns, fast/slow gait, directional changes, and stairs                           ADL either performed or assessed with clinical judgement   ADL Overall ADL's : Modified independent                                       General ADL Comments: increased time and use of visual aid(partial occlusion glasses) and scanning technique to maximize safety with mobility and reduce occurence of diplopia during ADL.     Vision Baseline Vision/History: 1 Wears glasses (bifocals for reading) Ability to See in Adequate Light: 1 Impaired Patient Visual Report: Diplopia;Eye fatigue/eye pain/headache Vision Assessment?: Yes Alignment/Gaze Preference: Head turned Tracking/Visual Pursuits: Decreased smoothness of horizontal tracking;Decreased smoothness of vertical tracking;Requires cues, head turns, or add eye shifts to track;Unable to hold eye position out of midline;Right eye does not track laterally Saccades: Undershoots;Decreased speed of saccadic movement;Additional head turns occurred during testing Diplopia Assessment: Disappears with one eye closed;Objects split side to side;Present in far gaze;Present in primary gaze Additional Comments: +ptosis and proptosis  R eye. pt with Left eye dominant, pt keeping R eye closed at beginning of session to compensate for diplopia; provided pt with a small amount of opaque tape to right nasal portion of glasses. pt reported improvement in diplopia with partial occlusion. educated pt with provided handout to adjust partial occlusion every  2-3 days as appropriate. he demonstrated improved depth perception during ambulation. continued to educate pt on importance of scanning during mobility to maximize safety     Perception         Praxis Praxis: [facility]       Pertinent Vitals/Pain Pain Assessment Pain Assessment: No/denies pain Faces Pain Scale: Hurts a little bit Pain Location: head Pain Descriptors / Indicators: Headache Pain Intervention(s): Limited activity within patient's tolerance, Monitored during session     Extremity/Trunk Assessment Upper Extremity Assessment Upper Extremity Assessment: Overall WFL for tasks assessed   Lower Extremity Assessment Lower Extremity Assessment: Overall WFL for tasks assessed   Cervical / Trunk Assessment Cervical / Trunk Assessment: Normal   Communication     Cognition Arousal: Alert Behavior During Therapy: WFL for tasks assessed/performed Cognition: No apparent impairments                               Following commands: Intact       Cueing  General Comments   Cueing Techniques: Verbal cues  vss on RA   Exercises Exercises: Other exercises Other Exercises Other Exercises: reviewed vision exercises to address tracking, pursuits, and depth perception with provided handout   Shoulder Instructions      Home Living Family/patient expects to be discharged to:: Private residence Living Arrangements: Spouse/significant other Available Help at Discharge: Family Type of Home: House Home Access: Stairs to enter Secretary/administrator of Steps: 1   Home Layout: One level (3 steps to get from den to rest of house)     Bathroom Shower/Tub: Tub/shower unit;Walk-in shower   Bathroom Toilet: Standard     Home Equipment: Agricultural consultant (2 wheels);Cane - single point          Prior Functioning/Environment Prior Level of Function : Independent/Modified Independent             Mobility Comments: pt reports no use of AD, independent, no  falls      OT Problem List: Impaired vision/perception   OT Treatment/Interventions:        OT Goals(Current goals can be found in the care plan section)   Acute Rehab OT Goals Patient Stated Goal: to go home today OT Goal Formulation: With patient Time For Goal Achievement: 01/09/24 Potential to Achieve Goals: Good   OT Frequency:       Co-evaluation              AM-PAC OT "6 Clicks" Daily Activity     Outcome Measure Help from another person eating meals?: None Help from another person taking care of personal grooming?: None Help from another person toileting, which includes using toliet, bedpan, or urinal?: None Help from another person bathing (including washing, rinsing, drying)?: None Help from another person to put on and taking off regular upper body clothing?: None Help from another person to put on and taking off regular lower body clothing?: None 6 Click Score: 24   End of Session Equipment Utilized During Treatment: Rolling walker (2 wheels) Nurse Communication: Mobility status  Activity Tolerance: Patient tolerated treatment well Patient left: in bed;with call bell/phone within reach;with  family/visitor present;Other (comment) (with MD present)  OT Visit Diagnosis: Low vision, both eyes (H54.2)                Time: 6045-4098 OT Time Calculation (min): 21 min Charges:  OT General Charges $OT Visit: 1 Visit OT Evaluation $OT Eval Moderate Complexity: 1 Mod  Tattianna Schnarr OTR/L Acute Rehabilitation Services Office: [phone]   Roger Phillips 12/26/2023, 2:53 PM

## 2023-12-26 NOTE — Discharge Summary (Signed)
 Physician Discharge Summary  Patient ID: Roger Phillips MRN: [id number] DOB/AGE: 70/14/1957 70 y.o.  Admit date: 12/25/2023 Discharge date: 12/26/2023  Admission Diagnoses:pituitary adenoma  Discharge Diagnoses:  Principal Problem:   Pituitary macroadenoma [facility])   Discharged Condition: good  Hospital Course: Roger Phillips was admitted and taken to the operating room for a transsphenoidal pituitary tumor resection. Post op he has done well. Right eye remains ptotic, minimally reactive pupil, and very little lateral movement. Remains with diplopia.   Treatments: surgery: as above  Discharge Exam: Blood pressure 117/61, pulse 69, temperature 97.7 F (36.5 C), temperature source Oral, resp. rate (!) 53, height 6' (1.829 m), weight 91.2 kg, SpO2 96%. General appearance: alert, cooperative, appears stated age, and no distress  Disposition: Discharge disposition: 01-Home or Self Care      Pituitary Macroadenoma  Allergies as of 12/26/2023       Reactions   Doxycycline Diarrhea   Morphine And Codeine Other (See Comments)   Insomnia   Sulfa Antibiotics Rash        Medication List     TAKE these medications    Accu-Chek Aviva Plus w/Device Kit   Accu-Chek Aviva Soln   Accu-Chek Softclix Lancets lancets   freestyle lancets Use to check blood sugar TID   albuterol 108 (90 Base) MCG/ACT inhaler Commonly known as: VENTOLIN HFA 2 puff q 4 prn What changed:  how much to take how to take this when to take this reasons to take this   aspirin EC 81 MG tablet Take 81 mg by mouth daily. Swallow whole.   B-D SINGLE USE SWABS REGULAR Pads   diphenhydrAMINE 25 MG tablet Commonly known as: BENADRYL Take 25 mg by mouth daily.   diphenoxylate-atropine 2.5-0.025 MG tablet Commonly known as: LOMOTIL TAKE ONE TABLET BY MOUTH TWICE A DAY AS NEEDED FOR DIARRHEA DUE TO PROSTATE RADIATION.   FREESTYLE LITE test strip Generic drug: glucose blood USE TO TEST BLOOD GLUCOSE  THREE TIMES DAILY   glipiZIDE 10 MG tablet Commonly known as: GLUCOTROL TAKE ONE  TABLET BY MOUTH TWICE A DAY   hydrocortisone 10 MG tablet Commonly known as: CORTEF Take 1 tablet (10 mg total) by mouth every evening.   hydrocortisone 20 MG tablet Commonly known as: CORTEF Take 1 tablet (20 mg total) by mouth every morning. Start taking on: December 27, 2023   losartan 50 MG tablet Commonly known as: COZAAR TAKE 1 TABLET BY MOUTH DAILY.   omeprazole 20 MG capsule Commonly known as: PRILOSEC TAKE 1 TABLET BY MOUTH TWICE DAILY FOR GERD.   pravastatin 40 MG tablet Commonly known as: PRAVACHOL Take 1 tablet (40 mg total) by mouth daily.   sucralfate 1 g tablet Commonly known as: CARAFATE TAKE ONE (1) TABLET BY MOUTH 3 TIMES DAILY FOR GERD. What changed: See the new instructions.   Wixela Inhub 250-50 MCG/ACT Aepb Generic drug: fluticasone-salmeterol INHALE ONE PUFF BY MOUTH TWICE DAILY.        Follow-up Information     Christia Reading, MD. Schedule an appointment as soon as possible for a visit in 2 week(s).   Specialty: Otolaryngology Contact information: [address] [place] [address] [phone]         Lisbeth Renshaw, MD Follow up.   Specialty: Neurosurgery Why: keep your scheduled appointment, if you do not have one then call to make an appointment Contact information: [address] [place] [address] [phone]  SignedColetta Memos 12/26/2023, 3:02 PM

## 2023-12-26 NOTE — Consult Note (Signed)
 NAME:  Roger Phillips, MRN:  [id number], DOB:  05/24/1956, LOS: 1 ADMISSION DATE:  12/25/2023, CONSULTATION DATE: 12/25/2023 REFERRING MD:  Lisbeth Renshaw , CHIEF COMPLAINT: Headache and proptosis  History of Present Illness:  68 year old male with diabetes type 2, active tobacco dependence, COPD who was initially seen in the clinic with complaint of diplopia, proptosis and right frontal headache on workup he was noted to have pituitary tumor with cavernous sinus extension, workup was suggestive of low serum cortisol and low free T4 with normal TSH, consistent with mild hypopituitarism.  Today patient underwent endoscopic transnasal transsphenoidal resection of pituitary tumor.  Postoperative patient was transferred to ICU for close monitoring He is complaining of intermittent headache but otherwise stated feeling better  Pertinent  Medical History   Past Medical History:  Diagnosis Date   Arthritis    neck and left shoulder   Asthma    Back pain    buldging disc   Complication of anesthesia    difficult to wake up after neck surgery   COPD (chronic obstructive pulmonary disease) (HCC)    Depression    takes Wellbutrin   Diabetes mellitus    takes Metformin bid   Emphysema    GERD (gastroesophageal reflux disease)    takes Omeprazole daily   Hemorrhoids    High cholesterol    takes Pravastatin daily   HTN (hypertension)    takes Lisinopril,Norvasc,and HCTZ daily   IBS (irritable bowel syndrome)    Insomnia    doesn't take any meds    Pneumonia    hx of in 80's and 90's   Prostate cancer (HCC)    Sinus headache    sinus drainage     Significant Hospital Events: Including procedures, antibiotic start and stop dates in addition to other pertinent events   3/21 s/p endoscopic transnasal transsphenoidal resection of pituitary tumor   Interim History / Subjective:   No acute complaints Coughing up blood tinged sputum, has some post nasal drip  Objective   Blood  pressure 106/63, pulse 60, temperature 98 F (36.7 C), temperature source Oral, resp. rate 12, height 6' (1.829 m), weight 91.2 kg, SpO2 93%.        Intake/Output Summary (Last 24 hours) at 12/26/2023 1023 Last data filed at 12/26/2023 0700 Gross per 24 hour  Intake 558.37 ml  Output 1600 ml  Net -1041.63 ml   Filed Weights   12/25/23 0554  Weight: 91.2 kg    Examination: General: elderly male, sitting up in chair, no distress HEENT: [place]/AT, right eye closed Neuro: Alert, awake following commands.  Antigravity in all 4 extremities Chest: clear to auscultation, no wheezes or rhonchi Heart: Regular rate and rhythm, no murmurs or gallops Abdomen: Soft, nontender, nondistended, bowel sounds present Skin: No rash  Labs and images reviewed Resolved Hospital Problem list     Assessment & Plan:  Nonsecreting pituitary tumor status post endoscopic transnasal transsphenoidal resection Mild panhypopituitarism Active tobacco dependence Metabolic Acidosis AKI COPD, not in exacerbation Hypertension Diabetes type 2 Hyperlipidemia GERD Hypokalemia, resolved  Continue neuro watch Maintain SBP 130-160 Continue oral antihypertensive meds including losartan 50 mg daily Continue hydrocortisone and levothyroxine Tobacco cessation counseling provided, patient is not motivated to quit Continue glipizide Continue sliding scale insulin His hemoglobin A1c is 6.9 Continue statin Continue Protonix and sucralfate Give of IV fluids for AKI, has resumed oral diet  Best Practice (right click and "Reselect all SmartList Selections" daily)   Diet/type: Regular consistency DVT prophylaxis:  SCD GI prophylaxis: PPI Lines:  Arterial Line, and yes and it is still needed Foley:  Yes, and it is still needed Code Status:  full code Last date of multidisciplinary goals of care discussion [Per primary team]   Labs   CBC: Recent Labs  Lab 12/23/23 1130 12/25/23 0843 12/25/23 0917  12/25/23 0930  WBC 4.9  --   --  5.1  HGB 14.3 9.2* 8.5* 10.4*  HCT 41.4 27.0* 25.0* 30.2*  MCV 89.8  --   --  90.1  PLT 231  --   --  161    Basic Metabolic Panel: Recent Labs  Lab 12/23/23 1130 12/25/23 0843 12/25/23 0917 12/25/23 0930 12/25/23 1455 12/26/23 0340  NA 135 141 142 139 136 134*  K 3.9 2.5* 2.5* 2.4* 4.1 4.7  CL 100  --   --  114* 103 106  CO2 30  --   --  18* 21* 16*  GLUCOSE 107*  --   --  94 185* 163*  BUN 15  --   --  13 14 19   CREATININE 1.52*  --   --  0.99 1.28* 1.38*  CALCIUM 9.5  --   --  6.5* 9.0 8.6*   GFR: Estimated Creatinine Clearance: 55.5 mL/min (A) (by C-G formula based on SCr of 1.38 mg/dL (H)). Recent Labs  Lab 12/23/23 1130 12/25/23 0930  WBC 4.9 5.1    Liver Function Tests: No results for input(s): "AST", "ALT", "ALKPHOS", "BILITOT", "PROT", "ALBUMIN" in the last 168 hours. No results for input(s): "LIPASE", "AMYLASE" in the last 168 hours. No results for input(s): "AMMONIA" in the last 168 hours.  ABG    Component Value Date/Time   PHART 7.348 (L) 12/25/2023 0917   PCO2ART 29.1 (L) 12/25/2023 0917   PO2ART 234 (H) 12/25/2023 0917   HCO3 16.4 (L) 12/25/2023 0917   TCO2 17 (L) 12/25/2023 0917   ACIDBASEDEF 9.0 (H) 12/25/2023 0917   O2SAT 100 12/25/2023 0917     Coagulation Profile: No results for input(s): "INR", "PROTIME" in the last 168 hours.  Cardiac Enzymes: No results for input(s): "CKTOTAL", "CKMB", "CKMBINDEX", "TROPONINI" in the last 168 hours.  HbA1C: Hgb A1c MFr Bld  Date/Time Value Ref Range Status  12/23/2023 11:30 AM 6.9 (H) 4.8 - 5.6 % Final    Comment:    (NOTE) Pre diabetes:          5.7%-6.4%  Diabetes:              >6.4%  Glycemic control for   <7.0% adults with diabetes   09/08/2022 01:56 PM 8.6 (H) 4.8 - 5.6 % Final    Comment:             Prediabetes: 5.7 - 6.4          Diabetes: >6.4          Glycemic control for adults with diabetes: <7.0     CBG: Recent Labs  Lab  12/25/23 1044 12/25/23 1410 12/25/23 1711 12/25/23 2115 12/26/23 0718  GLUCAP 127* 180* 203* 204* 118*    Review of Systems:   Review of system is significant for complaint mentioned in HPI, rest negative  Past Medical History:  He,  has a past medical history of Arthritis, Asthma, Back pain, Complication of anesthesia, COPD (chronic obstructive pulmonary disease) (HCC), Depression, Diabetes mellitus, Emphysema, GERD (gastroesophageal reflux disease), Hemorrhoids, High cholesterol, HTN (hypertension), IBS (irritable bowel syndrome), Insomnia, Pneumonia, Prostate cancer (HCC), and Sinus headache.  Surgical History:   Past Surgical History:  Procedure Laterality Date   ANTERIOR CERVICAL DECOMP/DISCECTOMY FUSION  12/04/2011   Procedure: ANTERIOR CERVICAL DECOMPRESSION/DISCECTOMY FUSION 2 LEVELS;  Surgeon: Hewitt Shorts, MD;  Location: [facility];  Service: Neurosurgery;  Laterality: N/A;  Cervical Three-Four, Cervical Four-Five anterior cervial decompression with fusion plating and bonegraft   CARPAL TUNNEL RELEASE  10+yrs   right side   COLONOSCOPY N/A 04/19/2014   Procedure: COLONOSCOPY;  Surgeon: Malissa Hippo, MD;  Location: [facility];  Service: Endoscopy;  Laterality: N/A;  1030-moved to 1230 Ann to notify pt   CYSTECTOMY  12-66yrs ago   from left side of neck   CYSTOSCOPY WITH URETHRAL DILATATION N/A 09/22/2022   Procedure: CYSTOSCOPY WITH URETHRAL DILATATION-optilume;  Surgeon: Malen Gauze, MD;  Location: [facility];  Service: Urology;  Laterality: N/A;   GOLD SEED IMPLANT N/A 12/06/2020   Procedure: GOLD SEED IMPLANT;  Surgeon: Malen Gauze, MD;  Location: [facility];  Service: Urology;  Laterality: N/A;   SPACE OAR INSTILLATION N/A 12/06/2020   Procedure: SPACE OAR INSTILLATION;  Surgeon: Malen Gauze, MD;  Location: [facility];  Service: Urology;  Laterality: N/A;     Social History:   reports that he has been smoking cigarettes. He has a 60 pack-year  smoking history. He has never used smokeless tobacco. He reports current alcohol use of about 6.0 standard drinks of alcohol per week. He reports that he does not use drugs.   Family History:  His family history includes Heart disease in his brother and father; Hypertension in his father. There is no history of Anesthesia problems, Hypotension, Malignant hyperthermia, Pseudochol deficiency, Breast cancer, Prostate cancer, Colon cancer, or Pancreatic cancer.   Allergies Allergies  Allergen Reactions   Doxycycline Diarrhea   Morphine And Codeine Other (See Comments)    Insomnia    Sulfa Antibiotics Rash     Home Medications  Prior to Admission medications   Medication Sig Start Date End Date Taking? Authorizing Provider  albuterol (VENTOLIN HFA) 108 (90 Base) MCG/ACT inhaler 2 puff q 4 prn Patient taking differently: Inhale 2 puffs into the lungs every 4 (four) hours as needed for wheezing or shortness of breath. 2 puff q 4 prn 01/14/23  Yes Luking, Scott A, MD  aspirin EC 81 MG tablet Take 81 mg by mouth daily. Swallow whole.   Yes [provider]  diphenhydrAMINE (BENADRYL) 25 MG tablet Take 25 mg by mouth daily.   Yes [provider]  diphenoxylate-atropine (LOMOTIL) 2.5-0.025 MG tablet TAKE ONE TABLET BY MOUTH TWICE A DAY AS NEEDED FOR DIARRHEA DUE TO PROSTATE RADIATION. 08/27/23  Yes Luking, Jonna Coup, MD  glipiZIDE (GLUCOTROL) 10 MG tablet TAKE ONE  TABLET BY MOUTH TWICE A DAY 08/05/23  Yes Luking, Scott A, MD  losartan (COZAAR) 50 MG tablet TAKE 1 TABLET BY MOUTH DAILY. 10/26/23  Yes Luking, Jonna Coup, MD  omeprazole (PRILOSEC) 20 MG capsule TAKE 1 TABLET BY MOUTH TWICE DAILY FOR GERD. 10/02/23  Yes Babs Sciara, MD  pravastatin (PRAVACHOL) 40 MG tablet Take 1 tablet (40 mg total) by mouth daily. 01/14/23  Yes Luking, Scott A, MD  sucralfate (CARAFATE) 1 g tablet TAKE ONE (1) TABLET BY MOUTH 3 TIMES DAILY FOR GERD. Patient taking differently: Take 1 g by mouth 2 (two)  times daily. 01/27/22  Yes Luking, Jonna Coup, MD  WIXELA INHUB 250-50 MCG/ACT AEPB INHALE ONE PUFF BY MOUTH TWICE DAILY. 11/03/23  Yes  Babs Sciara, MD  ACCU-CHEK SOFTCLIX LANCETS lancets  11/15/13   [provider]  Alcohol Swabs (B-D SINGLE USE SWABS REGULAR) PADS  11/15/13   [provider]  Blood Glucose Calibration (ACCU-CHEK AVIVA) SOLN  11/15/13   [provider]  Blood Glucose Monitoring Suppl (ACCU-CHEK AVIVA PLUS) W/DEVICE KIT  11/15/13   [provider]  FREESTYLE LITE test strip USE TO TEST BLOOD GLUCOSE THREE TIMES DAILY 11/07/20   Babs Sciara, MD  Lancets (FREESTYLE) lancets Use to check blood sugar TID 11/08/20   Babs Sciara, MD       Melody Comas, MD [facility] Office: [phone]   See Amion for personal pager PCCM on call pager [phone] until 7pm. Please call Elink 7p-7a. [phone]

## 2023-12-26 NOTE — Evaluation (Signed)
 Physical Therapy Evaluation Patient Details Name: Roger Phillips MRN: [id number] DOB: 1956/07/28 Today's Date: 12/26/2023  History of Present Illness  68 yo male s/p endoscopic transnasal transsphenoidal resection of pituitary tumor on 3/21, symptoms of diplopia and proptosis. PMH includes OA, asthma, COPD with tobacco dependence, depression, DM, GERD, HTN, IBS, prostate cancer, ACDF 2013.  Clinical Impression   Pt presents with [facility] strength, balance, and activity tolerance, pt states he feels he is at mobility baseline. Pt's deficits are mostly related to diplopia, pt currently compensating for this by closing his R eye during mobility and has been doing this x4 weeks. Pt ambulatory for good hallway distance, navigated a flight of steps well. Pt eager to d/c home, PT discussed importance of scanning environment and taking his time with challenging situations (stairs, obstacles, etc). Pt with no further acute or post-acute PT needs, recommending acute OT to assess pt before d/c given vision deficits.          If plan is discharge home, recommend the following:     Can travel by private vehicle        Equipment Recommendations None recommended by PT  Recommendations for Other Services       Functional Status Assessment Patient has not had a recent decline in their functional status     Precautions / Restrictions Precautions Precautions: Fall Restrictions Weight Bearing Restrictions Per Provider Order: No      Mobility  Bed Mobility Overal bed mobility: Needs Assistance             General bed mobility comments: OOB    Transfers Overall transfer level: Modified independent Equipment used: None               General transfer comment: slow to rise, no physical assist    Ambulation/Gait Ambulation/Gait assistance: Supervision Gait Distance (Feet): 250 Feet Assistive device: Rolling walker (2 wheels), None Gait Pattern/deviations: Step-through pattern,  Decreased stride length Gait velocity: decr     General Gait Details: for safety, using RW initially but transitioned to no AD without difficulty or LOB  Stairs Stairs: Yes Stairs assistance: Supervision Stair Management: One rail Right, Alternating pattern, Forwards Number of Stairs: 10 General stair comments: for safety, cues to take his time and scan stairs given depth perception deficits (keeps R eye closed due to diplopia)  Wheelchair Mobility     Tilt Bed    Modified Rankin (Stroke Patients Only)       Balance Overall balance assessment: Needs assistance   Sitting balance-Leahy Scale: Good     Standing balance support: No upper extremity supported, During functional activity Standing balance-Leahy Scale: Good Standing balance comment: tolerates vertical and horizontal head turns, fast/slow gait, directional changes, and stairs                             Pertinent Vitals/Pain Pain Assessment Pain Assessment: Faces Faces Pain Scale: Hurts a little bit Pain Location: head Pain Descriptors / Indicators: Headache Pain Intervention(s): Limited activity within patient's tolerance, Repositioned, Monitored during session    Home Living Family/patient expects to be discharged to:: Private residence Living Arrangements: Spouse/significant other Available Help at Discharge: Family Type of Home: House Home Access: Stairs to enter   Secretary/administrator of Steps: 1   Home Layout: One level (3 steps to get from den to rest of house) Home Equipment: Agricultural consultant (2 wheels);Gilmer Mor - single point      Prior Function  Prior Level of Function : Independent/Modified Independent             Mobility Comments: pt reports no use of AD, independent, no falls       Extremity/Trunk Assessment   Upper Extremity Assessment Upper Extremity Assessment: Defer to OT evaluation    Lower Extremity Assessment Lower Extremity Assessment: Overall WFL for tasks  assessed    Cervical / Trunk Assessment Cervical / Trunk Assessment: Normal  Communication        Cognition Arousal: Alert Behavior During Therapy: WFL for tasks assessed/performed   PT - Cognitive impairments: No apparent impairments                                 Cueing Cueing Techniques: Verbal cues     General Comments General comments (skin integrity, edema, etc.): eye exam: smooth pursuits WFL L eye, inability to track laterally past midline R eye. diplopia (side-by-side) with both eyes open. + ptosis and proptosis R eye    Exercises     Assessment/Plan    PT Assessment Patient does not need any further PT services  PT Problem List         PT Treatment Interventions      PT Goals (Current goals can be found in the Care Plan section)  Acute Rehab PT Goals PT Goal Formulation: With patient Time For Goal Achievement: 12/26/23 Potential to Achieve Goals: Good    Frequency       Co-evaluation               AM-PAC PT "6 Clicks" Mobility  Outcome Measure Help needed turning from your back to your side while in a flat bed without using bedrails?: None Help needed moving from lying on your back to sitting on the side of a flat bed without using bedrails?: None Help needed moving to and from a bed to a chair (including a wheelchair)?: None Help needed standing up from a chair using your arms (e.g., wheelchair or bedside chair)?: None Help needed to walk in hospital room?: A Little Help needed climbing 3-5 steps with a railing? : A Little 6 Click Score: 22    End of Session   Activity Tolerance: Patient tolerated treatment well Patient left: with call bell/phone within reach;in chair;with family/visitor present Nurse Communication: Mobility status PT Visit Diagnosis: Other abnormalities of gait and mobility (R26.89);Muscle weakness (generalized) (M62.81)    Time: 1110-1130 PT Time Calculation (min) (ACUTE ONLY): 20 min   Charges:   PT  Evaluation $PT Eval Low Complexity: 1 Low   PT General Charges $$ ACUTE PT VISIT: 1 Visit         Marye Round, PT DPT Acute Rehabilitation Services Secure Chat Preferred  Office [phone]   Teneil Shiller Sheliah Plane 12/26/2023, 11:49 AM

## 2023-12-26 NOTE — Progress Notes (Signed)
 After summary visit reviewed with patient and spouse.  Questions answered.  All LDAs removed.  Patient discharge home with spouse.

## 2023-12-28 ENCOUNTER — Telehealth: Payer: Self-pay | Admitting: *Deleted

## 2023-12-28 ENCOUNTER — Encounter (HOSPITAL_COMMUNITY): Payer: Self-pay | Admitting: Neurosurgery

## 2023-12-28 LAB — SURGICAL PATHOLOGY

## 2023-12-28 NOTE — Transitions of Care (Post Inpatient/ED Visit) (Signed)
   12/28/2023  Name: Roger Phillips MRN: [id number] DOB: 1956/01/18  Today's TOC FU Call Status: Today's TOC FU Call Status:: Unsuccessful Call (1st Attempt) Unsuccessful Call (1st Attempt) Date: 12/28/23  Attempted to reach the patient regarding the most recent Inpatient/ED visit.  Follow Up Plan: Additional outreach attempts will be made to reach the patient to complete the Transitions of Care (Post Inpatient/ED visit) call.   Irving Shows [facility], BSN RN Care Manager/ Transition of Care [place]/ [facility] [phone]

## 2023-12-28 NOTE — Anesthesia Postprocedure Evaluation (Signed)
 Anesthesia Post Note  Patient: Roger Phillips  Procedure(s) Performed: CRANIOTOMY HYPOPHYSECTOMY TRANSNASAL APPROACH SINUS SURGERY, ENDOSCOPIC, USING COMPUTER-ASSISTED NAVIGATION SURGICAL PROCUREMENT, FAT GRAFT, ABDOMEN     Patient location during evaluation: PACU Anesthesia Type: General Level of consciousness: sedated and patient cooperative Pain management: pain level controlled Vital Signs Assessment: post-procedure vital signs reviewed and stable Respiratory status: spontaneous breathing Cardiovascular status: stable Anesthetic complications: no   No notable events documented.  Last Vitals:  Vitals:   12/26/23 1300 12/26/23 1400  BP: 117/61   Pulse: 68 69  Resp: 13 (!) 53  Temp:    SpO2: 96% 96%    Last Pain:  Vitals:   12/26/23 1200  TempSrc: Oral  PainSc: 0-No pain                 Lewie Loron

## 2023-12-30 ENCOUNTER — Telehealth: Payer: Self-pay | Admitting: *Deleted

## 2023-12-30 NOTE — Transitions of Care (Post Inpatient/ED Visit) (Signed)
   12/30/2023  Name: Roger Phillips MRN: [id number] DOB: [birth year]  Today's TOC FU Call Status: Today's TOC FU Call Status:: Unsuccessful Call (2nd Attempt) Unsuccessful Call (2nd Attempt) Date: 12/30/23  Attempted to reach the patient regarding the most recent Inpatient/ED visit.  Follow Up Plan: Additional outreach attempts will be made to reach the patient to complete the Transitions of Care (Post Inpatient/ED visit) call.   Irving Shows [facility], BSN RN Care Manager/ Transition of Care [place]/ [facility] [phone]

## 2023-12-31 ENCOUNTER — Telehealth: Payer: Self-pay | Admitting: *Deleted

## 2023-12-31 NOTE — Transitions of Care (Post Inpatient/ED Visit) (Signed)
   12/31/2023  Name: Roger Phillips MRN: [id number] DOB: 10-Mar-1956  Today's TOC FU Call Status: Today's TOC FU Call Status:: Unsuccessful Call (3rd Attempt) Unsuccessful Call (3rd Attempt) Date: 12/31/23  Attempted to reach the patient regarding the most recent Inpatient/ED visit.  Follow Up Plan: No further outreach attempts will be made at this time. We have been unable to contact the patient.  Irving Shows [facility], BSN RN Care Manager/ Transition of Care [place]/ [facility] [phone]

## 2024-01-04 ENCOUNTER — Telehealth: Payer: Self-pay | Admitting: Family Medicine

## 2024-01-04 ENCOUNTER — Other Ambulatory Visit: Payer: Self-pay | Admitting: Family Medicine

## 2024-01-04 NOTE — Telephone Encounter (Signed)
 Patient recently inhosptal for brain tumor removal He should have a follow up with neurosurgeon already He should do a medical follow up with our office within 30 days please

## 2024-01-07 ENCOUNTER — Encounter: Payer: Self-pay | Admitting: Family Medicine

## 2024-01-07 ENCOUNTER — Telehealth: Payer: Self-pay | Admitting: Family Medicine

## 2024-01-07 NOTE — Telephone Encounter (Signed)
 Front I sent the patient a MyChart message I would like to see him back 310 or 330 and one of the open slots.  I told him to come around 3:00 to be work then due to pedal edema thank you

## 2024-01-08 ENCOUNTER — Ambulatory Visit: Admitting: Family Medicine

## 2024-01-08 ENCOUNTER — Encounter: Payer: Self-pay | Admitting: Family Medicine

## 2024-01-08 VITALS — BP 130/70 | HR 69 | Temp 98.2°F | Ht 72.0 in | Wt 213.0 lb

## 2024-01-08 DIAGNOSIS — R600 Localized edema: Secondary | ICD-10-CM

## 2024-01-08 DIAGNOSIS — D497 Neoplasm of unspecified behavior of endocrine glands and other parts of nervous system: Secondary | ICD-10-CM | POA: Diagnosis not present

## 2024-01-08 MED ORDER — FUROSEMIDE 20 MG PO TABS: ORAL_TABLET | ORAL | 0 refills | Status: DC

## 2024-01-08 NOTE — Progress Notes (Signed)
 Subjective:    Patient ID: Roger Phillips, male    DOB: Sep 08, 1956, 68 y.o.   MRN: [id number]  HPI leg swelling   Follow up hospital -tumor removal  Discussed the use of AI scribe software for clinical note transcription with the patient, who gave verbal consent to proceed.  History of Present Illness   Roger Phillips is a 68 year old male who presents with post-surgical follow-up for double vision and leg swelling.  He is experiencing persistent double vision following recent surgery. The double vision is intermittent, and he is uncertain about the recovery of nerve function, which may be permanently affected. He uses an eye patch when driving to manage the double vision, which has impacted his depth perception, making tasks like reaching for objects challenging.  He reports swelling and stiffness in his legs, which he attributes to fluid retention possibly related to steroid use and recent surgery. He experiences pain and stiffness in his toes and fingers, and his feet are often cold. He is currently taking a diuretic to manage the swelling and plans to take it in the morning to avoid frequent nighttime urination.  He has a history of heel spur in his foot, which has been painful for fifteen years, and he has received injections in the past for this condition.        Review of Systems     Objective:   Physical Exam  General-in no acute distress Eyes-no discharge Lungs-respiratory rate normal, CTA CV-no murmurs,RRR Extremities skin warm dry no edema Neuro grossly normal Behavior normal, alert On EOMI patient cannot turn the right eye laterally.  He also complains of blurred vision in the right eye.  He also has limited range of motion looking upward to the right and downward to the right.  Also complains of double vision.  I suspect he has some level of nerve damage where the tumor was located.  I also feel that he more than likely has some permanent loss of extraocular  muscle Perhaps ophthalmology can fit him with prism glasses or perhaps recommend a good strategy for patching Patient currently not driving but when he does resume driving given his double vision he may well at the patch his right eye and then make extra certain he scans range of motion left and right with his singular eyesight.  In addition to this patient was counseled if he does resume driving with just monocular vision that he stick with daytime driving for now and taken to affect his depth perception will not be good.  Currently cannot drive until neurosurgery releases him    Assessment & Plan:  Assessment and Plan    Diplopia Post-surgical diplopia with potential permanent nerve damage. Discussed eye patch use for driving and glasses for vision convergence. Advised on legal and safety aspects of driving with one eye. - Refer to ophthalmology for further evaluation. - Ensure follow-up with neurosurgeon before driving clearance.  Peripheral Edema Peripheral edema likely due to recent surgery and steroid use. Diuretic prescribed to reduce swelling. - Prescribe diuretic to reduce swelling. - Advise taking diuretic in the morning to avoid nocturia.  Chronic Foot Pain Chronic foot pain for 15 years, possibly due to a heel spur.  Follow-up Plan for follow-up to monitor conditions and adjust treatment as necessary. Prioritize ophthalmology and neurosurgery consultations. - Schedule follow-up appointment in 2-3 weeks to recheck legs. - Coordinate follow-up with ophthalmology and neurosurgeon.     Furosemide 1 each morning until swelling  is better Follow-up in a few weeks Consider doing metabolic 7 at follow-up   We will work toward getting an appointment with ophthalmology Patient to follow-up with neurosurgery next week

## 2024-01-11 ENCOUNTER — Other Ambulatory Visit: Payer: Self-pay

## 2024-01-11 DIAGNOSIS — H532 Diplopia: Secondary | ICD-10-CM

## 2024-01-29 ENCOUNTER — Other Ambulatory Visit: Payer: Self-pay | Admitting: Family Medicine

## 2024-02-02 ENCOUNTER — Other Ambulatory Visit: Payer: Self-pay | Admitting: Family Medicine

## 2024-02-02 DIAGNOSIS — I10 Essential (primary) hypertension: Secondary | ICD-10-CM

## 2024-02-05 ENCOUNTER — Ambulatory Visit: Admitting: Physician Assistant

## 2024-02-19 ENCOUNTER — Encounter: Payer: Self-pay | Admitting: Urology

## 2024-02-19 ENCOUNTER — Ambulatory Visit: Payer: PPO | Admitting: Urology

## 2024-02-19 VITALS — BP 107/64 | HR 79

## 2024-02-19 DIAGNOSIS — N138 Other obstructive and reflux uropathy: Secondary | ICD-10-CM | POA: Diagnosis not present

## 2024-02-19 DIAGNOSIS — R300 Dysuria: Secondary | ICD-10-CM | POA: Diagnosis not present

## 2024-02-19 DIAGNOSIS — R351 Nocturia: Secondary | ICD-10-CM | POA: Diagnosis not present

## 2024-02-19 DIAGNOSIS — N401 Enlarged prostate with lower urinary tract symptoms: Secondary | ICD-10-CM | POA: Diagnosis not present

## 2024-02-19 LAB — MICROSCOPIC EXAMINATION: RBC, Urine: >30 /HPF — AB (ref 0–2)

## 2024-02-19 LAB — URINALYSIS, ROUTINE W REFLEX MICROSCOPIC
Bilirubin, UA: NEGATIVE
Glucose, UA: NEGATIVE
Ketones, UA: NEGATIVE
Nitrite, UA: POSITIVE — AB
Specific Gravity, UA: 1.025 (ref 1.005–1.030)
Urobilinogen, Ur: 1 mg/dL (ref 0.2–1.0)
pH, UA: 6 (ref 5.0–7.5)

## 2024-02-19 LAB — BLADDER SCAN AMB NON-IMAGING: Scan Result: 9

## 2024-02-19 MED ORDER — SOLIFENACIN SUCCINATE 5 MG PO TABS: 5.0000 mg | ORAL_TABLET | Freq: Every day | ORAL | 3 refills | Status: DC

## 2024-02-19 MED ORDER — CEFUROXIME AXETIL 500 MG PO TABS: 500.0000 mg | ORAL_TABLET | Freq: Two times a day (BID) | ORAL | 0 refills | Status: DC

## 2024-02-19 NOTE — Addendum Note (Signed)
 Addended by: Roselee Cong on: 02/19/2024 12:42 PM   Modules accepted: Orders

## 2024-02-19 NOTE — Progress Notes (Signed)
 02/19/2024 12:16 PM   Roger Phillips June 10, 1956 [id number]  Referring provider: Bennet Brasil, MD [address] [place],  [place] [address]  Urinary frequency   HPI: Mr Marberry is a 67yo here for followup for BPh with urinary frequency. He had a brain tumor removed 3/25 and since the procedure he has noted worsening urinary urgency, frequency and urge incontinence. He had a foley in at the time of his procedure. He passed a small blood clot this morning   PMH: Past Medical History:  Diagnosis Date   Arthritis    neck and left shoulder   Asthma    Back pain    buldging disc   Complication of anesthesia    difficult to wake up after neck surgery   COPD (chronic obstructive pulmonary disease) (HCC)    Depression    takes Wellbutrin    Diabetes mellitus    takes Metformin  bid   Emphysema    GERD (gastroesophageal reflux disease)    takes Omeprazole  daily   Hemorrhoids    High cholesterol    takes Pravastatin  daily   HTN (hypertension)    takes Lisinopril ,Norvasc ,and HCTZ daily   IBS (irritable bowel syndrome)    Insomnia    doesn't take any meds    Pneumonia    hx of in 80's and 90's   Prostate cancer (HCC)    Sinus headache    sinus drainage    Surgical History: Past Surgical History:  Procedure Laterality Date   ABDOMINAL FAT GRAPH N/A 12/25/2023   Procedure: SURGICAL PROCUREMENT, FAT GRAFT, ABDOMEN;  Surgeon: Virgina Grills, MD;  Location: [facility] OR;  Service: ENT;  Laterality: N/A;   ANTERIOR CERVICAL DECOMP/DISCECTOMY FUSION  12/04/2011   Procedure: ANTERIOR CERVICAL DECOMPRESSION/DISCECTOMY FUSION 2 LEVELS;  Surgeon: Corrina Dimitri, MD;  Location: [facility]UR[facility] ORS;  Service: Neurosurgery;  Laterality: N/A;  Cervical Three-Four, Cervical Four-Five anterior cervial decompression with fusion plating and bonegraft   CARPAL TUNNEL RELEASE  10+yrs   right side   COLONOSCOPY N/A 04/19/2014   Procedure: COLONOSCOPY;  Surgeon: Ruby Corporal, MD;  Location: [facility]  ENDO SUITE;  Service: Endoscopy;  Laterality: N/A;  1030-moved to 1230 Ann to notify pt   CRANIOTOMY N/A 12/25/2023   Procedure: CRANIOTOMY HYPOPHYSECTOMY TRANSNASAL APPROACH;  Surgeon: Augusto Blonder, MD;  Location: [facility] OR;  Service: Neurosurgery;  Laterality: N/A;  ENDOSCOPIC TRANSSPHENOIDAL RESECTION OF PITUITARY TUMOR   CYSTECTOMY  12-34yrs ago   from left side of neck   CYSTOSCOPY WITH URETHRAL DILATATION N/A 09/22/2022   Procedure: CYSTOSCOPY WITH URETHRAL DILATATION-optilume;  Surgeon: Marco Severs, MD;  Location: [facility] ORS;  Service: Urology;  Laterality: N/A;   GOLD SEED IMPLANT N/A 12/06/2020   Procedure: GOLD SEED IMPLANT;  Surgeon: Marco Severs, MD;  Location: [facility] ORS;  Service: Urology;  Laterality: N/A;   SINUS ENDO W/FUSION N/A 12/25/2023   Procedure: SINUS SURGERY, ENDOSCOPIC, USING COMPUTER-ASSISTED NAVIGATION;  Surgeon: Virgina Grills, MD;  Location: [facility] OR;  Service: ENT;  Laterality: N/A;   SPACE OAR INSTILLATION N/A 12/06/2020   Procedure: SPACE OAR INSTILLATION;  Surgeon: Marco Severs, MD;  Location: [facility] ORS;  Service: Urology;  Laterality: N/A;    Home Medications:  Allergies as of 02/19/2024       Reactions   Doxycycline  Diarrhea   Morphine  And Codeine Other (See Comments)   Insomnia   Sulfa Antibiotics Rash        Medication List  Accurate as of Feb 19, 2024 12:16 PM. If you have any questions, ask your nurse or doctor.          Accu-Chek Aviva Plus w/Device Kit   Accu-Chek Aviva Soln   Accu-Chek Softclix Lancets lancets   freestyle lancets Use to check blood sugar TID   albuterol  108 (90 Base) MCG/ACT inhaler Commonly known as: VENTOLIN  HFA 2 puff q 4 prn What changed:  how much to take how to take this when to take this reasons to take this   aspirin EC 81 MG tablet Take 81 mg by mouth daily. Swallow whole.   B-D SINGLE USE SWABS REGULAR Pads   diphenhydrAMINE  25 MG tablet Commonly known as: BENADRYL  Take 25 mg  by mouth daily.   diphenoxylate -atropine  2.5-0.025 MG tablet Commonly known as: LOMOTIL  TAKE ONE TABLET BY MOUTH TWICE A DAY AS NEEDED FOR DIARRHEA DUE TO PROSTATE RADIATION.   FREESTYLE LITE test strip Generic drug: glucose blood USE TO TEST BLOOD GLUCOSE THREE TIMES DAILY   furosemide  20 MG tablet Commonly known as: LASIX  1 qam prn pedal edema   glipiZIDE  10 MG tablet Commonly known as: GLUCOTROL  TAKE ONE TABLET BY MOUTH TWICE A DAY   hydrocortisone  10 MG tablet Commonly known as: CORTEF  Take 1 tablet (10 mg total) by mouth every evening.   hydrocortisone  20 MG tablet Commonly known as: CORTEF  Take 1 tablet (20 mg total) by mouth every morning.   losartan  50 MG tablet Commonly known as: COZAAR  TAKE 1 TABLET BY MOUTH DAILY.   omeprazole  20 MG capsule Commonly known as: PRILOSEC TAKE 1 capsule BY MOUTH TWICE DAILY FOR GERD.   pravastatin  40 MG tablet Commonly known as: PRAVACHOL  Take 1 tablet (40 mg total) by mouth daily.   sucralfate  1 g tablet Commonly known as: CARAFATE  TAKE ONE (1) TABLET BY MOUTH 3 TIMES DAILY FOR GERD. What changed: See the new instructions.   Wixela Inhub 250-50 MCG/ACT Aepb Generic drug: fluticasone -salmeterol INHALE ONE PUFF BY MOUTH TWICE DAILY.        Allergies:  Allergies  Allergen Reactions   Doxycycline  Diarrhea   Morphine  And Codeine Other (See Comments)    Insomnia    Sulfa Antibiotics Rash    Family History: Family History  Problem Relation Age of Onset   Heart disease Father    Hypertension Father    Heart disease Brother    Anesthesia problems Neg Hx    Hypotension Neg Hx    Malignant hyperthermia Neg Hx    Pseudochol deficiency Neg Hx    Breast cancer Neg Hx    Prostate cancer Neg Hx    Colon cancer Neg Hx    Pancreatic cancer Neg Hx     Social History:  reports that he has been smoking cigarettes. He has a 60 pack-year smoking history. He has never used smokeless tobacco. He reports current alcohol use  of about 6.0 standard drinks of alcohol per week. He reports that he does not use drugs.  ROS: All other review of systems were reviewed and are negative except what is noted above in HPI  Physical Exam: BP 107/64   Pulse 79   Constitutional:  Alert and oriented, No acute distress. HEENT: Corn AT, moist mucus membranes.  Trachea midline, no masses. Cardiovascular: No clubbing, cyanosis, or edema. Respiratory: Normal respiratory effort, no increased work of breathing. GI: Abdomen is soft, nontender, nondistended, no abdominal masses GU: No CVA tenderness.  Lymph: No cervical or inguinal lymphadenopathy. Skin: No  rashes, bruises or suspicious lesions. Neurologic: Grossly intact, no focal deficits, moving all 4 extremities. Psychiatric: Normal mood and affect.  Laboratory Data: Lab Results  Component Value Date   WBC 5.1 12/25/2023   HGB 10.4 (L) 12/25/2023   HCT 30.2 (L) 12/25/2023   MCV 90.1 12/25/2023   PLT 161 12/25/2023    Lab Results  Component Value Date   CREATININE 1.38 (H) 12/26/2023    Lab Results  Component Value Date   PSA 3.68 07/07/2013    No results found for: "TESTOSTERONE"  Lab Results  Component Value Date   HGBA1C 6.9 (H) 12/23/2023    Urinalysis    Component Value Date/Time   APPEARANCEUR Clear 11/26/2022 1118   GLUCOSEU Negative 11/26/2022 1118   BILIRUBINUR Negative 11/26/2022 1118   PROTEINUR 1+ (A) 11/26/2022 1118   NITRITE Negative 11/26/2022 1118   LEUKOCYTESUR Negative 11/26/2022 1118    Lab Results  Component Value Date   LABMICR See below: 11/26/2022   WBCUA 0-5 11/26/2022   LABEPIT 0-10 11/26/2022   MUCUS Present 05/28/2022   BACTERIA None seen 11/26/2022    Pertinent Imaging:  Results for orders placed in visit on 08/21/23  Abdomen 1 view (KUB)  Narrative CLINICAL DATA:  Right-sided kidney stone.  EXAM: ABDOMEN - 1 VIEW  COMPARISON:  CT 04/21/2022  FINDINGS: Potential 5 mm stone in the upper pole of the right  kidney. The additional renal calculi on prior CT are not well demonstrated by radiograph. The known gallstones are not well seen. Normal bowel gas pattern with small to moderate colonic stool burden. Surgical clips and phleboliths in the pelvis. No acute osseous findings.  IMPRESSION: Potential 5 mm stone in the upper pole of the right kidney. The majority of the renal calculi on prior CT are not well demonstrated by radiograph.   Electronically Signed By: Chadwick Colonel M.D. On: 09/06/2023 17:36  No results found for this or any previous visit.  No results found for this or any previous visit.  No results found for this or any previous visit.  No results found for this or any previous visit.  No results found for this or any previous visit.  No results found for this or any previous visit.  Results for orders placed during the hospital encounter of 04/21/22  CT RENAL STONE STUDY  Narrative CLINICAL DATA:  Nephrolithiasis.  Dysuria.  EXAM: CT ABDOMEN AND PELVIS WITHOUT CONTRAST  TECHNIQUE: Multidetector CT imaging of the abdomen and pelvis was performed following the standard protocol without IV contrast.  RADIATION DOSE REDUCTION: This exam was performed according to the departmental dose-optimization program which includes automated exposure control, adjustment of the mA and/or kV according to patient size and/or use of iterative reconstruction technique.  COMPARISON:  Overlapping portions CT chest 07/28/2018  FINDINGS: Lower chest: Lingular bulla, image 6 series 4. Mild scarring or atelectasis in the right lower lobe. Descending thoracic aortic atherosclerosis and circumflex coronary artery atherosclerosis.  Hepatobiliary: Multiple gallstones measuring up to 0.7 cm in diameter. No biliary dilatation. Otherwise unremarkable.  Pancreas: Unremarkable  Spleen: Unremarkable  Adrenals/Urinary Tract: Both adrenal glands appear normal.  There are proximally  8 nonobstructive right renal calculi, one of the largest in the right kidney lower pole measures 0.8 cm in long axis.  Two left kidney lower pole calculi are identified, the larger measuring 0.5 cm in short axis.  No hydronephrosis or hydroureter. No ureteral or bladder calculus identified. Wall thickening in the urinary bladder is least partially  from nondistention but there could certainly be a component of cystitis.  Stomach/Bowel: Unremarkable  Vascular/Lymphatic: Atherosclerosis is present, including aortoiliac atherosclerotic disease. No pathologic adenopathy observed.  Reproductive: Prostatomegaly. Fiducials noted along the prostate margins.  Other: No supplemental non-categorized findings.  Musculoskeletal: Mild bridging spurring anteriorly in the sacroiliac joints. Mild bilateral degenerative hip arthropathy. Lumbar spondylosis and degenerative disc disease causing impingement especially at L4-5.  IMPRESSION: 1. Bilateral nonobstructive renal calculi. No hydronephrosis, hydroureter, or ureteral/bladder calculus. 2. Bladder wall thickening may partially be due to nondistention but superimposed cystitis is not excluded. 3. Prostatomegaly with fiducials in place. 4. Other imaging findings of potential clinical significance: Aortic Atherosclerosis (ICD10-I70.0). Coronary atherosclerosis. Cholelithiasis. Mild bilateral degenerative hip arthropathy. Lumbar impingement especially at L4-5.   Electronically Signed By: Freida Jes M.D. On: 04/22/2022 14:52   Assessment & Plan:    1. Benign prostatic hyperplasia with urinary obstruction (Primary) We will trial vesicare 5mg   - Urinalysis, Routine w reflex microscopic - BLADDER SCAN AMB NON-IMAGING  2. Nocturia Vesicare 5mg  daily  3. Burning with urination -ceftin 500mg  BID for 7 days   No follow-ups on file.  Johnie Nailer, MD  [facility] [place]

## 2024-02-19 NOTE — Progress Notes (Signed)
post void residual=9 °

## 2024-02-19 NOTE — Patient Instructions (Signed)

## 2024-02-21 ENCOUNTER — Encounter: Payer: Self-pay | Admitting: Urology

## 2024-02-22 ENCOUNTER — Telehealth: Payer: Self-pay | Admitting: Urology

## 2024-02-22 ENCOUNTER — Other Ambulatory Visit: Payer: Self-pay | Admitting: Urology

## 2024-02-22 DIAGNOSIS — R399 Unspecified symptoms and signs involving the genitourinary system: Secondary | ICD-10-CM

## 2024-02-22 MED ORDER — NITROFURANTOIN MONOHYD MACRO 100 MG PO CAPS: 100.0000 mg | ORAL_CAPSULE | Freq: Two times a day (BID) | ORAL | 0 refills | Status: AC

## 2024-02-22 NOTE — Telephone Encounter (Signed)
 Pt c/o medication issue:  1. Name of Medication: Ceftin   2. How are you currently taking this medication (dosage and times per day)?   3. Are you having a reaction (difficulty breathing--STAT)? Yes   4. What is your medication issue? Vomits everytime he takes the antibiotic (he does this with all antibiotics) he doesn't have a problem with they are in IV form?   Patient wife said he stopped taking the antibiotic 2 days ago because he has not been able to keep anything down or eat after taking them.  I let her know that you called in the Macrobid  to the pharmacy

## 2024-02-22 NOTE — Telephone Encounter (Signed)
 Patient currently taking Ceftin .  Can you recommend an alternative rx in Dr. Mozell Arias absence.

## 2024-02-23 LAB — URINE CULTURE

## 2024-02-23 NOTE — Telephone Encounter (Signed)
 Patient was notified via mychart yesterday of an alternative abx that had been sent to his pharmacy.

## 2024-02-24 ENCOUNTER — Other Ambulatory Visit: Payer: Self-pay | Admitting: Family Medicine

## 2024-02-25 ENCOUNTER — Other Ambulatory Visit: Payer: Self-pay

## 2024-02-25 MED ORDER — ALBUTEROL SULFATE HFA 108 (90 BASE) MCG/ACT IN AERS: INHALATION_SPRAY | RESPIRATORY_TRACT | 12 refills | Status: DC

## 2024-03-23 ENCOUNTER — Other Ambulatory Visit (HOSPITAL_COMMUNITY): Payer: Self-pay | Admitting: Surgery

## 2024-03-23 DIAGNOSIS — D352 Benign neoplasm of pituitary gland: Secondary | ICD-10-CM

## 2024-03-26 ENCOUNTER — Other Ambulatory Visit: Payer: Self-pay | Admitting: Family Medicine

## 2024-04-07 ENCOUNTER — Ambulatory Visit (HOSPITAL_COMMUNITY)
Admission: RE | Admit: 2024-04-07 | Discharge: 2024-04-07 | Disposition: A | Discharge status: Home / Self Care | Source: Ambulatory Visit | Attending: Surgery | Admitting: Surgery

## 2024-04-07 DIAGNOSIS — D352 Benign neoplasm of pituitary gland: Secondary | ICD-10-CM | POA: Diagnosis not present

## 2024-04-07 DIAGNOSIS — I6782 Cerebral ischemia: Secondary | ICD-10-CM | POA: Diagnosis not present

## 2024-04-07 MED ORDER — GADOBUTROL 1 MMOL/ML IV SOLN
10.0000 mL | Freq: Once | INTRAVENOUS | Status: AC | PRN
  Administered 2024-04-07: 10 mL via INTRAVENOUS

## 2024-04-11 ENCOUNTER — Encounter: Payer: Self-pay | Admitting: Family Medicine

## 2024-04-13 ENCOUNTER — Other Ambulatory Visit: Payer: Self-pay | Admitting: Family Medicine

## 2024-04-13 ENCOUNTER — Telehealth: Payer: Self-pay

## 2024-04-13 NOTE — Telephone Encounter (Signed)
 LVM and mailed letter to home to schedule annual Lung CT.

## 2024-04-20 ENCOUNTER — Other Ambulatory Visit: Payer: Self-pay | Admitting: Acute Care

## 2024-04-20 DIAGNOSIS — Z122 Encounter for screening for malignant neoplasm of respiratory organs: Secondary | ICD-10-CM

## 2024-04-20 DIAGNOSIS — F17210 Nicotine dependence, cigarettes, uncomplicated: Secondary | ICD-10-CM

## 2024-04-20 DIAGNOSIS — Z87891 Personal history of nicotine dependence: Secondary | ICD-10-CM

## 2024-04-25 DIAGNOSIS — D352 Benign neoplasm of pituitary gland: Secondary | ICD-10-CM | POA: Diagnosis not present

## 2024-04-29 ENCOUNTER — Ambulatory Visit (HOSPITAL_COMMUNITY)
Admission: RE | Admit: 2024-04-29 | Discharge: 2024-04-29 | Disposition: A | Discharge status: Home / Self Care | Source: Ambulatory Visit | Attending: Acute Care | Admitting: Acute Care

## 2024-04-29 DIAGNOSIS — Z122 Encounter for screening for malignant neoplasm of respiratory organs: Secondary | ICD-10-CM | POA: Insufficient documentation

## 2024-04-29 DIAGNOSIS — F17210 Nicotine dependence, cigarettes, uncomplicated: Secondary | ICD-10-CM | POA: Diagnosis not present

## 2024-04-29 DIAGNOSIS — Z87891 Personal history of nicotine dependence: Secondary | ICD-10-CM | POA: Insufficient documentation

## 2024-05-04 ENCOUNTER — Encounter: Payer: Self-pay | Admitting: Family Medicine

## 2024-05-09 ENCOUNTER — Other Ambulatory Visit: Payer: Self-pay

## 2024-05-09 DIAGNOSIS — Z122 Encounter for screening for malignant neoplasm of respiratory organs: Secondary | ICD-10-CM

## 2024-05-09 DIAGNOSIS — F17210 Nicotine dependence, cigarettes, uncomplicated: Secondary | ICD-10-CM

## 2024-05-09 DIAGNOSIS — Z87891 Personal history of nicotine dependence: Secondary | ICD-10-CM

## 2024-05-11 ENCOUNTER — Other Ambulatory Visit: Payer: Self-pay | Admitting: Family Medicine

## 2024-05-11 DIAGNOSIS — I10 Essential (primary) hypertension: Secondary | ICD-10-CM

## 2024-05-15 ENCOUNTER — Other Ambulatory Visit: Payer: Self-pay | Admitting: Family Medicine

## 2024-05-18 ENCOUNTER — Ambulatory Visit: Admitting: Urology

## 2024-05-18 ENCOUNTER — Ambulatory Visit: Admitting: Family Medicine

## 2024-05-18 VITALS — BP 121/63 | HR 66 | Temp 98.2°F | Ht 72.0 in | Wt 176.0 lb

## 2024-05-18 DIAGNOSIS — R110 Nausea: Secondary | ICD-10-CM | POA: Diagnosis not present

## 2024-05-18 DIAGNOSIS — D509 Iron deficiency anemia, unspecified: Secondary | ICD-10-CM | POA: Diagnosis not present

## 2024-05-18 DIAGNOSIS — R634 Abnormal weight loss: Secondary | ICD-10-CM | POA: Diagnosis not present

## 2024-05-18 DIAGNOSIS — E785 Hyperlipidemia, unspecified: Secondary | ICD-10-CM | POA: Diagnosis not present

## 2024-05-18 DIAGNOSIS — R490 Dysphonia: Secondary | ICD-10-CM | POA: Diagnosis not present

## 2024-05-18 DIAGNOSIS — E1165 Type 2 diabetes mellitus with hyperglycemia: Secondary | ICD-10-CM

## 2024-05-18 DIAGNOSIS — E1169 Type 2 diabetes mellitus with other specified complication: Secondary | ICD-10-CM | POA: Diagnosis not present

## 2024-05-18 NOTE — Progress Notes (Signed)
   Subjective:    Patient ID: Roger Phillips, male    DOB: [birth year], 68 y.o.   MRN: [id number]  HPI Patient has been losing weight Not really interested in eating Energy level subpar Some nausea Some abdominal bloating and slight discomfort but no severe pain Denies rectal bleeding or hematuria  Patient also relates some hoarseness Does have a history of smoking and alcohol use Denies dysphagia Wonders if he needs to have his esophagus stretched but denies food getting stuck His weight has gone down dramatically over 25 pounds in the past 4 months. I am concerned about intra-abdominal pathology May will need scope test but start off with CT scan  Review of Systems     Objective:   Physical Exam  General-in no acute distress Eyes-no discharge Lungs-respiratory rate normal, CTA CV-no murmurs,RRR Extremities skin warm dry no edema Neuro grossly normal Behavior normal, alert Abdomen is soft      Assessment & Plan:   1. Hyperlipidemia associated with type 2 diabetes mellitus (HCC) (Primary) Check lipid profile continue meds - Lipid Panel  2. Uncontrolled type 2 diabetes mellitus with hyperglycemia (HCC) Continue meds check A1c follow through with endocrinology - Hemoglobin A1c - Hepatic Function Panel  3. Weight loss Significant weight loss will push forward with doing a CT scan of the abdomen pelvis - Basic Metabolic Panel - Hepatic Function Panel  4. Nausea Will push forward with CT scan abdomen pelvis with contrast concerning for possibility of malignancy versus other issues  5. Hoarseness Hoarseness needs to be checked out by the ENT will set up a referral patient has history of smoking and drinking  6. Iron deficiency anemia, unspecified iron deficiency anemia type Check labs await results - CBC with Differential  Recheck 4 weeks  We will go ahead and push forward for a CT scan.  Staff will progress with getting this ordered up for this coming  week

## 2024-05-19 ENCOUNTER — Ambulatory Visit: Payer: Self-pay | Admitting: Family Medicine

## 2024-05-19 ENCOUNTER — Ambulatory Visit: Admitting: Urology

## 2024-05-19 ENCOUNTER — Telehealth: Payer: Self-pay | Admitting: Family Medicine

## 2024-05-19 LAB — CBC WITH DIFFERENTIAL/PLATELET
Basophils Absolute: 0.2 x10E3/uL (ref 0.0–0.2)
Basos: 2 %
EOS (ABSOLUTE): 0.6 x10E3/uL — ABNORMAL HIGH (ref 0.0–0.4)
Eos: 9 %
Hematocrit: 37.8 % (ref 37.5–51.0)
Hemoglobin: 12.1 g/dL — ABNORMAL LOW (ref 13.0–17.7)
Immature Grans (Abs): 0 x10E3/uL (ref 0.0–0.1)
Immature Granulocytes: 0 %
Lymphocytes Absolute: 1.1 x10E3/uL (ref 0.7–3.1)
Lymphs: 15 %
MCH: 28.9 pg (ref 26.6–33.0)
MCHC: 32 g/dL (ref 31.5–35.7)
MCV: 90 fL (ref 79–97)
Monocytes Absolute: 0.4 x10E3/uL (ref 0.1–0.9)
Monocytes: 6 %
Neutrophils Absolute: 5 x10E3/uL (ref 1.4–7.0)
Neutrophils: 68 %
Platelets: 292 x10E3/uL (ref 150–450)
RBC: 4.18 x10E6/uL (ref 4.14–5.80)
RDW: 16.8 % — ABNORMAL HIGH (ref 11.6–15.4)
WBC: 7.4 x10E3/uL (ref 3.4–10.8)

## 2024-05-19 LAB — HEPATIC FUNCTION PANEL
ALT: 10 IU/L (ref 0–44)
AST: 14 IU/L (ref 0–40)
Albumin: 4.2 g/dL (ref 3.9–4.9)
Alkaline Phosphatase: 83 IU/L (ref 44–121)
Bilirubin Total: 0.4 mg/dL (ref 0.0–1.2)
Bilirubin, Direct: 0.13 mg/dL (ref 0.00–0.40)
Total Protein: 7.2 g/dL (ref 6.0–8.5)

## 2024-05-19 LAB — LIPID PANEL
Chol/HDL Ratio: 3.9 ratio (ref 0.0–5.0)
Cholesterol, Total: 153 mg/dL (ref 100–199)
HDL: 39 mg/dL — ABNORMAL LOW (ref >39)
LDL Chol Calc (NIH): 90 mg/dL (ref 0–99)
Triglycerides: 133 mg/dL (ref 0–149)
VLDL Cholesterol Cal: 24 mg/dL (ref 5–40)

## 2024-05-19 LAB — BASIC METABOLIC PANEL WITH GFR
BUN/Creatinine Ratio: 26 — ABNORMAL HIGH (ref 10–24)
BUN: 29 mg/dL — ABNORMAL HIGH (ref 8–27)
CO2: 20 mmol/L (ref 20–29)
Calcium: 9.1 mg/dL (ref 8.6–10.2)
Chloride: 103 mmol/L (ref 96–106)
Creatinine, Ser: 1.12 mg/dL (ref 0.76–1.27)
Glucose: 99 mg/dL (ref 70–99)
Potassium: 4.6 mmol/L (ref 3.5–5.2)
Sodium: 140 mmol/L (ref 134–144)
eGFR: 71 mL/min/1.73 (ref >59)

## 2024-05-19 LAB — HEMOGLOBIN A1C
Est. average glucose Bld gHb Est-mCnc: 105 mg/dL
Hgb A1c MFr Bld: 5.3 % (ref 4.8–5.6)

## 2024-05-19 NOTE — Telephone Encounter (Signed)
 Nurses Patient was seen yesterday Please see his lab work Patient would benefit from CT scan abdomen pelvis with contrast Please touch base with patient and let him know we are ordering this This is because he has had abdominal discomforts as well as nausea and unintended weight loss  (I would prefer for the CAT scan to be completed next week and possible if it cannot be completed next week the following week and would like to have results read as stat-she does not need to have the CT scan today or tomorrow thank you)

## 2024-05-20 ENCOUNTER — Other Ambulatory Visit: Payer: Self-pay

## 2024-05-20 DIAGNOSIS — R109 Unspecified abdominal pain: Secondary | ICD-10-CM

## 2024-05-20 DIAGNOSIS — R110 Nausea: Secondary | ICD-10-CM

## 2024-05-20 DIAGNOSIS — R634 Abnormal weight loss: Secondary | ICD-10-CM

## 2024-05-27 ENCOUNTER — Encounter (HOSPITAL_COMMUNITY): Payer: Self-pay

## 2024-05-27 ENCOUNTER — Ambulatory Visit (HOSPITAL_COMMUNITY): Attending: Family Medicine

## 2024-06-08 ENCOUNTER — Encounter: Payer: Self-pay | Admitting: Family Medicine

## 2024-06-08 ENCOUNTER — Ambulatory Visit: Admitting: Family Medicine

## 2024-06-09 ENCOUNTER — Encounter: Payer: Self-pay | Admitting: Family Medicine

## 2024-06-09 ENCOUNTER — Ambulatory Visit: Payer: Self-pay | Admitting: Family Medicine

## 2024-06-09 ENCOUNTER — Ambulatory Visit (INDEPENDENT_AMBULATORY_CARE_PROVIDER_SITE_OTHER): Admitting: Family Medicine

## 2024-06-09 VITALS — BP 126/77 | HR 71 | Temp 97.7°F | Wt 172.0 lb

## 2024-06-09 DIAGNOSIS — D352 Benign neoplasm of pituitary gland: Secondary | ICD-10-CM

## 2024-06-09 DIAGNOSIS — I10 Essential (primary) hypertension: Secondary | ICD-10-CM | POA: Diagnosis not present

## 2024-06-09 DIAGNOSIS — E1169 Type 2 diabetes mellitus with other specified complication: Secondary | ICD-10-CM | POA: Diagnosis not present

## 2024-06-09 DIAGNOSIS — E785 Hyperlipidemia, unspecified: Secondary | ICD-10-CM | POA: Diagnosis not present

## 2024-06-09 DIAGNOSIS — F321 Major depressive disorder, single episode, moderate: Secondary | ICD-10-CM

## 2024-06-09 DIAGNOSIS — R634 Abnormal weight loss: Secondary | ICD-10-CM | POA: Diagnosis not present

## 2024-06-09 DIAGNOSIS — Z23 Encounter for immunization: Secondary | ICD-10-CM | POA: Diagnosis not present

## 2024-06-09 DIAGNOSIS — N451 Epididymitis: Secondary | ICD-10-CM | POA: Diagnosis not present

## 2024-06-09 DIAGNOSIS — R109 Unspecified abdominal pain: Secondary | ICD-10-CM

## 2024-06-09 LAB — POCT URINALYSIS DIP (CLINITEK)
Bilirubin, UA: NEGATIVE
Glucose, UA: NEGATIVE mg/dL
Ketones, POC UA: NEGATIVE mg/dL
Nitrite, UA: POSITIVE — AB
POC PROTEIN,UA: 100 — AB
Spec Grav, UA: 1.02 (ref 1.010–1.025)
Urobilinogen, UA: 0.2 U/dL
pH, UA: 6 (ref 5.0–8.0)

## 2024-06-09 MED ORDER — LEVOFLOXACIN 500 MG PO TABS: 500.0000 mg | ORAL_TABLET | Freq: Every day | ORAL | 0 refills | Status: DC

## 2024-06-09 MED ORDER — LOSARTAN POTASSIUM 25 MG PO TABS: 25.0000 mg | ORAL_TABLET | Freq: Every day | ORAL | 1 refills | Status: DC

## 2024-06-09 NOTE — Progress Notes (Signed)
 Subjective:    Patient ID: Roger Phillips, male    DOB: Feb 13, 1956, 68 y.o.   MRN: [id number]  HPI Right side testicle swelling and right side kideney possible stones w hx of kidney stones Swelling going on for about a week   Roger Phillips is a 68 year old male who presents with swelling and pain from the testicle to the groin and upper back.  Swelling and pain began approximately seven days ago, starting last Wednesday. The pain radiates from the testicle up into the groin and around to the upper back, near the kidney area. The pain is intermittent, with periods of relief. No nausea, fever, or chills. Appetite remains stable, and there have been no recent low blood sugar episodes.  A CT scan was previously scheduled but was missed due to an emergency with his dog. He attempted to notify the facility but was unable to reach anyone. He reports low energy levels, which have decreased further since the swelling began. Prior to the swelling, he was more active.  He has a history of diabetes but has not been taking his medication, glipizide , due to concerns about low blood sugar, as he has not been eating regularly.  His energy levels have fluctuated since the surgery, impacting his daily activities. He used to be more active, visiting places regularly, but now his activity is limited.  Patient denies dysuria denies fever chills Appetite has been poor Been losing weight over the past several months dramatic weight loss from April till now Had resection of pituitary growth several months ago  Review of Systems     Objective:   Physical Exam  General-in no acute distress Eyes-no discharge Lungs-respiratory rate normal, CTA CV-no murmurs,RRR Extremities skin warm dry no edema Neuro grossly normal Behavior normal, alert Abdomen is soft but some subjective discomfort in the mid abdomen lower abdomen Epididymitis noted on the right side with redness and swelling does not appear to be  gangrene     06/09/2024    1:57 PM 05/18/2024    3:06 PM 01/08/2024    3:24 PM 12/11/2023    9:11 AM 12/07/2023    3:58 PM  Depression screen PHQ 2/9  Decreased Interest 2 0 1 1 2   Down, Depressed, Hopeless 2 0 0 1 2  PHQ - 2 Score 4 0 1 2 4   Altered sleeping 2 2 1 2 2   Tired, decreased energy 3 0 1 2 2   Change in appetite 3 0 1 2 3   Feeling bad or failure about yourself  2 0 0 0 0  Trouble concentrating 0 0 0 1 2  Moving slowly or fidgety/restless 2 0 0 0 2  Suicidal thoughts 0 0 0 0 0  PHQ-9 Score 16 2 4 9 15   Difficult doing work/chores Very difficult Not difficult at all Not difficult at all Somewhat difficult Very difficult       Assessment & Plan:  1. Immunization due (Primary) Today - Flu vaccine HIGH DOSE PF(Fluzone Trivalent)  2. Abdominal discomfort CAT scan ordered because of unintentional weight loss need to rule out possibility of growths and cancer Patient thinks he may have a kidney stone his urine under the microscope showed WBCs but did not show or RBCs - CT ABDOMEN PELVIS W WO CONTRAST - Prolactin - TSH + free T4 - Cortisol - POCT URINALYSIS DIP (CLINITEK) - Urine Culture  3. Unintentional weight loss There is also a strong probability that he has problems going  on due to pituitary surgery we will do additional lab work - Prolactin - TSH + free T4 - CBC with Differential  4. Primary hypertension Blood pressure continue current medicine at a lower dose - losartan  (COZAAR ) 25 MG tablet; Take 1 tablet (25 mg total) by mouth daily.  Dispense: 90 tablet; Refill: 1 - Comprehensive metabolic panel  5. Pituitary macroadenoma (HCC) Comprehensive lab work ordered await results - Comprehensive metabolic panel  6. Epididymitis Antibiotics Levaquin  for 10 days ordered warning signs regarding infection discussed - CT ABDOMEN PELVIS W WO CONTRAST - Prolactin - CBC with Differential - Comprehensive metabolic panel  7. Hyperlipidemia associated with type 2  diabetes mellitus (HCC) Stable currently - Comprehensive metabolic panel  Major depression moderate we will start low-dose Prozac I will wait to start this until after his current lab testing and CAT scan he has a follow-up visit next week we will discuss

## 2024-06-10 ENCOUNTER — Ambulatory Visit (HOSPITAL_COMMUNITY)
Admission: RE | Admit: 2024-06-10 | Discharge: 2024-06-10 | Disposition: A | Discharge status: Home / Self Care | Source: Ambulatory Visit | Attending: Family Medicine | Admitting: Family Medicine

## 2024-06-10 DIAGNOSIS — N451 Epididymitis: Secondary | ICD-10-CM | POA: Diagnosis not present

## 2024-06-10 DIAGNOSIS — I10 Essential (primary) hypertension: Secondary | ICD-10-CM | POA: Diagnosis not present

## 2024-06-10 DIAGNOSIS — R109 Unspecified abdominal pain: Secondary | ICD-10-CM | POA: Diagnosis not present

## 2024-06-10 DIAGNOSIS — E785 Hyperlipidemia, unspecified: Secondary | ICD-10-CM | POA: Diagnosis not present

## 2024-06-10 DIAGNOSIS — K8020 Calculus of gallbladder without cholecystitis without obstruction: Secondary | ICD-10-CM | POA: Diagnosis not present

## 2024-06-10 DIAGNOSIS — D352 Benign neoplasm of pituitary gland: Secondary | ICD-10-CM | POA: Diagnosis not present

## 2024-06-10 DIAGNOSIS — R634 Abnormal weight loss: Secondary | ICD-10-CM | POA: Diagnosis not present

## 2024-06-10 DIAGNOSIS — K5730 Diverticulosis of large intestine without perforation or abscess without bleeding: Secondary | ICD-10-CM | POA: Diagnosis not present

## 2024-06-10 DIAGNOSIS — E1169 Type 2 diabetes mellitus with other specified complication: Secondary | ICD-10-CM | POA: Diagnosis not present

## 2024-06-10 DIAGNOSIS — N2882 Megaloureter: Secondary | ICD-10-CM | POA: Diagnosis not present

## 2024-06-10 DIAGNOSIS — N400 Enlarged prostate without lower urinary tract symptoms: Secondary | ICD-10-CM | POA: Diagnosis not present

## 2024-06-10 MED ORDER — IOHEXOL 9 MG/ML PO SOLN
500.0000 mL | ORAL | Status: AC
  Administered 2024-06-10: 500 mL via ORAL

## 2024-06-10 MED ORDER — IOHEXOL 300 MG/ML  SOLN
100.0000 mL | Freq: Once | INTRAMUSCULAR | Status: AC | PRN
  Administered 2024-06-10: 100 mL via INTRAVENOUS

## 2024-06-11 LAB — CBC WITH DIFFERENTIAL/PLATELET
Basophils Absolute: 0.1 x10E3/uL (ref 0.0–0.2)
Basos: 2 %
EOS (ABSOLUTE): 0.5 x10E3/uL — ABNORMAL HIGH (ref 0.0–0.4)
Eos: 9 %
Hematocrit: 37 % — ABNORMAL LOW (ref 37.5–51.0)
Hemoglobin: 11.8 g/dL — ABNORMAL LOW (ref 13.0–17.7)
Immature Grans (Abs): 0 x10E3/uL (ref 0.0–0.1)
Immature Granulocytes: 0 %
Lymphocytes Absolute: 0.8 x10E3/uL (ref 0.7–3.1)
Lymphs: 15 %
MCH: 28.7 pg (ref 26.6–33.0)
MCHC: 31.9 g/dL (ref 31.5–35.7)
MCV: 90 fL (ref 79–97)
Monocytes Absolute: 0.5 x10E3/uL (ref 0.1–0.9)
Monocytes: 8 %
Neutrophils Absolute: 3.6 x10E3/uL (ref 1.4–7.0)
Neutrophils: 65 %
Platelets: 322 x10E3/uL (ref 150–450)
RBC: 4.11 x10E6/uL — ABNORMAL LOW (ref 4.14–5.80)
RDW: 15.5 % — ABNORMAL HIGH (ref 11.6–15.4)
WBC: 5.5 x10E3/uL (ref 3.4–10.8)

## 2024-06-11 LAB — COMPREHENSIVE METABOLIC PANEL WITH GFR
ALT: 9 IU/L (ref 0–44)
AST: 11 IU/L (ref 0–40)
Albumin: 3.8 g/dL — ABNORMAL LOW (ref 3.9–4.9)
Alkaline Phosphatase: 93 IU/L (ref 44–121)
BUN/Creatinine Ratio: 21 (ref 10–24)
BUN: 24 mg/dL (ref 8–27)
Bilirubin Total: 0.4 mg/dL (ref 0.0–1.2)
CO2: 22 mmol/L (ref 20–29)
Calcium: 9 mg/dL (ref 8.6–10.2)
Chloride: 101 mmol/L (ref 96–106)
Creatinine, Ser: 1.17 mg/dL (ref 0.76–1.27)
Globulin, Total: 2.4 g/dL (ref 1.5–4.5)
Glucose: 93 mg/dL (ref 70–99)
Potassium: 4.3 mmol/L (ref 3.5–5.2)
Sodium: 138 mmol/L (ref 134–144)
Total Protein: 6.2 g/dL (ref 6.0–8.5)
eGFR: 67 mL/min/1.73 (ref >59)

## 2024-06-11 LAB — PROLACTIN: Prolactin: 4.9 ng/mL (ref 3.6–25.2)

## 2024-06-11 LAB — TSH+FREE T4
Free T4: 1.11 ng/dL (ref 0.82–1.77)
TSH: 3.05 u[IU]/mL (ref 0.450–4.500)

## 2024-06-11 LAB — CORTISOL: Cortisol: 13.1 ug/dL (ref 6.2–19.4)

## 2024-06-14 ENCOUNTER — Ambulatory Visit (INDEPENDENT_AMBULATORY_CARE_PROVIDER_SITE_OTHER): Admitting: Family Medicine

## 2024-06-14 ENCOUNTER — Encounter: Payer: Self-pay | Admitting: Family Medicine

## 2024-06-14 VITALS — BP 94/70 | HR 65 | Temp 97.4°F | Ht 72.0 in | Wt 177.0 lb

## 2024-06-14 DIAGNOSIS — I10 Essential (primary) hypertension: Secondary | ICD-10-CM

## 2024-06-14 DIAGNOSIS — D352 Benign neoplasm of pituitary gland: Secondary | ICD-10-CM | POA: Diagnosis not present

## 2024-06-14 DIAGNOSIS — R634 Abnormal weight loss: Secondary | ICD-10-CM | POA: Diagnosis not present

## 2024-06-14 LAB — SPECIMEN STATUS REPORT

## 2024-06-14 LAB — URINE CULTURE

## 2024-06-14 MED ORDER — LEVOFLOXACIN 500 MG PO TABS: 500.0000 mg | ORAL_TABLET | Freq: Every day | ORAL | 0 refills | Status: DC

## 2024-06-14 MED ORDER — PRAVASTATIN SODIUM 40 MG PO TABS: 40.0000 mg | ORAL_TABLET | Freq: Every day | ORAL | 3 refills | Status: AC

## 2024-06-14 MED ORDER — ALBUTEROL SULFATE HFA 108 (90 BASE) MCG/ACT IN AERS: INHALATION_SPRAY | RESPIRATORY_TRACT | 12 refills | Status: AC

## 2024-06-14 MED ORDER — DIPHENOXYLATE-ATROPINE 2.5-0.025 MG PO TABS: ORAL_TABLET | ORAL | 5 refills | Status: AC

## 2024-06-14 MED ORDER — OMEPRAZOLE 20 MG PO CPDR: DELAYED_RELEASE_CAPSULE | ORAL | 0 refills | Status: DC

## 2024-06-14 MED ORDER — LOSARTAN POTASSIUM 25 MG PO TABS: 25.0000 mg | ORAL_TABLET | Freq: Every day | ORAL | 1 refills | Status: AC

## 2024-06-14 MED ORDER — SUCRALFATE 1 G PO TABS: ORAL_TABLET | ORAL | 1 refills | Status: AC

## 2024-06-14 NOTE — Progress Notes (Signed)
   Subjective:    Patient ID: Roger Phillips Prudent, male    DOB: 11-06-1955, 68 y.o.   MRN: [id number]  HPI follow up  Weight loss-actually his weight has stabilized and gone up a few pounds which is a pleasant surprise HTN-blood pressure is on the low end we actually cut back on his medication Pituitary macroadenoma-his lab work came back looking overall okay but this is not my area of expertise so we will touch base with endocrinology and more than likely referral Epididymitis-doing better with this but has an area where the ureter comes to the bladder where there does appear to be some hydronephrosis type appearance we will go ahead with touching base with urology    Review of Systems     Objective:   Physical Exam General-in no acute distress Eyes-no discharge Lungs-respiratory rate normal, CTA CV-no murmurs,RRR Extremities skin warm dry no edema Neuro grossly normal Behavior normal, alert        Assessment & Plan:   1. Primary hypertension Blood pressure on the low end we actually reduce the medicine We will monitor this Follow-up in 3 months - losartan  (COZAAR ) 25 MG tablet; Take 1 tablet (25 mg total) by mouth daily.  Dispense: 90 tablet; Refill: 1  2. Unintentional weight loss (Primary) His wife is supposed to keep up with how his weights are doing and will send us  some updates on how this is going periodically and follow-up in 3 months  3. Pituitary macroadenoma (HCC) I believe it would be reasonable to be seen by endocrinology for further evaluation and we will touch base with them  Abnormal finding on CAT scan although I doubt that this is a tumor but we will touch base with his urologist

## 2024-06-15 NOTE — Progress Notes (Signed)
 Referral placed.

## 2024-06-15 NOTE — Addendum Note (Signed)
 Addended by: Hanif Radin M on: 06/15/2024 10:27 AM   Modules accepted: Orders

## 2024-06-19 ENCOUNTER — Other Ambulatory Visit: Payer: Self-pay | Admitting: Urology

## 2024-06-20 LAB — HM DIABETES EYE EXAM

## 2024-06-21 ENCOUNTER — Other Ambulatory Visit: Payer: Self-pay | Admitting: Family Medicine

## 2024-06-21 MED ORDER — LEVOFLOXACIN 500 MG PO TABS: 500.0000 mg | ORAL_TABLET | Freq: Every day | ORAL | 0 refills | Status: AC

## 2024-06-22 ENCOUNTER — Ambulatory Visit: Admitting: Urology

## 2024-07-15 ENCOUNTER — Ambulatory Visit: Admitting: Nurse Practitioner

## 2024-07-15 VITALS — BP 138/79 | Ht 72.0 in | Wt 182.1 lb

## 2024-07-15 DIAGNOSIS — R634 Abnormal weight loss: Secondary | ICD-10-CM | POA: Diagnosis not present

## 2024-07-16 ENCOUNTER — Encounter: Payer: Self-pay | Admitting: Nurse Practitioner

## 2024-07-16 NOTE — Progress Notes (Signed)
 Subjective:    Patient ID: Roger Phillips, male    DOB: 06-24-1956, 68 y.o.   MRN: [id number]  HPI Discussed the use of AI scribe software for clinical note transcription with the patient, who gave verbal consent to proceed.  History of Present Illness Roger Phillips is a 68 year old male who presents for follow-up on significant weight loss and related health concerns. Wife is present.   He has experienced significant weight loss, dropping from 213 pounds to 176 pounds, during a period of other health issues. His current weight is stable at 180 pounds, but he is concerned about the potential for further weight loss. His appetite has improved, and he is able to eat a variety of foods without experiencing abdominal pain, nausea, or vomiting.  He previously had to stop his diabetes medication due to episodes of hypoglycemia, with blood sugar levels dropping to 40 mg/dL. He managed these episodes by consuming candy with high sugar content. Currently, his blood sugar levels are stable, with a recent reading of 146 mg/dL after eating, and he no longer takes diabetes medication.  He is on a low dose of Cozaar  (losartan ) 25 mg for blood pressure management. He does not frequently check his blood pressure at home, but when he does, it has been stable.  He uses a Wixela inhaler, taking one puff by mouth twice a day for COPD. He finds the inhaler difficult to handle. He also uses albuterol  every four to six hours, which helps with his breathing.  He reports hair loss and believes it may be related to past radiation treatment for prostate cancer two years ago. He has mild anemia, with a recent hemoglobin level of 11.8 g/dL, slightly down from 87.8 g/dL a month ago.      07/15/2024    2:32 PM  Depression screen PHQ 2/9  Decreased Interest 2  Down, Depressed, Hopeless 2  PHQ - 2 Score 4  Altered sleeping 2  Tired, decreased energy 3  Change in appetite 2  Feeling bad or failure about yourself   0  Trouble concentrating 0  Moving slowly or fidgety/restless 0  Suicidal thoughts 0  PHQ-9 Score 11  Difficult doing work/chores Somewhat difficult      07/15/2024    2:33 PM 06/14/2024    3:03 PM 06/09/2024    1:57 PM 01/08/2024    3:24 PM  GAD 7 : Generalized Anxiety Score  Nervous, Anxious, on Edge 2 2 1 1   Control/stop worrying 2 2 0 0  Worry too much - different things 2 2 1  0  Trouble relaxing 0 3 1 1   Restless 0 0 2 0  Easily annoyed or irritable 1 1 0 0  Afraid - awful might happen 0 0 0 0  Total GAD 7 Score 7 10 5 2   Anxiety Difficulty Somewhat difficult Very difficult Very difficult     Social History   Tobacco Use   Smoking status: Every Day    Current packs/day: 1.50    Average packs/day: 1.5 packs/day for 40.0 years (60.0 ttl pk-yrs)    Types: Cigarettes   Smokeless tobacco: Never  Vaping Use   Vaping status: Never Used  Substance Use Topics   Alcohol use: Yes    Alcohol/week: 6.0 standard drinks of alcohol    Types: 6 Shots of liquor per week   Drug use: No          Objective:   Physical Exam Vitals and nursing note  reviewed.  Constitutional:      General: He is not in acute distress. Cardiovascular:     Rate and Rhythm: Normal rate and regular rhythm.  Pulmonary:     Effort: Pulmonary effort is normal.     Breath sounds: Normal breath sounds.  Abdominal:     General: There is no distension.     Palpations: Abdomen is soft.     Tenderness: There is no abdominal tenderness.  Neurological:     Mental Status: He is alert and oriented to person, place, and time.  Psychiatric:        Mood and Affect: Mood normal.        Behavior: Behavior normal.        Thought Content: Thought content normal.    Today's Vitals   07/15/24 1431  BP: 138/79  Weight: 182 lb 1.3 oz (82.6 kg)  Height: 6' (1.829 m)   Body mass index is 24.69 kg/m.  Weight stable.        Assessment & Plan:  1. Unintentional weight loss (Primary) Overall health stable.  Weight stabilized. Appetite improved. Breathing well-managed. Flu vaccination current. - Monitor weight weekly. - Continue current diet and monitor for changes in appetite or weight. Hair loss likely due to previous weight loss and nutritional deficiencies. Discussed potential causes including stress, thyroid  function, and past radiation treatment. - Start biotin supplementation. - Start multivitamin with iron and B vitamins.  Return in about 2 months (around 09/14/2024) for Dr. Glendia wants to see him in December for recheck.

## 2024-09-14 ENCOUNTER — Ambulatory Visit: Admitting: Family Medicine

## 2024-10-03 ENCOUNTER — Other Ambulatory Visit (HOSPITAL_COMMUNITY): Payer: Self-pay | Admitting: Neurosurgery

## 2024-10-03 DIAGNOSIS — D352 Benign neoplasm of pituitary gland: Secondary | ICD-10-CM

## 2024-10-17 ENCOUNTER — Other Ambulatory Visit: Payer: Self-pay | Admitting: Family Medicine

## 2024-12-05 ENCOUNTER — Ambulatory Visit: Admitting: Family Medicine
